# Patient Record
Sex: Male | Born: 1945
Health system: Southern US, Community
[De-identification: ages and names within clinical notes are randomized; demographics above are authoritative.]

## PROBLEM LIST (undated history)

## (undated) DIAGNOSIS — J309 Allergic rhinitis, unspecified: Secondary | ICD-10-CM

## (undated) DIAGNOSIS — E785 Hyperlipidemia, unspecified: Secondary | ICD-10-CM

## (undated) DIAGNOSIS — T7840XA Allergy, unspecified, initial encounter: Secondary | ICD-10-CM

## (undated) DIAGNOSIS — F4312 Post-traumatic stress disorder, chronic: Secondary | ICD-10-CM

## (undated) DIAGNOSIS — G4730 Sleep apnea, unspecified: Secondary | ICD-10-CM

## (undated) DIAGNOSIS — F32A Depression, unspecified: Secondary | ICD-10-CM

## (undated) DIAGNOSIS — H269 Unspecified cataract: Secondary | ICD-10-CM

## (undated) DIAGNOSIS — M1990 Unspecified osteoarthritis, unspecified site: Secondary | ICD-10-CM

## (undated) DIAGNOSIS — E119 Type 2 diabetes mellitus without complications: Secondary | ICD-10-CM

## (undated) DIAGNOSIS — G4733 Obstructive sleep apnea (adult) (pediatric): Principal | ICD-10-CM

## (undated) DIAGNOSIS — F419 Anxiety disorder, unspecified: Secondary | ICD-10-CM

## (undated) DIAGNOSIS — F329 Major depressive disorder, single episode, unspecified: Secondary | ICD-10-CM

## (undated) HISTORY — PX: WISDOM TOOTH EXTRACTION: SHX21

## (undated) HISTORY — DX: Unspecified cataract: H26.9

## (undated) HISTORY — DX: Allergy, unspecified, initial encounter: T78.40XA

## (undated) HISTORY — DX: Obstructive sleep apnea (adult) (pediatric): G47.33

## (undated) HISTORY — DX: Unspecified osteoarthritis, unspecified site: M19.90

## (undated) HISTORY — DX: Sleep apnea, unspecified: G47.30

## (undated) HISTORY — PX: EYE SURGERY: SHX253

## (undated) HISTORY — DX: Type 2 diabetes mellitus without complications: E11.9

## (undated) HISTORY — DX: Allergic rhinitis, unspecified: J30.9

## (undated) HISTORY — DX: Major depressive disorder, single episode, unspecified: F32.9

## (undated) HISTORY — PX: POLYPECTOMY: SHX149

## (undated) HISTORY — DX: Hyperlipidemia, unspecified: E78.5

## (undated) HISTORY — DX: Post-traumatic stress disorder, chronic: F43.12

## (undated) HISTORY — DX: Anxiety disorder, unspecified: F41.9

## (undated) HISTORY — DX: Depression, unspecified: F32.A

## (undated) HISTORY — PX: COLONOSCOPY: SHX174

## (undated) HISTORY — PX: NO PAST SURGERIES: SHX2092

---

## 2002-04-12 ENCOUNTER — Emergency Department (HOSPITAL_COMMUNITY): Admission: EM | Admit: 2002-04-12 | Discharge: 2002-04-12 | Payer: Self-pay | Admitting: Emergency Medicine

## 2002-04-12 ENCOUNTER — Encounter: Payer: Self-pay | Admitting: Emergency Medicine

## 2003-05-15 ENCOUNTER — Encounter: Admission: RE | Admit: 2003-05-15 | Discharge: 2003-05-15 | Payer: Self-pay | Admitting: Internal Medicine

## 2004-04-12 LAB — HM COLONOSCOPY

## 2004-07-21 ENCOUNTER — Encounter: Admission: RE | Admit: 2004-07-21 | Discharge: 2004-10-19 | Payer: Self-pay | Admitting: Internal Medicine

## 2004-07-29 ENCOUNTER — Ambulatory Visit: Payer: Self-pay | Admitting: Gastroenterology

## 2004-08-03 ENCOUNTER — Ambulatory Visit: Payer: Self-pay | Admitting: Gastroenterology

## 2006-06-14 ENCOUNTER — Ambulatory Visit: Payer: Self-pay | Admitting: Family Medicine

## 2006-06-21 ENCOUNTER — Ambulatory Visit: Payer: Self-pay | Admitting: Family Medicine

## 2006-06-21 LAB — CONVERTED CEMR LAB
ALT: 17 units/L (ref 0–40)
AST: 20 units/L (ref 0–37)
BUN: 13 mg/dL (ref 6–23)
Cholesterol: 202 mg/dL (ref 0–200)
Creatinine, Ser: 1.1 mg/dL (ref 0.4–1.5)
Creatinine,U: 131.7 mg/dL
Direct LDL: 127.1 mg/dL
GFR calc Af Amer: 88 mL/min
HDL: 58 mg/dL (ref >39.0)
Microalb, Ur: 0.3 mg/dL (ref 0.0–1.9)
Potassium: 4.3 meq/L (ref 3.5–5.1)
Sodium: 142 meq/L (ref 135–145)
Triglycerides: 74 mg/dL (ref 0–149)
VLDL: 15 mg/dL (ref 0–40)

## 2006-08-24 ENCOUNTER — Ambulatory Visit: Payer: Self-pay | Admitting: Family Medicine

## 2006-08-24 ENCOUNTER — Encounter (INDEPENDENT_AMBULATORY_CARE_PROVIDER_SITE_OTHER): Payer: Self-pay | Admitting: Family Medicine

## 2006-08-24 ENCOUNTER — Encounter: Payer: Self-pay | Admitting: Family Medicine

## 2006-08-24 DIAGNOSIS — E1151 Type 2 diabetes mellitus with diabetic peripheral angiopathy without gangrene: Secondary | ICD-10-CM

## 2006-08-24 DIAGNOSIS — E785 Hyperlipidemia, unspecified: Secondary | ICD-10-CM

## 2006-08-24 LAB — CONVERTED CEMR LAB
AST: 19 units/L (ref 0–37)
Cholesterol: 211 mg/dL (ref 0–200)
Glucose, Bld: 108 mg/dL — ABNORMAL HIGH (ref 70–99)
HDL: 58.2 mg/dL (ref >39.0)
Total CHOL/HDL Ratio: 3.6
VLDL: 27 mg/dL (ref 0–40)

## 2006-08-25 ENCOUNTER — Telehealth (INDEPENDENT_AMBULATORY_CARE_PROVIDER_SITE_OTHER): Payer: Self-pay | Admitting: *Deleted

## 2007-08-03 ENCOUNTER — Encounter (INDEPENDENT_AMBULATORY_CARE_PROVIDER_SITE_OTHER): Payer: Self-pay | Admitting: *Deleted

## 2007-08-03 ENCOUNTER — Telehealth (INDEPENDENT_AMBULATORY_CARE_PROVIDER_SITE_OTHER): Payer: Self-pay | Admitting: *Deleted

## 2007-08-03 ENCOUNTER — Ambulatory Visit: Payer: Self-pay | Admitting: Family Medicine

## 2007-08-03 DIAGNOSIS — J209 Acute bronchitis, unspecified: Secondary | ICD-10-CM

## 2007-08-03 HISTORY — DX: Acute bronchitis, unspecified: J20.9

## 2007-08-03 LAB — CONVERTED CEMR LAB
Inflenza A Ag: NEGATIVE
Rapid Strep: NEGATIVE

## 2007-08-08 ENCOUNTER — Telehealth (INDEPENDENT_AMBULATORY_CARE_PROVIDER_SITE_OTHER): Payer: Self-pay | Admitting: *Deleted

## 2007-08-15 ENCOUNTER — Telehealth (INDEPENDENT_AMBULATORY_CARE_PROVIDER_SITE_OTHER): Payer: Self-pay | Admitting: *Deleted

## 2007-08-16 ENCOUNTER — Encounter: Payer: Self-pay | Admitting: Family Medicine

## 2007-08-30 ENCOUNTER — Ambulatory Visit: Payer: Self-pay | Admitting: Internal Medicine

## 2007-10-10 ENCOUNTER — Telehealth (INDEPENDENT_AMBULATORY_CARE_PROVIDER_SITE_OTHER): Payer: Self-pay | Admitting: *Deleted

## 2007-10-16 ENCOUNTER — Ambulatory Visit: Payer: Self-pay | Admitting: Family Medicine

## 2007-10-16 DIAGNOSIS — F329 Major depressive disorder, single episode, unspecified: Secondary | ICD-10-CM

## 2007-10-16 DIAGNOSIS — F341 Dysthymic disorder: Secondary | ICD-10-CM

## 2007-10-16 DIAGNOSIS — F411 Generalized anxiety disorder: Secondary | ICD-10-CM

## 2007-10-16 HISTORY — DX: Dysthymic disorder: F34.1

## 2007-10-16 HISTORY — DX: Generalized anxiety disorder: F41.1

## 2007-10-19 ENCOUNTER — Telehealth (INDEPENDENT_AMBULATORY_CARE_PROVIDER_SITE_OTHER): Payer: Self-pay | Admitting: *Deleted

## 2007-10-20 ENCOUNTER — Encounter (INDEPENDENT_AMBULATORY_CARE_PROVIDER_SITE_OTHER): Payer: Self-pay | Admitting: *Deleted

## 2007-10-25 ENCOUNTER — Telehealth (INDEPENDENT_AMBULATORY_CARE_PROVIDER_SITE_OTHER): Payer: Self-pay | Admitting: *Deleted

## 2007-10-26 ENCOUNTER — Encounter (INDEPENDENT_AMBULATORY_CARE_PROVIDER_SITE_OTHER): Payer: Self-pay | Admitting: *Deleted

## 2007-10-26 LAB — CONVERTED CEMR LAB
AST: 22 units/L (ref 0–37)
Albumin: 3.5 g/dL (ref 3.5–5.2)
BUN: 12 mg/dL (ref 6–23)
Basophils Absolute: 0 10*3/uL (ref 0.0–0.1)
Basophils Relative: 0.2 % (ref 0.0–1.0)
Calcium: 8.9 mg/dL (ref 8.4–10.5)
Chloride: 105 meq/L (ref 96–112)
Cholesterol: 187 mg/dL (ref 0–200)
Creatinine, Ser: 1 mg/dL (ref 0.4–1.5)
Creatinine,U: 112 mg/dL
Eosinophils Absolute: 0.1 10*3/uL (ref 0.0–0.7)
Eosinophils Relative: 4.2 % (ref 0.0–5.0)
GFR calc Af Amer: 98 mL/min
GFR calc non Af Amer: 81 mL/min
HCT: 41.1 % (ref 39.0–52.0)
Hemoglobin: 14 g/dL (ref 13.0–17.0)
Hgb A1c MFr Bld: 6.5 % — ABNORMAL HIGH (ref 4.6–6.0)
MCHC: 34.1 g/dL (ref 30.0–36.0)
MCV: 84.6 fL (ref 78.0–100.0)
Microalb, Ur: 0.2 mg/dL (ref 0.0–1.9)
Monocytes Absolute: 0.3 10*3/uL (ref 0.1–1.0)
Neutro Abs: 1.5 10*3/uL (ref 1.4–7.7)
RBC: 4.86 M/uL (ref 4.22–5.81)
WBC: 2.8 10*3/uL — ABNORMAL LOW (ref 4.5–10.5)

## 2007-11-06 ENCOUNTER — Ambulatory Visit: Payer: Self-pay | Admitting: Family Medicine

## 2007-11-21 ENCOUNTER — Encounter: Payer: Self-pay | Admitting: Family Medicine

## 2007-11-24 ENCOUNTER — Encounter: Payer: Self-pay | Admitting: Family Medicine

## 2007-12-05 ENCOUNTER — Ambulatory Visit: Payer: Self-pay | Admitting: Family Medicine

## 2007-12-05 DIAGNOSIS — R609 Edema, unspecified: Secondary | ICD-10-CM

## 2007-12-05 HISTORY — DX: Edema, unspecified: R60.9

## 2007-12-15 ENCOUNTER — Encounter: Payer: Self-pay | Admitting: Family Medicine

## 2007-12-20 ENCOUNTER — Ambulatory Visit: Payer: Self-pay | Admitting: Family Medicine

## 2007-12-20 DIAGNOSIS — R413 Other amnesia: Secondary | ICD-10-CM

## 2007-12-26 ENCOUNTER — Encounter: Admission: RE | Admit: 2007-12-26 | Discharge: 2007-12-26 | Payer: Self-pay | Admitting: Family Medicine

## 2007-12-27 ENCOUNTER — Telehealth (INDEPENDENT_AMBULATORY_CARE_PROVIDER_SITE_OTHER): Payer: Self-pay | Admitting: *Deleted

## 2007-12-27 LAB — CONVERTED CEMR LAB
BUN: 14 mg/dL (ref 6–23)
Basophils Absolute: 0 10*3/uL (ref 0.0–0.1)
Creatinine, Ser: 1.3 mg/dL (ref 0.4–1.5)
GFR calc Af Amer: 72 mL/min
GFR calc non Af Amer: 60 mL/min
Hemoglobin: 14.4 g/dL (ref 13.0–17.0)
Lymphocytes Relative: 31 % (ref 12.0–46.0)
MCHC: 34 g/dL (ref 30.0–36.0)
Monocytes Absolute: 0.3 10*3/uL (ref 0.1–1.0)
Neutro Abs: 2.2 10*3/uL (ref 1.4–7.7)
Platelets: 225 10*3/uL (ref 150–400)
Potassium: 4.2 meq/L (ref 3.5–5.1)
RDW: 13 % (ref 11.5–14.6)
Vitamin B-12: 445 pg/mL (ref 211–911)

## 2007-12-28 ENCOUNTER — Encounter (INDEPENDENT_AMBULATORY_CARE_PROVIDER_SITE_OTHER): Payer: Self-pay | Admitting: *Deleted

## 2008-01-16 ENCOUNTER — Telehealth (INDEPENDENT_AMBULATORY_CARE_PROVIDER_SITE_OTHER): Payer: Self-pay | Admitting: *Deleted

## 2008-01-18 ENCOUNTER — Ambulatory Visit: Payer: Self-pay | Admitting: Family Medicine

## 2008-01-18 DIAGNOSIS — R42 Dizziness and giddiness: Secondary | ICD-10-CM | POA: Insufficient documentation

## 2008-01-18 HISTORY — DX: Dizziness and giddiness: R42

## 2008-01-23 ENCOUNTER — Ambulatory Visit: Payer: Self-pay | Admitting: Family Medicine

## 2008-01-23 DIAGNOSIS — R0789 Other chest pain: Secondary | ICD-10-CM

## 2008-01-23 HISTORY — DX: Other chest pain: R07.89

## 2008-01-23 LAB — CONVERTED CEMR LAB
Nitrite: NEGATIVE
Urobilinogen, UA: NEGATIVE

## 2008-01-29 ENCOUNTER — Encounter (INDEPENDENT_AMBULATORY_CARE_PROVIDER_SITE_OTHER): Payer: Self-pay | Admitting: *Deleted

## 2008-01-29 LAB — CONVERTED CEMR LAB
AST: 20 units/L (ref 0–37)
Albumin: 3.4 g/dL — ABNORMAL LOW (ref 3.5–5.2)
Alkaline Phosphatase: 59 units/L (ref 39–117)
BUN: 13 mg/dL (ref 6–23)
Basophils Relative: 0.7 % (ref 0.0–3.0)
Bilirubin, Direct: 0.1 mg/dL (ref 0.0–0.3)
Chloride: 108 meq/L (ref 96–112)
GFR calc non Af Amer: 81 mL/min
Hemoglobin: 13.4 g/dL (ref 13.0–17.0)
Hgb A1c MFr Bld: 6.7 % — ABNORMAL HIGH (ref 4.6–6.0)
LDL Cholesterol: 122 mg/dL — ABNORMAL HIGH (ref 0–99)
Lymphocytes Relative: 24.3 % (ref 12.0–46.0)
Monocytes Relative: 7.1 % (ref 3.0–12.0)
Neutro Abs: 2.3 10*3/uL (ref 1.4–7.7)
Potassium: 4.1 meq/L (ref 3.5–5.1)
RBC: 4.54 M/uL (ref 4.22–5.81)
RDW: 13.6 % (ref 11.5–14.6)
TSH: 1.2 microintl units/mL (ref 0.35–5.50)
Total CHOL/HDL Ratio: 4
Vitamin B-12: 396 pg/mL (ref 211–911)

## 2008-02-08 ENCOUNTER — Ambulatory Visit: Payer: Self-pay

## 2008-02-08 ENCOUNTER — Encounter: Payer: Self-pay | Admitting: Family Medicine

## 2008-02-13 ENCOUNTER — Encounter (INDEPENDENT_AMBULATORY_CARE_PROVIDER_SITE_OTHER): Payer: Self-pay | Admitting: *Deleted

## 2009-05-05 ENCOUNTER — Ambulatory Visit: Payer: Self-pay | Admitting: Family Medicine

## 2009-05-06 ENCOUNTER — Encounter: Payer: Self-pay | Admitting: Family Medicine

## 2009-05-13 LAB — CONVERTED CEMR LAB
Albumin: 3.9 g/dL (ref 3.5–5.2)
BUN: 14 mg/dL (ref 6–23)
Cholesterol: 194 mg/dL (ref 0–200)
Creatinine, Ser: 1.1 mg/dL (ref 0.4–1.5)
GFR calc non Af Amer: 87.34 mL/min (ref >60)
Glucose, Bld: 90 mg/dL (ref 70–99)
HDL: 59.3 mg/dL (ref >39.00)
Hemoglobin: 14.9 g/dL (ref 13.0–17.0)
LDL Cholesterol: 114 mg/dL — ABNORMAL HIGH (ref 0–99)
Lymphocytes Relative: 41 % (ref 12–46)
Lymphs Abs: 1 10*3/uL (ref 0.7–4.0)
MCHC: 33 g/dL (ref 30.0–36.0)
Monocytes Absolute: 0.3 10*3/uL (ref 0.1–1.0)
Monocytes Relative: 11 % (ref 3–12)
Neutro Abs: 1 10*3/uL — ABNORMAL LOW (ref 1.7–7.7)
PSA: 1.14 ng/mL (ref 0.10–4.00)
RBC: 5.21 M/uL (ref 4.22–5.81)
TSH: 0.78 microintl units/mL (ref 0.35–5.50)
Total Bilirubin: 1.3 mg/dL — ABNORMAL HIGH (ref 0.3–1.2)
Triglycerides: 102 mg/dL (ref 0.0–149.0)
VLDL: 20.4 mg/dL (ref 0.0–40.0)
WBC: 2.3 10*3/uL — ABNORMAL LOW (ref 4.0–10.5)

## 2010-05-12 NOTE — Assessment & Plan Note (Signed)
Summary: CPX//PH   Vital Signs:  Patient profile:   65 year old male Height:      68.25 inches Weight:      240 pounds BMI:     36.36 Temp:     98.2 degrees F oral Pulse rate:   82 / minute Pulse rhythm:   regular BP sitting:   122 / 80  (left arm) Cuff size:   large  Vitals Entered By: Army Fossa CMA (May 05, 2009 1:09 PM) CC: CPX.    History of Present Illness: Pt here for cpe and labs.  Pt is seeing a "spine specialist"--- Dr Noel Gerold for ruptured discs.     Type 1 diabetes mellitus follow-up      This is a 65 year old man who presents with Type 2 diabetes mellitus follow-up.  The patient denies polyuria, polydipsia, blurred vision, self managed hypoglycemia, hypoglycemia requiring help, weight loss, weight gain, and numbness of extremities.  The patient denies the following symptoms: neuropathic pain, chest pain, vomiting, orthostatic symptoms, poor wound healing, intermittent claudication, vision loss, and foot ulcer.  Since the last visit the patient reports poor dietary compliance, compliance with medications, not exercising regularly, and not monitoring blood glucose.  Since the last visit, the patient reports having had no eye care and no foot care.    Hyperlipidemia follow-up      The patient also presents for Hyperlipidemia follow-up.  The patient denies muscle aches, GI upset, abdominal pain, flushing, itching, constipation, diarrhea, and fatigue.  The patient denies the following symptoms: chest pain/pressure, exercise intolerance, dypsnea, palpitations, syncope, and pedal edema.  Compliance with medications (by patient report) has been near 100%.  Dietary compliance has been fair.  The patient reports no exercise.    Preventive Screening-Counseling & Management  Alcohol-Tobacco     Alcohol drinks/day: <1     Alcohol type: 4 oz anything     Smoking Status: quit > 6 months     Smoke Cessation Stage: quit     Packs/Day: 2.0     Year Started: 1969     Year Quit:  1982  Caffeine-Diet-Exercise     Caffeine use/day: 0     Does Patient Exercise: no  Hep-HIV-STD-Contraception     Dental Visit-last 6 months no     Dental Care Counseling: to seek dental care; no dental care within six months  Current Medications (verified): 1)  Lamotrigine 100 Mg Tabs (Lamotrigine) .... Take 1 and 1/2 Tablets By Mouth Daily. 2)  Zipsor 25 Mg Caps (Diclofenac Potassium) .... Qid As Needed.  Allergies (verified): No Known Drug Allergies  Past History:  Past Medical History: Last updated: 10/16/2007 Diabetes mellitus, type II Hyperlipidemia Depression Anxiety with panic attacks  Family History: Last updated: 05/05/2009 Family History of Prostate CA 1st degree relative <50 F--PE, prostate CA Family History Hypertension Family History of Stroke F 1st degree relative <60 Sister-- ? cancer S-PE Family History of CAD Male 1st degree relative <60 Family History of Thromboembolism clotting disorder  Social History: Last updated: 05/05/2009 occupation-- unemployed- working on disablity Married Alcohol use-yes Drug use-no Regular exercise-no  Risk Factors: Alcohol Use: <1 (05/05/2009) Caffeine Use: 0 (05/05/2009) Exercise: no (05/05/2009)  Risk Factors: Smoking Status: quit > 6 months (05/05/2009) Packs/Day: 2.0 (05/05/2009)  Family History: Reviewed history and no changes required. Family History of Prostate CA 1st degree relative <50 F--PE, prostate CA Family History Hypertension Family History of Stroke F 1st degree relative <60 Sister-- ? cancer S-PE Family History  of CAD Male 1st degree relative <60 Family History of Thromboembolism clotting disorder  Social History: Reviewed history from 10/16/2007 and no changes required. 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 Eyes:  Denies blurring, discharge, double vision, eye irritation, eye pain, halos, itching, light sensitivity, red eye, vision loss-1 eye, and vision loss-both eyes; + glasses. ENT:  Denies decreased hearing, difficulty swallowing, ear discharge, earache, hoarseness, nasal congestion, nosebleeds, postnasal drainage, ringing in ears, sinus pressure, and sore throat. CV:  Denies bluish discoloration of lips or nails, chest pain or discomfort, difficulty breathing at night, difficulty breathing while lying down, fainting, fatigue, leg cramps with exertion, lightheadness, near fainting, palpitations, shortness of breath with exertion, swelling of feet, swelling of hands, and weight gain. Resp:  Denies chest discomfort, chest pain with inspiration, cough, coughing up blood, excessive snoring, hypersomnolence, morning headaches, pleuritic, shortness of breath, sputum productive, and wheezing. GI:  Denies abdominal pain, bloody stools, change in bowel habits, constipation, dark tarry stools, diarrhea, excessive appetite, gas, hemorrhoids, indigestion, and loss of appetite. GU:  Denies decreased libido, discharge, dysuria, erectile dysfunction, genital sores, hematuria, incontinence, nocturia, urinary frequency, and urinary hesitancy. MS:  Complains of joint pain, loss of strength, and low back pain; denies joint redness, joint swelling, mid back pain, muscle aches, muscle , cramps, muscle weakness, stiffness, and thoracic pain. Derm:  Denies changes in color of skin, changes in nail beds, dryness, excessive perspiration, flushing, hair loss, insect bite(s), itching, lesion(s), poor wound healing, and rash. Neuro:  Denies brief paralysis, difficulty with concentration, disturbances in coordination, falling down, headaches, inability to speak, memory loss,  numbness, poor balance, seizures, sensation of room spinning, tingling, tremors, visual disturbances, and weakness. Psych:  Denies alternate hallucination ( auditory/visual), anxiety, depression, easily angered, easily tearful, irritability, mental problems, panic attacks, sense of great danger, suicidal thoughts/plans, thoughts of violence, unusual visions or sounds, and thoughts /plans of harming others. Endo:  Denies cold intolerance, excessive hunger, excessive thirst, excessive urination, heat intolerance, polyuria, and weight change. Heme:  Denies abnormal bruising, bleeding, enlarge lymph nodes, fevers, pallor, and skin discoloration.  Physical Exam  General:  Well-developed,well-nourished,in no acute distress; alert,appropriate and cooperative throughout examination Head:  Normocephalic and atraumatic without obvious abnormalities. No apparent alopecia or balding. Eyes:  pupils equal, pupils round, pupils reactive to light, and corneas and lenses clear.   Ears:  External ear exam shows no significant lesions or deformities.  Otoscopic examination reveals clear canals, tympanic membranes are intact bilaterally without bulging, retraction, inflammation or discharge. Hearing is grossly normal bilaterally. Nose:  External nasal examination shows no deformity or inflammation. Nasal mucosa are pink and moist without lesions or exudates. Mouth:  Oral mucosa and oropharynx without lesions or exudates.  Teeth in good repair. Neck:  No deformities, masses, or tenderness noted. Chest Wall:  No deformities, masses, tenderness or gynecomastia noted. Lungs:  Normal respiratory effort, chest expands symmetrically. Lungs are clear to auscultation, no crackles or wheezes. Heart:  normal rate and no murmur.   Abdomen:  Bowel sounds positive,abdomen soft and non-tender without masses, organomegaly or hernias noted. Rectal:  REFUSED---SECONDARY TO SEVERE BACK PAIN Genitalia:  Testes bilaterally descended  without nodularity, tenderness  or masses. No scrotal masses or lesions. No penis lesions or urethral discharge. Prostate:  REFUSED--SEE ABOVE Msk:  WEAKNESS IN BOTH LEGS no joint swelling, no joint warmth, and no joint deformities.   Extremities:  No clubbing, cyanosis, edema, or deformity noted with normal full range of motion of all joints.   Neurologic:  alert & oriented  SEE ABOVE Skin:  Intact without suspicious lesions or rashes Cervical Nodes:  No lymphadenopathy noted Psych:  Oriented X3 and normally interactive.     Impression & Recommendations:  Problem # 1:  PREVENTIVE HEALTH CARE (ICD-V70.0)  Orders: EKG w/ Interpretation (93000) Venipuncture (16109) TLB-Lipid Panel (80061-LIPID) TLB-BMP (Basic Metabolic Panel-BMET) (80048-METABOL) TLB-CBC Platelet - w/Differential (85025-CBCD) TLB-Hepatic/Liver Function Pnl (80076-HEPATIC) TLB-TSH (Thyroid Stimulating Hormone) (84443-TSH) TLB-PSA (Prostate Specific Antigen) (84153-PSA)  Problem # 2:  HYPERLIPIDEMIA (ICD-272.4)  The following medications were removed from the medication list:    Zocor 80 Mg Tabs (Simvastatin) .Marland Kitchen... Take one tablet every evening at bedtime.  Orders: EKG w/ Interpretation (93000) Venipuncture (60454) TLB-Lipid Panel (80061-LIPID) TLB-BMP (Basic Metabolic Panel-BMET) (80048-METABOL) TLB-CBC Platelet - w/Differential (85025-CBCD) TLB-Hepatic/Liver Function Pnl (80076-HEPATIC) TLB-TSH (Thyroid Stimulating Hormone) (84443-TSH) TLB-PSA (Prostate Specific Antigen) (84153-PSA)  Problem # 3:  DIABETES MELLITUS, TYPE II (ICD-250.00)  Orders: EKG w/ Interpretation (93000) Venipuncture (09811) TLB-Lipid Panel (80061-LIPID) TLB-BMP (Basic Metabolic Panel-BMET) (80048-METABOL) TLB-CBC Platelet - w/Differential (85025-CBCD) TLB-Hepatic/Liver Function Pnl (80076-HEPATIC) TLB-TSH (Thyroid Stimulating Hormone) (84443-TSH) TLB-PSA (Prostate Specific Antigen) (84153-PSA)  Problem # 4:  ANXIETY DEPRESSION  (ICD-300.4)  Complete Medication List: 1)  Lamotrigine 100 Mg Tabs (Lamotrigine) .... Take 1 and 1/2 tablets by mouth daily. 2)  Zipsor 25 Mg Caps (Diclofenac potassium) .... Qid as needed. 3)  One Touch Delica Lancets Misc (Lancets) .... Accu check two times a day 4)  One Touch Ultra 2  .... Accu check two times a day Prescriptions: ONE TOUCH ULTRA 2 ACCU CHECK two times a day  #60 x 11   Entered and Authorized by:   Loreen Freud DO   Signed by:   Loreen Freud DO on 05/05/2009   Method used:   Printed then faxed to ...         RxID:   [id number] ONE TOUCH DELICA LANCETS  MISC (LANCETS) ACCU CHECK two times a day  #60 x 11   Entered and Authorized by:   Loreen Freud DO   Signed by:   Loreen Freud DO on 05/05/2009   Method used:   Printed then faxed to ...         RxID:   [id number]    EKG  Procedure date:  05/05/2009  Findings:      Normal sinus rhythm with rate of:  96 bpm       Flu Vaccine Result Date:  05/05/2009 Flu Vaccine Result:  refused  Appended Document: CPX//PH    Clinical Lists Changes  Orders: Added new Service order of Tdap => 37yrs IM (40102) - Signed Added new Service order of Admin 1st Vaccine (72536) - Signed Added new Service order of Zoster (Shingles) Vaccine Live (64403) - Signed Added new Service order of Admin of Any Addtl Vaccine (47425) - Signed Observations: Added new observation of ZOSTAVAX LOT: 136oz (05/05/2009 14:26) Added new observation of ZOSTAVAX EXP: 04/26/2010 (05/05/2009 14:26) Added new observation of ZOSTAVAX BY: Army Fossa CMA (05/05/2009 14:26) Added new observation of ZOSTAVAX RTE: [place] (05/05/2009 14:26) Added new observation of ZOSTAVAXDOSE: 0.5 ml (05/05/2009 14:26) Added new observation of ZOSTAVAX MFR: Merck (05/05/2009 14:26) Added  new observation of ZOSTAVAXSITE: right deltoid (05/05/2009 14:26) Added new observation of ZOSTAVAX: Zostavax (05/05/2009 14:26) Added new observation of TD BOOSTERLO:  [id number] (05/05/2009 14:26) Added new observation of TD BOOST EXP: 06/07/2011 (05/05/2009 14:26) Added new observation of TD BOOSTERBY: Danielle Barmer CMA (05/05/2009 14:26) Added new observation of TD BOOSTERRT: IM (05/05/2009 14:26) Added new observation of TDBOOSTERDSE: 0.5 ml (05/05/2009 14:26) Added new observation of TD BOOSTERMF: GlaxoSmithKline (05/05/2009 14:26) Added new observation of TD BOOST SIT: left deltoid (05/05/2009 14:26) Added new observation of TD BOOSTER: Tdap (05/05/2009 14:26)       Immunizations Administered:  Tetanus Vaccine:    Vaccine Type: Tdap    Site: left deltoid    Mfr: GlaxoSmithKline    Dose: 0.5 ml    Route: IM    Given by: Army Fossa CMA    Exp. Date: 06/07/2011    Lot #: [id number]  Zostavax # 1:    Vaccine Type: Zostavax    Site: right deltoid    Mfr: Merck    Dose: 0.5 ml    Route:     Given by: Army Fossa CMA    Exp. Date: 04/26/2010    Lot #: [id number]  Appended Document: CPX//PH  Laboratory Results   Urine Tests   Date/Time Reported: May 05, 2009 2:45 PM   Routine Urinalysis   Color: yellow Appearance: Clear Glucose: negative   (Normal Range: Negative) Bilirubin: negative   (Normal Range: Negative) Ketone: negative   (Normal Range: Negative) Spec. Gravity: 1.015   (Normal Range: 1.003-1.035) Blood: negative   (Normal Range: Negative) pH: 5.0   (Normal Range: 5.0-8.0) Protein: negative   (Normal Range: Negative) Urobilinogen: negative   (Normal Range: 0-1) Nitrite: negative   (Normal Range: Negative) Leukocyte Esterace: negative   (Normal Range: Negative)    Comments: Floydene Flock  May 05, 2009 2:45 PM      Appended Document: Orders Update    Clinical Lists Changes  Orders: Added new Test order of T- * Misc. Laboratory test [phone]) - Signed

## 2010-09-07 ENCOUNTER — Emergency Department (HOSPITAL_COMMUNITY)
Admission: EM | Admit: 2010-09-07 | Discharge: 2010-09-07 | Disposition: A | Discharge status: Home / Self Care | Payer: 59 | Attending: Emergency Medicine | Admitting: Emergency Medicine

## 2010-09-07 DIAGNOSIS — W57XXXA Bitten or stung by nonvenomous insect and other nonvenomous arthropods, initial encounter: Secondary | ICD-10-CM | POA: Insufficient documentation

## 2010-09-07 DIAGNOSIS — L299 Pruritus, unspecified: Secondary | ICD-10-CM | POA: Insufficient documentation

## 2010-09-07 DIAGNOSIS — S30860A Insect bite (nonvenomous) of lower back and pelvis, initial encounter: Secondary | ICD-10-CM | POA: Insufficient documentation

## 2011-05-28 ENCOUNTER — Encounter: Payer: 59 | Admitting: Family Medicine

## 2012-08-31 ENCOUNTER — Encounter: Payer: 59 | Admitting: Family Medicine

## 2012-10-17 ENCOUNTER — Encounter: Payer: 59 | Admitting: Family Medicine

## 2012-10-17 DIAGNOSIS — Z0289 Encounter for other administrative examinations: Secondary | ICD-10-CM

## 2013-02-01 ENCOUNTER — Encounter: Payer: Self-pay | Admitting: Family Medicine

## 2013-02-01 ENCOUNTER — Ambulatory Visit (INDEPENDENT_AMBULATORY_CARE_PROVIDER_SITE_OTHER): Payer: BC Managed Care – PPO | Admitting: Family Medicine

## 2013-02-01 VITALS — BP 140/90 | HR 97 | Temp 98.5°F | Wt 260.2 lb

## 2013-02-01 DIAGNOSIS — M436 Torticollis: Secondary | ICD-10-CM

## 2013-02-01 DIAGNOSIS — E669 Obesity, unspecified: Secondary | ICD-10-CM | POA: Insufficient documentation

## 2013-02-01 MED ORDER — CYCLOBENZAPRINE HCL 10 MG PO TABS: 10.0000 mg | ORAL_TABLET | Freq: Three times a day (TID) | ORAL | Status: DC | PRN

## 2013-02-01 NOTE — Patient Instructions (Signed)
Torticollis, Acute °You have suddenly (acutely) developed a twisted neck (torticollis). This is usually a self-limited condition. °CAUSES  °Acute torticollis may be caused by malposition, trauma or infection. Most commonly, acute torticollis is caused by sleeping in an awkward position. Torticollis may also be caused by the flexion, extension or twisting of the neck muscles beyond their normal position. Sometimes, the exact cause may not be known. °SYMPTOMS  °Usually, there is pain and limited movement of the neck. Your neck may twist to one side. °DIAGNOSIS  °The diagnosis is often made by physical examination. X-rays, CT scans or MRIs may be done if there is a history of trauma or concern of infection. °TREATMENT  °For a common, stiff neck that develops during sleep, treatment is focused on relaxing the contracted neck muscle. Medications (including shots) may be used to treat the problem. Most cases resolve in several days. Torticollis usually responds to conservative physical therapy. If left untreated, the shortened and spastic neck muscle can cause deformities in the face and neck. Rarely, surgery is required. °HOME CARE INSTRUCTIONS  °· Use over-the-counter and prescription medications as directed by your caregiver. °· Do stretching exercises and massage the neck as directed by your caregiver. °· Follow up with physical therapy if needed and as directed by your caregiver. °SEEK IMMEDIATE MEDICAL CARE IF:  °· You develop difficulty breathing or noisy breathing (stridor). °· You drool, develop trouble swallowing or have pain with swallowing. °· You develop numbness or weakness in the hands or feet. °· You have changes in speech or vision. °· You have problems with urination or bowel movements. °· You have difficulty walking. °· You have a fever. °· You have increased pain. °MAKE SURE YOU:  °· Understand these instructions. °· Will watch your condition. °· Will get help right away if you are not doing well or  get worse. °Document Released: 03/26/2000 Document Revised: 06/21/2011 Document Reviewed: 05/07/2009 °[employer]® Patient Information ©2014 [employer]. ° °

## 2013-02-03 ENCOUNTER — Encounter: Payer: Self-pay | Admitting: Family Medicine

## 2013-02-03 NOTE — Progress Notes (Signed)
  Subjective:     Calvin Chandler is a 67 y.o. male who presents for evaluation of neck pain. Event that precipitated these symptoms: none known. Onset of symptoms was 3 weeks ago, and have been gradually worsening since that time. Current symptoms are stiffness in neck. Patient denies numbness in arm, paresthesias in arm and weakness in neck and arm. Patient has had no prior neck problems. Previous treatments: none.  The following portions of the patient's history were reviewed and updated as appropriate: allergies, current medications, past family history, past medical history, past social history, past surgical history and problem list.  Review of Systems Pertinent items are noted in HPI.    Objective:    BP 140/90  Pulse 97  Temp(Src) 98.5 F (36.9 C) (Oral)  Wt 260 lb 3.2 oz (118.026 kg)  BMI 39.25 kg/m2  SpO2 97% General:   alert, cooperative, appears stated age and no distress  External Deformity:  absent  ROM Cervical Spine:  normal range of motion and left rotation to 45 degrees  Midline Tenderness:  absent midline  Paraspinous tenderness:  moderate on the right  UE Neurologic Exam:  unremarkable   X-ray of the cervical spine: Not indicated    Assessment:    Torticollis    Plan:    Discussed the cervical pain, its course and treatment. Agricultural engineer distributed. Discussed appropriate use of ice and heat. NSAIDs per medication orders. Muscle relaxants started per medication orders. Suggested chiropractic. f/u prn

## 2013-04-23 ENCOUNTER — Telehealth: Payer: Self-pay

## 2013-04-23 NOTE — Telephone Encounter (Signed)
Unable to reach, invalid information.  Tdap-05/05/09 Z- 05/05/09 PSA- 05/05/09- 1.14

## 2013-04-24 ENCOUNTER — Encounter: Payer: Self-pay | Admitting: Family Medicine

## 2013-04-24 ENCOUNTER — Ambulatory Visit (INDEPENDENT_AMBULATORY_CARE_PROVIDER_SITE_OTHER): Payer: BC Managed Care – PPO | Admitting: Family Medicine

## 2013-04-24 VITALS — BP 124/86 | HR 81 | Temp 97.8°F | Ht 69.0 in | Wt 262.0 lb

## 2013-04-24 DIAGNOSIS — Z125 Encounter for screening for malignant neoplasm of prostate: Secondary | ICD-10-CM

## 2013-04-24 DIAGNOSIS — R109 Unspecified abdominal pain: Secondary | ICD-10-CM

## 2013-04-24 DIAGNOSIS — E785 Hyperlipidemia, unspecified: Secondary | ICD-10-CM

## 2013-04-24 DIAGNOSIS — E119 Type 2 diabetes mellitus without complications: Secondary | ICD-10-CM

## 2013-04-24 DIAGNOSIS — Z0000 Encounter for general adult medical examination without abnormal findings: Secondary | ICD-10-CM

## 2013-04-24 DIAGNOSIS — R10A2 Flank pain, left side: Secondary | ICD-10-CM

## 2013-04-24 DIAGNOSIS — Z23 Encounter for immunization: Secondary | ICD-10-CM

## 2013-04-24 HISTORY — DX: Unspecified abdominal pain: R10.9

## 2013-04-24 HISTORY — DX: Flank pain, left side: R10.A2

## 2013-04-24 LAB — MICROALBUMIN / CREATININE URINE RATIO
CREATININE, U: 212.2 mg/dL
MICROALB UR: 0.6 mg/dL (ref 0.0–1.9)
Microalb Creat Ratio: 0.3 mg/g (ref 0.0–30.0)

## 2013-04-24 NOTE — Patient Instructions (Signed)
Preventive Care for Adults, Male A healthy lifestyle and preventive care can promote health and wellness. Preventive health guidelines for men include the following key practices:  A routine yearly physical is a good way to check with your health care provider about your health and preventative screening. It is a chance to share any concerns and updates on your health and to receive a thorough exam.  Visit your dentist for a routine exam and preventative care every 6 months. Brush your teeth twice a day and floss once a day. Good oral hygiene prevents tooth decay and gum disease.  The frequency of eye exams is based on your age, health, family medical history, use of contact lenses, and other factors. Follow your health care provider's recommendations for frequency of eye exams.  Eat a healthy diet. Foods such as vegetables, fruits, whole grains, low-fat dairy products, and lean protein foods contain the nutrients you need without too many calories. Decrease your intake of foods high in solid fats, added sugars, and salt. Eat the right amount of calories for you.Get information about a proper diet from your health care provider, if necessary.  Regular physical exercise is one of the most important things you can do for your health. Most adults should get at least 150 minutes of moderate-intensity exercise (any activity that increases your heart rate and causes you to sweat) each week. In addition, most adults need muscle-strengthening exercises on 2 or more days a week.  Maintain a healthy weight. The body mass index (BMI) is a screening tool to identify possible weight problems. It provides an estimate of body fat based on height and weight. Your health care provider can find your BMI and can help you achieve or maintain a healthy weight.For adults 20 years and older:  A BMI below 18.5 is considered underweight.  A BMI of 18.5 to 24.9 is normal.  A BMI of 25 to 29.9 is considered  overweight.  A BMI of 30 and above is considered obese.  Maintain normal blood lipids and cholesterol levels by exercising and minimizing your intake of saturated fat. Eat a balanced diet with plenty of fruit and vegetables. Blood tests for lipids and cholesterol should begin at age 42 and be repeated every 5 years. If your lipid or cholesterol levels are high, you are over 50, or you are at high risk for heart disease, you may need your cholesterol levels checked more frequently.Ongoing high lipid and cholesterol levels should be treated with medicines if diet and exercise are not working.  If you smoke, find out from your health care provider how to quit. If you do not use tobacco, do not start.  Lung cancer screening is recommended for adults aged 24 80 years who are at high risk for developing lung cancer because of a history of smoking. A yearly low-dose CT scan of the lungs is recommended for people who have at least a 30-pack-year history of smoking and are a current smoker or have quit within the past 15 years. A pack year of smoking is smoking an average of 1 pack of cigarettes a day for 1 year (for example: 1 pack a day for 30 years or 2 packs a day for 15 years). Yearly screening should continue until the smoker has stopped smoking for at least 15 years. Yearly screening should be stopped for people who develop a health problem that would prevent them from having lung cancer treatment.  If you choose to drink alcohol, do not have  more than 2 drinks per day. One drink is considered to be 12 ounces (355 mL) of beer, 5 ounces (148 mL) of wine, or 1.5 ounces (44 mL) of liquor.  Avoid use of street drugs. Do not share needles with anyone. Ask for help if you need support or instructions about stopping the use of drugs.  High blood pressure causes heart disease and increases the risk of stroke. Your blood pressure should be checked at least every 1 2 years. Ongoing high blood pressure should be  treated with medicines, if weight loss and exercise are not effective.  If you are 75 66 years old, ask your health care provider if you should take aspirin to prevent heart disease.  Diabetes screening involves taking a blood sample to check your fasting blood sugar level. This should be done once every 3 years, after age 19, if you are within normal weight and without risk factors for diabetes. Testing should be considered at a younger age or be carried out more frequently if you are overweight and have at least 1 risk factor for diabetes.  Colorectal cancer can be detected and often prevented. Most routine colorectal cancer screening begins at the age of 47 and continues through age 80. However, your health care provider may recommend screening at an earlier age if you have risk factors for colon cancer. On a yearly basis, your health care provider may provide home test kits to check for hidden blood in the stool. Use of a small camera at the end of a tube to directly examine the colon (sigmoidoscopy or colonoscopy) can detect the earliest forms of colorectal cancer. Talk to your health care provider about this at age 66, when routine screening begins. Direct exam of the colon should be repeated every 5 10 years through age 19, unless early forms of precancerous polyps or small growths are found.  People who are at an increased risk for hepatitis B should be screened for this virus. You are considered at high risk for hepatitis B if:  You were born in a country where hepatitis B occurs often. Talk with your health care provider about which countries are considered high-risk.  Your parents were born in a high-risk country and you have not received a shot to protect against hepatitis B (hepatitis B vaccine).  You have HIV or AIDS.  You use needles to inject street drugs.  You live with, or have sex with, someone who has hepatitis B.  You are a man who has sex with other men (MSM).  You get  hemodialysis treatment.  You take certain medicines for conditions such as cancer, organ transplantation, and autoimmune conditions.  Hepatitis C blood testing is recommended for all people born from [birth year] through 1965 and any individual with known risks for hepatitis C.  Practice safe sex. Use condoms and avoid high-risk sexual practices to reduce the spread of sexually transmitted infections (STIs). STIs include gonorrhea, chlamydia, syphilis, trichomonas, herpes, HPV, and human immunodeficiency virus (HIV). Herpes, HIV, and HPV are viral illnesses that have no cure. They can result in disability, cancer, and death.  A one-time screening for abdominal aortic aneurysm (AAA) and surgical repair of large AAAs by ultrasound are recommended for men ages 94 to 74 years who are current or former smokers.  Healthy men should no longer receive prostate-specific antigen (PSA) blood tests as part of routine cancer screening. Talk with your health care provider about prostate cancer screening.  Testicular cancer screening is not recommended  for adult males who have no symptoms. Screening includes self-exam, a health care provider exam, and other screening tests. Consult with your health care provider about any symptoms you have or any concerns you have about testicular cancer.  Use sunscreen. Apply sunscreen liberally and repeatedly throughout the day. You should seek shade when your shadow is shorter than you. Protect yourself by wearing long sleeves, pants, a wide-brimmed hat, and sunglasses year round, whenever you are outdoors.  Once a month, do a whole-body skin exam, using a mirror to look at the skin on your back. Tell your health care provider about new moles, moles that have irregular borders, moles that are larger than a pencil eraser, or moles that have changed in shape or color.  Stay current with required vaccines (immunizations).  Influenza vaccine. All adults should be immunized every  year.  Tetanus, diphtheria, and acellular pertussis (Td, Tdap) vaccine. An adult who has not previously received Tdap or who does not know his vaccine status should receive 1 dose of Tdap. This initial dose should be followed by tetanus and diphtheria toxoids (Td) booster doses every 10 years. Adults with an unknown or incomplete history of completing a 3-dose immunization series with Td-containing vaccines should begin or complete a primary immunization series including a Tdap dose. Adults should receive a Td booster every 10 years.  Varicella vaccine. An adult without evidence of immunity to varicella should receive 2 doses or a second dose if he has previously received 1 dose.  Human papillomavirus (HPV) vaccine. Males aged 44 21 years who have not received the vaccine previously should receive the 3-dose series. Males aged 43 26 years may be immunized. Immunization is recommended through the age of 50 years for any male who has sex with males and did not get any or all doses earlier. Immunization is recommended for any person with an immunocompromised condition through the age of 23 years if he did not get any or all doses earlier. During the 3-dose series, the second dose should be obtained 4 8 weeks after the first dose. The third dose should be obtained 24 weeks after the first dose and 16 weeks after the second dose.  Zoster vaccine. One dose is recommended for adults aged 96 years or older unless certain conditions are present.  Measles, mumps, and rubella (MMR) vaccine. Adults born before 55 generally are considered immune to measles and mumps. Adults born in [birth year] or later should have 1 or more doses of MMR vaccine unless there is a contraindication to the vaccine or there is laboratory evidence of immunity to each of the three diseases. A routine second dose of MMR vaccine should be obtained at least 28 days after the first dose for students attending postsecondary schools, health care  workers, or international travelers. People who received inactivated measles vaccine or an unknown type of measles vaccine during 1963 1967 should receive 2 doses of MMR vaccine. People who received inactivated mumps vaccine or an unknown type of mumps vaccine before 1979 and are at high risk for mumps infection should consider immunization with 2 doses of MMR vaccine. Unvaccinated health care workers born before [birth year] who lack laboratory evidence of measles, mumps, or rubella immunity or laboratory confirmation of disease should consider measles and mumps immunization with 2 doses of MMR vaccine or rubella immunization with 1 dose of MMR vaccine.  Pneumococcal 13-valent conjugate (PCV13) vaccine. When indicated, a person who is uncertain of his immunization history and has no record of immunization  should receive the PCV13 vaccine. An adult aged 67 years or older who has certain medical conditions and has not been previously immunized should receive 1 dose of PCV13 vaccine. This PCV13 should be followed with a dose of pneumococcal polysaccharide (PPSV23) vaccine. The PPSV23 vaccine dose should be obtained at least 8 weeks after the dose of PCV13 vaccine. An adult aged 79 years or older who has certain medical conditions and previously received 1 or more doses of PPSV23 vaccine should receive 1 dose of PCV13. The PCV13 vaccine dose should be obtained 1 or more years after the last PPSV23 vaccine dose.  Pneumococcal polysaccharide (PPSV23) vaccine. When PCV13 is also indicated, PCV13 should be obtained first. All adults aged 74 years and older should be immunized. An adult younger than age 50 years who has certain medical conditions should be immunized. Any person who resides in a nursing home or long-term care facility should be immunized. An adult smoker should be immunized. People with an immunocompromised condition and certain other conditions should receive both PCV13 and PPSV23 vaccines. People with human  immunodeficiency virus (HIV) infection should be immunized as soon as possible after diagnosis. Immunization during chemotherapy or radiation therapy should be avoided. Routine use of PPSV23 vaccine is not recommended for American Indians, [place] Natives, or people younger than 65 years unless there are medical conditions that require PPSV23 vaccine. When indicated, people who have unknown immunization and have no record of immunization should receive PPSV23 vaccine. One-time revaccination 5 years after the first dose of PPSV23 is recommended for people aged 41 64 years who have chronic kidney failure, nephrotic syndrome, asplenia, or immunocompromised conditions. People who received 1 2 doses of PPSV23 before age 15 years should receive another dose of PPSV23 vaccine at age 48 years or later if at least 5 years have passed since the previous dose. Doses of PPSV23 are not needed for people immunized with PPSV23 at or after age 69 years.  Meningococcal vaccine. Adults with asplenia or persistent complement component deficiencies should receive 2 doses of quadrivalent meningococcal conjugate (MenACWY-D) vaccine. The doses should be obtained at least 2 months apart. Microbiologists working with certain meningococcal bacteria, [place] recruits, people at risk during an outbreak, and people who travel to or live in countries with a high rate of meningitis should be immunized. A first-year college student up through age 7 years who is living in a residence hall should receive a dose if he did not receive a dose on or after his 16th birthday. Adults who have certain high-risk conditions should receive one or more doses of vaccine.  Hepatitis A vaccine. Adults who wish to be protected from this disease, have certain high-risk conditions, work with hepatitis A-infected animals, work in hepatitis A research labs, or travel to or work in countries with a high rate of hepatitis A should be immunized. Adults who were  previously unvaccinated and who anticipate close contact with an international adoptee during the first 60 days after arrival in the [place] from a country with a high rate of hepatitis A should be immunized.  Hepatitis B vaccine. Adults who wish to be protected from this disease, have certain high-risk conditions, may be exposed to blood or other infectious body fluids, are household contacts or sex partners of hepatitis B positive people, are clients or workers in certain care facilities, or travel to or work in countries with a high rate of hepatitis B should be immunized.  Haemophilus influenzae type b (Hib) vaccine. A  previously unvaccinated person with asplenia or sickle cell disease or having a scheduled splenectomy should receive 1 dose of Hib vaccine. Regardless of previous immunization, a recipient of a hematopoietic stem cell transplant should receive a 3-dose series 6 12 months after his successful transplant. Hib vaccine is not recommended for adults with HIV infection. Preventive Service / Frequency Ages 62 to 3  Blood pressure check.** / Every 1 to 2 years.  Lipid and cholesterol check.** / Every 5 years beginning at age 43.  Hepatitis C blood test.** / For any individual with known risks for hepatitis C.  Skin self-exam. / Monthly.  Influenza vaccine. / Every year.  Tetanus, diphtheria, and acellular pertussis (Tdap, Td) vaccine.** / Consult your health care provider. 1 dose of Td every 10 years.  Varicella vaccine.** / Consult your health care provider.  HPV vaccine. / 3 doses over 6 months, if 48 or younger.  Measles, mumps, rubella (MMR) vaccine.** / You need at least 1 dose of MMR if you were born in 1957 or later. You may also need a second dose.  Pneumococcal 13-valent conjugate (PCV13) vaccine.** / Consult your health care provider.  Pneumococcal polysaccharide (PPSV23) vaccine.** / 1 to 2 doses if you smoke cigarettes or if you have certain  conditions.  Meningococcal vaccine.** / 1 dose if you are age 8 to 70 years and a [employer] researcher living in a residence hall, or have one of several medical conditions. You may also need additional booster doses.  Hepatitis A vaccine.** / Consult your health care provider.  Hepatitis B vaccine.** / Consult your health care provider.  Haemophilus influenzae type b (Hib) vaccine.** / Consult your health care provider. Ages 48 to 32  Blood pressure check.** / Every 1 to 2 years.  Lipid and cholesterol check.** / Every 5 years beginning at age 38.  Lung cancer screening. / Every year if you are aged 40 80 years and have a 30-pack-year history of smoking and currently smoke or have quit within the past 15 years. Yearly screening is stopped once you have quit smoking for at least 15 years or develop a health problem that would prevent you from having lung cancer treatment.  Fecal occult blood test (FOBT) of stool. / Every year beginning at age 4 and continuing until age 70. You may not have to do this test if you get a colonoscopy every 10 years.  Flexible sigmoidoscopy** or colonoscopy.** / Every 5 years for a flexible sigmoidoscopy or every 10 years for a colonoscopy beginning at age 76 and continuing until age 62.  Hepatitis C blood test.** / For all people born from [birth year] through 1965 and any individual with known risks for hepatitis C.  Skin self-exam. / Monthly.  Influenza vaccine. / Every year.  Tetanus, diphtheria, and acellular pertussis (Tdap/Td) vaccine.** / Consult your health care provider. 1 dose of Td every 10 years.  Varicella vaccine.** / Consult your health care provider.  Zoster vaccine.** / 1 dose for adults aged 60 years or older.  Measles, mumps, rubella (MMR) vaccine.** / You need at least 1 dose of MMR if you were born in 1957 or later. You may also need a second dose.  Pneumococcal 13-valent conjugate (PCV13) vaccine.** / Consult your health care  provider.  Pneumococcal polysaccharide (PPSV23) vaccine.** / 1 to 2 doses if you smoke cigarettes or if you have certain conditions.  Meningococcal vaccine.** / Consult your health care provider.  Hepatitis A vaccine.** / Consult your health care  provider.  Hepatitis B vaccine.** / Consult your health care provider.  Haemophilus influenzae type b (Hib) vaccine.** / Consult your health care provider. Ages 65 and over  Blood pressure check.** / Every 1 to 2 years.  Lipid and cholesterol check.**/ Every 5 years beginning at age 20.  Lung cancer screening. / Every year if you are aged 55 80 years and have a 30-pack-year history of smoking and currently smoke or have quit within the past 15 years. Yearly screening is stopped once you have quit smoking for at least 15 years or develop a health problem that would prevent you from having lung cancer treatment.  Fecal occult blood test (FOBT) of stool. / Every year beginning at age 50 and continuing until age 75. You may not have to do this test if you get a colonoscopy every 10 years.  Flexible sigmoidoscopy** or colonoscopy.** / Every 5 years for a flexible sigmoidoscopy or every 10 years for a colonoscopy beginning at age 50 and continuing until age 75.  Hepatitis C blood test.** / For all people born from 1945 through 1965 and any individual with known risks for hepatitis C.  Abdominal aortic aneurysm (AAA) screening.** / A one-time screening for ages 65 to 75 years who are current or former smokers.  Skin self-exam. / Monthly.  Influenza vaccine. / Every year.  Tetanus, diphtheria, and acellular pertussis (Tdap/Td) vaccine.** / 1 dose of Td every 10 years.  Varicella vaccine.** / Consult your health care provider.  Zoster vaccine.** / 1 dose for adults aged 60 years or older.  Pneumococcal 13-valent conjugate (PCV13) vaccine.** / Consult your health care provider.  Pneumococcal polysaccharide (PPSV23) vaccine.** / 1 dose for all  adults aged 65 years and older.  Meningococcal vaccine.** / Consult your health care provider.  Hepatitis A vaccine.** / Consult your health care provider.  Hepatitis B vaccine.** / Consult your health care provider.  Haemophilus influenzae type b (Hib) vaccine.** / Consult your health care provider. **Family history and personal history of risk and conditions may change your health care provider's recommendations. Document Released: 05/25/2001 Document Revised: 01/17/2013 Document Reviewed: 08/24/2010 [employer] Patient Information 2014 [employer].  

## 2013-04-24 NOTE — Progress Notes (Signed)
Pre visit review using our clinic review tool, if applicable. No additional management support is needed unless otherwise documented below in the visit note. 

## 2013-04-24 NOTE — Progress Notes (Signed)
Subjective:    Calvin Chandler is a 68 y.o. male who presents for a welcome to Medicare exam.   Cardiac risk factors: advanced age (older than 34 for men, 57 for women), diabetes mellitus, dyslipidemia, male gender, obesity (BMI >= 30 kg/m2) and sedentary lifestyle.  Depression Screen (Note: if answer to either of the following is "Yes", a more complete depression screening is indicated)  Q1: Over the past two weeks, have you felt down, depressed or hopeless? no Q2: Over the past two weeks, have you felt little interest or pleasure in doing things? no  Activities of Daily Living In your present state of health, do you have any difficulty performing the following activities?:  Preparing food and eating?: No Bathing yourself: No Getting dressed: No Using the toilet:No Moving around from place to place: No In the past year have you fallen or had a near fall?:No  Current exercise habits: The patient does not participate in regular exercise at present.  Dietary issues discussed: na  Hearing difficulties: No Safe in current home environment: yes  The following portions of the patient's history were reviewed and updated as appropriate:  He  has a past medical history of Anxiety; Depression; Diabetes mellitus without complication; and Hyperlipidemia. He  does not have any pertinent problems on file. He  has past surgical history that includes No past surgeries. His family history includes Clotting disorder in an other family member; Hypertension in an other family member; Prostate cancer in his father; Pulmonary embolism in his father and sister; Stroke in an other family member. He  reports that he has never smoked. He has never used smokeless tobacco. He reports that he does not drink alcohol or use illicit drugs. He has a current medication list which includes the following prescription(s): ibuprofen. No current outpatient prescriptions on file prior to visit.   No current  facility-administered medications on file prior to visit.   He has No Known Allergies.. Review of Systems  Review of Systems  Constitutional: Negative for activity change, appetite change and fatigue.  HENT: Negative for hearing loss, congestion, tinnitus and ear discharge.   Eyes: Negative for visual disturbance (see optho --last time over 3 years ago) Respiratory: Negative for cough, chest tightness and shortness of breath.   Cardiovascular: Negative for chest pain, palpitations and leg swelling.  Gastrointestinal: Negative for abdominal pain, diarrhea, constipation and abdominal distention.  Genitourinary: Negative for urgency, frequency, decreased urine volume and difficulty urinating.  Musculoskeletal: Negative for back pain, arthralgias and gait problem.  Skin: Negative for color change, pallor and rash.  Neurological: Negative for dizziness, light-headedness, numbness and headaches.  Hematological: Negative for adenopathy. Does not bruise/bleed easily.  Psychiatric/Behavioral: Negative for suicidal ideas, confusion, sleep disturbance, self-injury, dysphoric mood, decreased concentration and agitation.  Pt is able to read and write and can do all ADLs No risk for falling No abuse/ violence in home      Objective:     Vision by Snellen chart: right eye:20/25, left eye:20/25 Blood pressure 124/86, pulse 81, temperature 97.8 F (36.6 C), temperature source Oral, height 5\' 9"  (1.753 m), weight 262 lb (118.842 kg), SpO2 96.00%. Body mass index is 38.67 kg/(m^2). BP 124/86  Pulse 81  Temp(Src) 97.8 F (36.6 C) (Oral)  Ht 5\' 9"  (1.753 m)  Wt 262 lb (118.842 kg)  BMI 38.67 kg/m2  SpO2 96% General appearance: alert, cooperative, appears stated age and no distress Head: Normocephalic, without obvious abnormality, atraumatic Eyes: negative findings: lids and lashes  normal and pupils equal, round, reactive to light and accomodation Ears: normal TM's and external ear canals both  ears Nose: Nares normal. Septum midline. Mucosa normal. No drainage or sinus tenderness. Throat: lips, mucosa, and tongue normal; teeth and gums normal Neck: no adenopathy, no carotid bruit, no JVD, supple, symmetrical, trachea midline and thyroid not enlarged, symmetric, no tenderness/mass/nodules Back: symmetric, no curvature. ROM normal. No CVA tenderness. Lungs: clear to auscultation bilaterally Chest wall: no tenderness Heart: regular rate and rhythm, S1, S2 normal, no murmur, click, rub or gallop Abdomen: soft, non-tender; bowel sounds normal; no masses,  no organomegaly Male genitalia: normal, penis: no lesions or discharge. testes: no masses or tenderness. no hernias Rectal: soft brown guaiac negative stool noted and the prostate is on normal consistency without nodules and is slightly enlarged Extremities: extremities normal, atraumatic, no cyanosis or edema Pulses: 2+ and symmetric Skin: Skin color, texture, turgor normal. No rashes or lesions Lymph nodes: Cervical, supraclavicular, and axillary nodes normal. Neurologic: Alert and oriented X 3, normal strength and tone. Normal symmetric reflexes. Normal coordination and gait Psych-no depression, no anxiety      Assessment:     cpe      Plan:     During the course of the visit the patient was educated and counseled about appropriate screening and preventive services including:   Pneumococcal vaccine   Influenza vaccine  Td vaccine  Screening electrocardiogram  Prostate cancer screening  Colorectal cancer screening  Glaucoma screening  Advanced directives: has NO advanced directive  - add't info requested. Referral to SW: no  Patient Instructions (the written plan) was given to the patient.

## 2013-04-25 ENCOUNTER — Telehealth: Payer: Self-pay

## 2013-04-25 LAB — LIPID PANEL
CHOL/HDL RATIO: 3
Cholesterol: 191 mg/dL (ref 0–200)
HDL: 58.4 mg/dL (ref >39.00)
LDL CALC: 114 mg/dL — AB (ref 0–99)
Triglycerides: 91 mg/dL (ref 0.0–149.0)
VLDL: 18.2 mg/dL (ref 0.0–40.0)

## 2013-04-25 LAB — CBC WITH DIFFERENTIAL/PLATELET
BASOS ABS: 0 10*3/uL (ref 0.0–0.1)
Basophils Relative: 0.2 % (ref 0.0–3.0)
EOS ABS: 0.1 10*3/uL (ref 0.0–0.7)
Eosinophils Relative: 1.6 % (ref 0.0–5.0)
HEMATOCRIT: 41.6 % (ref 39.0–52.0)
Hemoglobin: 13.9 g/dL (ref 13.0–17.0)
Lymphocytes Relative: 29 % (ref 12.0–46.0)
Lymphs Abs: 1.2 10*3/uL (ref 0.7–4.0)
MCHC: 33.5 g/dL (ref 30.0–36.0)
MCV: 85.7 fl (ref 78.0–100.0)
MONO ABS: 0.3 10*3/uL (ref 0.1–1.0)
MONOS PCT: 7.3 % (ref 3.0–12.0)
NEUTROS ABS: 2.5 10*3/uL (ref 1.4–7.7)
Neutrophils Relative %: 61.9 % (ref 43.0–77.0)
PLATELETS: 232 10*3/uL (ref 150.0–400.0)
RBC: 4.86 Mil/uL (ref 4.22–5.81)
RDW: 14.1 % (ref 11.5–14.6)
WBC: 4 10*3/uL — ABNORMAL LOW (ref 4.5–10.5)

## 2013-04-25 LAB — PSA: PSA: 1.38 ng/mL (ref 0.10–4.00)

## 2013-04-25 LAB — HEPATIC FUNCTION PANEL
ALT: 19 U/L (ref 0–53)
AST: 20 U/L (ref 0–37)
Albumin: 3.7 g/dL (ref 3.5–5.2)
Alkaline Phosphatase: 75 U/L (ref 39–117)
BILIRUBIN DIRECT: 0.2 mg/dL (ref 0.0–0.3)
BILIRUBIN TOTAL: 1.2 mg/dL (ref 0.3–1.2)
Total Protein: 6.8 g/dL (ref 6.0–8.3)

## 2013-04-25 LAB — BASIC METABOLIC PANEL
BUN: 13 mg/dL (ref 6–23)
CO2: 26 meq/L (ref 19–32)
Calcium: 9 mg/dL (ref 8.4–10.5)
Chloride: 106 mEq/L (ref 96–112)
Creatinine, Ser: 1.1 mg/dL (ref 0.4–1.5)
GFR: 86.24 mL/min (ref >60.00)
Glucose, Bld: 111 mg/dL — ABNORMAL HIGH (ref 70–99)
Potassium: 3.9 mEq/L (ref 3.5–5.1)
Sodium: 138 mEq/L (ref 135–145)

## 2013-04-25 LAB — HEMOGLOBIN A1C: Hgb A1c MFr Bld: 6.7 % — ABNORMAL HIGH (ref 4.6–6.5)

## 2013-04-25 NOTE — Telephone Encounter (Signed)
Relevant patient education mailed to patient.  

## 2013-10-22 ENCOUNTER — Ambulatory Visit: Payer: Self-pay | Admitting: Family Medicine

## 2013-11-07 ENCOUNTER — Telehealth: Payer: Self-pay

## 2013-11-07 NOTE — Telephone Encounter (Signed)
LVM for pt to call back to schedule CPE with PCP

## 2013-12-10 ENCOUNTER — Telehealth: Payer: Self-pay

## 2013-12-10 NOTE — Telephone Encounter (Signed)
Pt is ready to schedule 6 month follow with Dr. Etter Sjogren.  Was not able to schedule due to an error in the scheduling matrix.   DM bundle pt.

## 2014-06-17 ENCOUNTER — Encounter: Payer: Self-pay | Admitting: Gastroenterology

## 2014-12-02 ENCOUNTER — Encounter: Payer: Self-pay | Admitting: Family Medicine

## 2014-12-02 ENCOUNTER — Ambulatory Visit (INDEPENDENT_AMBULATORY_CARE_PROVIDER_SITE_OTHER): Payer: BC Managed Care – PPO | Admitting: Family Medicine

## 2014-12-02 VITALS — BP 120/76 | HR 97 | Temp 99.5°F | Wt 263.2 lb

## 2014-12-02 DIAGNOSIS — J208 Acute bronchitis due to other specified organisms: Secondary | ICD-10-CM | POA: Diagnosis not present

## 2014-12-02 MED ORDER — GUAIFENESIN-CODEINE 100-10 MG/5ML PO SYRP: 5.0000 mL | ORAL_SOLUTION | Freq: Three times a day (TID) | ORAL | Status: DC | PRN

## 2014-12-02 MED ORDER — AZITHROMYCIN 250 MG PO TABS: ORAL_TABLET | ORAL | Status: DC

## 2014-12-02 NOTE — Patient Instructions (Signed)

## 2014-12-02 NOTE — Progress Notes (Deleted)
Patient ID: Calvin Chandler, male   DOB: [birth year], 69 y.o.   MRN: [id number]   Subjective:    Patient ID: Calvin Chandler, male    DOB: 1945/06/27, 69 y.o.   MRN: [id number]  Chief Complaint  Patient presents with  . Cough    with SOB and wheezing x's 2 weeks    HPI Patient is in today for ***  Past Medical History  Diagnosis Date  . Anxiety   . Depression   . Diabetes mellitus without complication   . Hyperlipidemia     Past Surgical History  Procedure Laterality Date  . No past surgeries      Family History  Problem Relation Age of Onset  . Prostate cancer Father   . Pulmonary embolism Father   . Hypertension    . Stroke    . Pulmonary embolism Sister   . Clotting disorder      Thromboembolism clotting disorder    Social History   Social History  . Marital Status: Married    Spouse Name: N/A  . Number of Children: N/A  . Years of Education: N/A   Occupational History  . Not on file.   Social History Main Topics  . Smoking status: Former Smoker -- 0.50 packs/day for 13 years    Quit date: 03/03/1981  . Smokeless tobacco: Never Used  . Alcohol Use: No  . Drug Use: No  . Sexual Activity: Not on file   Other Topics Concern  . Not on file   Social History Narrative    Outpatient Prescriptions Prior to Visit  Medication Sig Dispense Refill  . ibuprofen (ADVIL,MOTRIN) 600 MG tablet Take 600 mg by mouth every 6 (six) hours as needed.     No facility-administered medications prior to visit.    No Known Allergies  Review of Systems  Constitutional: Positive for chills and malaise/fatigue. Negative for fever.  HENT: Negative for congestion.   Eyes: Negative for discharge.  Respiratory: Positive for cough and wheezing. Negative for shortness of breath.   Cardiovascular: Negative for chest pain, palpitations and leg swelling.  Gastrointestinal: Negative for nausea and abdominal pain.  Genitourinary: Negative for dysuria.  Musculoskeletal: Negative  for falls.  Skin: Negative for rash.  Neurological: Negative for loss of consciousness and headaches.  Endo/Heme/Allergies: Negative for environmental allergies.  Psychiatric/Behavioral: Negative for depression. The patient is not nervous/anxious.        Objective:    Physical Exam  BP 120/76 mmHg  Pulse 97  Temp(Src) 99.5 F (37.5 C) (Oral)  Wt 263 lb 3.2 oz (119.387 kg)  SpO2 96% Wt Readings from Last 3 Encounters:  12/02/14 263 lb 3.2 oz (119.387 kg)  04/24/13 262 lb (118.842 kg)  02/01/13 260 lb 3.2 oz (118.026 kg)     Lab Results  Component Value Date   WBC 4.0* 04/24/2013   HGB 13.9 04/24/2013   HCT 41.6 04/24/2013   PLT 232.0 04/24/2013   GLUCOSE 111* 04/24/2013   CHOL 191 04/24/2013   TRIG 91.0 04/24/2013   HDL 58.40 04/24/2013   LDLDIRECT 114.4 08/24/2006   LDLCALC 114* 04/24/2013   ALT 19 04/24/2013   AST 20 04/24/2013   NA 138 04/24/2013   K 3.9 04/24/2013   CL 106 04/24/2013   CREATININE 1.1 04/24/2013   BUN 13 04/24/2013   CO2 26 04/24/2013   TSH 0.78 05/05/2009   PSA 1.38 04/24/2013   HGBA1C 6.7* 04/24/2013   MICROALBUR 0.6 04/24/2013    Lab  Results  Component Value Date   TSH 0.78 05/05/2009   Lab Results  Component Value Date   WBC 4.0* 04/24/2013   HGB 13.9 04/24/2013   HCT 41.6 04/24/2013   MCV 85.7 04/24/2013   PLT 232.0 04/24/2013   Lab Results  Component Value Date   NA 138 04/24/2013   K 3.9 04/24/2013   CO2 26 04/24/2013   GLUCOSE 111* 04/24/2013   BUN 13 04/24/2013   CREATININE 1.1 04/24/2013   BILITOT 1.2 04/24/2013   ALKPHOS 75 04/24/2013   AST 20 04/24/2013   ALT 19 04/24/2013   PROT 6.8 04/24/2013   ALBUMIN 3.7 04/24/2013   CALCIUM 9.0 04/24/2013   GFR 86.24 04/24/2013   Lab Results  Component Value Date   CHOL 191 04/24/2013   Lab Results  Component Value Date   HDL 58.40 04/24/2013   Lab Results  Component Value Date   LDLCALC 114* 04/24/2013   Lab Results  Component Value Date   TRIG 91.0  04/24/2013   Lab Results  Component Value Date   CHOLHDL 3 04/24/2013   Lab Results  Component Value Date   HGBA1C 6.7* 04/24/2013       Assessment & Plan:   Problem List Items Addressed This Visit    None      I am having [name] maintain his ibuprofen.  No orders of the defined types were placed in this encounter.     Elizabeth Sauer, LPN

## 2014-12-02 NOTE — Progress Notes (Signed)
Calvin Chandler  male [id number] [birth year] 69 y.o. 12/03/2014      Progress Note-Follow Up  Subjective   HPI  Patient is in today for cough, sore throat,  No fever, + chills.  Cough is productive-- green mucus , body aches.   Using chloroseptic spray and halls with no relief.  Symptoms x 2 weeks Chief Complaint  Patient presents with  . Cough    with SOB and wheezing x's 2 weeks    Past Medical History  Diagnosis Date  . Anxiety   . Depression   . Diabetes mellitus without complication   . Hyperlipidemia     Past Surgical History  Procedure Laterality Date  . No past surgeries      Family History  Problem Relation Age of Onset  . Prostate cancer Father   . Pulmonary embolism Father   . Hypertension    . Stroke    . Pulmonary embolism Sister   . Clotting disorder      Thromboembolism clotting disorder    Social History   Social History  . Marital Status: Married    Spouse Name: N/A  . Number of Children: N/A  . Years of Education: N/A   Occupational History  . Not on file.   Social History Main Topics  . Smoking status: Former Smoker -- 0.50 packs/day for 13 years    Quit date: 03/03/1981  . Smokeless tobacco: Never Used  . Alcohol Use: No  . Drug Use: No  . Sexual Activity: Not on file   Other Topics Concern  . Not on file   Social History Narrative    Current Outpatient Prescriptions on File Prior to Visit  Medication Sig Dispense Refill  . ibuprofen (ADVIL,MOTRIN) 600 MG tablet Take 600 mg by mouth every 6 (six) hours as needed.     No current facility-administered medications on file prior to visit.    No Known Allergies  Review of Systems  Review of Systems  Constitutional: Negative for fever and malaise/fatigue.  HENT: Negative for congestion.   Eyes: Negative for discharge.  Respiratory: Positive for cough. Negative for sputum production and shortness of breath.   Cardiovascular: Negative for chest pain, palpitations and leg  swelling.  Gastrointestinal: Negative for nausea and abdominal pain.  Genitourinary: Negative for dysuria.  Musculoskeletal: Negative for falls.  Skin: Negative for rash.  Neurological: Negative for loss of consciousness and headaches.  Endo/Heme/Allergies: Negative for environmental allergies.  Psychiatric/Behavioral: Negative for depression. The patient is not nervous/anxious.     Objective  Filed Vitals:   12/02/14 1315  BP: 120/76  Pulse: 97  Temp: 99.5 F (37.5 C)  TempSrc: Oral  Weight: 263 lb 3.2 oz (119.387 kg)  SpO2: 96%   Body mass index is 38.85 kg/(m^2).  Physical Exam  Physical Exam  Lab Results  Component Value Date   TSH 0.78 05/05/2009   Lab Results  Component Value Date   WBC 4.0* 04/24/2013   HGB 13.9 04/24/2013   HCT 41.6 04/24/2013   MCV 85.7 04/24/2013   PLT 232.0 04/24/2013   Lab Results  Component Value Date   GFR 86.24 04/24/2013   Lab Results  Component Value Date   CHOL 191 04/24/2013   Lab Results  Component Value Date   HDL 58.40 04/24/2013   Lab Results  Component Value Date   LDLCALC 114* 04/24/2013   Lab Results  Component Value Date   TRIG 91.0 04/24/2013   Lab Results  Component  Value Date   CHOLHDL 3 04/24/2013   Lab Results  Component Value Date   HGBA1C 6.7* 04/24/2013      Assessment & Plan

## 2014-12-02 NOTE — Progress Notes (Signed)
Pre visit review using our clinic review tool, if applicable. No additional management support is needed unless otherwise documented below in the visit note. 

## 2014-12-03 NOTE — Assessment & Plan Note (Signed)
z pack Cough meds per orders Antihistamine

## 2015-01-02 ENCOUNTER — Encounter: Payer: Self-pay | Admitting: Gastroenterology

## 2015-01-27 ENCOUNTER — Encounter: Payer: Self-pay | Admitting: Family Medicine

## 2015-01-27 ENCOUNTER — Ambulatory Visit (INDEPENDENT_AMBULATORY_CARE_PROVIDER_SITE_OTHER): Payer: BC Managed Care – PPO | Admitting: Family Medicine

## 2015-01-27 VITALS — BP 136/86 | HR 82 | Temp 98.2°F | Ht 69.0 in | Wt 267.0 lb

## 2015-01-27 DIAGNOSIS — Z1159 Encounter for screening for other viral diseases: Secondary | ICD-10-CM | POA: Diagnosis not present

## 2015-01-27 DIAGNOSIS — E119 Type 2 diabetes mellitus without complications: Secondary | ICD-10-CM

## 2015-01-27 DIAGNOSIS — Z0000 Encounter for general adult medical examination without abnormal findings: Secondary | ICD-10-CM

## 2015-01-27 DIAGNOSIS — Z23 Encounter for immunization: Secondary | ICD-10-CM | POA: Diagnosis not present

## 2015-01-27 LAB — COMPREHENSIVE METABOLIC PANEL
ALT: 16 U/L (ref 0–53)
AST: 19 U/L (ref 0–37)
Albumin: 3.9 g/dL (ref 3.5–5.2)
Alkaline Phosphatase: 72 U/L (ref 39–117)
BILIRUBIN TOTAL: 0.9 mg/dL (ref 0.2–1.2)
BUN: 14 mg/dL (ref 6–23)
CALCIUM: 9.3 mg/dL (ref 8.4–10.5)
CO2: 31 mEq/L (ref 19–32)
Chloride: 104 mEq/L (ref 96–112)
Creatinine, Ser: 1.06 mg/dL (ref 0.40–1.50)
GFR: 89.53 mL/min (ref >60.00)
GLUCOSE: 120 mg/dL — AB (ref 70–99)
POTASSIUM: 4.3 meq/L (ref 3.5–5.1)
Sodium: 140 mEq/L (ref 135–145)
TOTAL PROTEIN: 6.9 g/dL (ref 6.0–8.3)

## 2015-01-27 LAB — HEMOGLOBIN A1C: HEMOGLOBIN A1C: 7 % — AB (ref 4.6–6.5)

## 2015-01-27 LAB — LIPID PANEL
Cholesterol: 203 mg/dL — ABNORMAL HIGH (ref 0–200)
HDL: 57.4 mg/dL (ref >39.00)
LDL Cholesterol: 118 mg/dL — ABNORMAL HIGH (ref 0–99)
NONHDL: 145.67
Total CHOL/HDL Ratio: 4
Triglycerides: 136 mg/dL (ref 0.0–149.0)
VLDL: 27.2 mg/dL (ref 0.0–40.0)

## 2015-01-27 LAB — CBC WITH DIFFERENTIAL/PLATELET
Basophils Absolute: 0 10*3/uL (ref 0.0–0.1)
Basophils Relative: 0.7 % (ref 0.0–3.0)
EOS PCT: 2.7 % (ref 0.0–5.0)
Eosinophils Absolute: 0.1 10*3/uL (ref 0.0–0.7)
HEMATOCRIT: 43.7 % (ref 39.0–52.0)
Hemoglobin: 14.4 g/dL (ref 13.0–17.0)
LYMPHS ABS: 1.3 10*3/uL (ref 0.7–4.0)
Lymphocytes Relative: 36.3 % (ref 12.0–46.0)
MCHC: 33 g/dL (ref 30.0–36.0)
MCV: 85.9 fl (ref 78.0–100.0)
Monocytes Absolute: 0.3 10*3/uL (ref 0.1–1.0)
Monocytes Relative: 8.6 % (ref 3.0–12.0)
NEUTROS PCT: 51.7 % (ref 43.0–77.0)
Neutro Abs: 1.8 10*3/uL (ref 1.4–7.7)
Platelets: 234 10*3/uL (ref 150.0–400.0)
RBC: 5.09 Mil/uL (ref 4.22–5.81)
RDW: 14.5 % (ref 11.5–15.5)
WBC: 3.5 10*3/uL — ABNORMAL LOW (ref 4.0–10.5)

## 2015-01-27 LAB — POCT URINALYSIS DIPSTICK
BILIRUBIN UA: NEGATIVE
Blood, UA: NEGATIVE
Glucose, UA: NEGATIVE
KETONES UA: NEGATIVE
Leukocytes, UA: NEGATIVE
Nitrite, UA: NEGATIVE
PH UA: 6
PROTEIN UA: NEGATIVE
SPEC GRAV UA: 1.025
Urobilinogen, UA: 0.2

## 2015-01-27 LAB — PSA: PSA: 1.72 ng/mL (ref 0.10–4.00)

## 2015-01-27 NOTE — Patient Instructions (Signed)

## 2015-01-27 NOTE — Progress Notes (Signed)
Subjective:   Calvin Chandler is a 69 y.o. male who presents for an Initial Medicare Annual Wellness Visit.  And f/u dm, htn and cholesterol No complaints.  Review of Systems  .  Review of Systems  Constitutional: Negative for activity change, appetite change and fatigue.  HENT: Negative for hearing loss, congestion, tinnitus and ear discharge.   Eyes: Negative for visual disturbance (see optho q1y -- vision corrected to 20/20 with glasses).  Respiratory: Negative for cough, chest tightness and shortness of breath.   Cardiovascular: Negative for chest pain, palpitations and leg swelling.  Gastrointestinal: Negative for abdominal pain, diarrhea, constipation and abdominal distention.  Genitourinary: Negative for urgency, frequency, decreased urine volume and difficulty urinating.  Musculoskeletal: Negative for back pain, arthralgias and gait problem.  Skin: Negative for color change, pallor and rash.  Neurological: Negative for dizziness, light-headedness, numbness and headaches.  Hematological: Negative for adenopathy. Does not bruise/bleed easily.  Psychiatric/Behavioral: Negative for suicidal ideas, confusion, sleep disturbance, self-injury, dysphoric mood, decreased concentration and agitation.  Pt is able to read and write and can do all ADLs No risk for falling No abuse/ violence in home         Objective:    Today's Vitals   01/27/15 1052 01/27/15 1214  BP: 144/87 136/86  Pulse: 82   Temp: 98.2 F (36.8 C)   TempSrc: Oral   Height: 5' 9"  (1.753 m)   Weight: 267 lb (121.11 kg)   SpO2: 97%   BP 136/86 mmHg  Pulse 82  Temp(Src) 98.2 F (36.8 C) (Oral)  Ht 5' 9"  (1.753 m)  Wt 267 lb (121.11 kg)  BMI 39.41 kg/m2  SpO2 97%  General Appearance:    Alert, cooperative, no distress, appears stated age  Head:    Normocephalic, without obvious abnormality, atraumatic  Eyes:    PERRL, conjunctiva/corneas clear, EOM's intact, fundi    benign, both eyes       Ears:     Normal TM's and external ear canals, both ears  Nose:   Nares normal, septum midline, mucosa normal, no drainage   or sinus tenderness  Throat:   Lips, mucosa, and tongue normal; teeth and gums normal  Neck:   Supple, symmetrical, trachea midline, no adenopathy;       thyroid:  No enlargement/tenderness/nodules; no carotid   bruit or JVD  Back:     Symmetric, no curvature, ROM normal, no CVA tenderness  Lungs:     Clear to auscultation bilaterally, respirations unlabored  Chest wall:    No tenderness or deformity  Heart:    Regular rate and rhythm, S1 and S2 normal, no murmur, rub   or gallop  Abdomen:     Soft, non-tender, bowel sounds active all four quadrants,    no masses, no organomegaly  Genitalia:    Normal male without lesion, discharge or tenderness  Rectal:    Normal tone, normal prostate, no masses or tenderness;   guaiac negative stool  Extremities:   Extremities normal, atraumatic, no cyanosis or edema  Pulses:   2+ and symmetric all extremities  Skin:   Skin color, texture, turgor normal, no rashes or lesions  Lymph nodes:   Cervical, supraclavicular, and axillary nodes normal  Neurologic:   CNII-XII intact. Normal strength, sensation and reflexes      throughout    Current Medications (verified) Outpatient Encounter Prescriptions as of 01/27/2015  Medication Sig  . [DISCONTINUED] azithromycin (ZITHROMAX Z-PAK) 250 MG tablet As directed  . [  DISCONTINUED] guaiFENesin-codeine (CHERATUSSIN AC) 100-10 MG/5ML syrup Take 5 mLs by mouth 3 (three) times daily as needed for cough.  . [DISCONTINUED] ibuprofen (ADVIL,MOTRIN) 600 MG tablet Take 600 mg by mouth every 6 (six) hours as needed.   No facility-administered encounter medications on file as of 01/27/2015.    Allergies (verified) Review of patient's allergies indicates no known allergies.   History: Past Medical History  Diagnosis Date  . Anxiety   . Depression   . Diabetes mellitus without complication ([place])   .  Hyperlipidemia    Past Surgical History  Procedure Laterality Date  . No past surgeries     Family History  Problem Relation Age of Onset  . Prostate cancer Father   . Pulmonary embolism Father   . Hypertension    . Stroke    . Transient ischemic attack Sister   . Clotting disorder      Thromboembolism clotting disorder  . Heart disease Mother    Social History   Occupational History  . Not on file.   Social History Main Topics  . Smoking status: Former Smoker -- 0.50 packs/day for 13 years    Quit date: 03/03/1981  . Smokeless tobacco: Never Used  . Alcohol Use: No  . Drug Use: No  . Sexual Activity: Not on file   Tobacco Counseling Counseling given: Not Answered   Activities of Daily Living In your present state of health, do you have any difficulty performing the following activities: 01/27/2015 12/02/2014  Hearing? N N  Vision? N N  Difficulty concentrating or making decisions? N Y  Walking or climbing stairs? N N  Dressing or bathing? N N  Doing errands, shopping? N N    Immunizations and Health Maintenance Immunization History  Administered Date(s) Administered  . Pneumococcal Conjugate-13 04/24/2013  . Pneumococcal Polysaccharide-23 01/27/2015  . Td 05/05/2009  . Tdap 06/14/2006, 05/05/2009  . Zoster 05/05/2009   Health Maintenance Due  Topic Date Due  . Hepatitis C Screening  November 10, 1945  . OPHTHALMOLOGY EXAM  09/22/1957  . HEMOGLOBIN A1C  10/22/2013  . COLONOSCOPY  04/12/2014  . FOOT EXAM  04/24/2014  . URINE MICROALBUMIN  04/24/2014    Patient Care Team: Rosalita Chessman, DO as PCP - General  Indicate any recent Medical Services you may have received from other than [facility] providers in the past year (date may be approximate).    Assessment:   This is a routine wellness examination for Calvin Chandler.   Hearing/Vision screen Hearing Screening Comments: Whisper test-- normal Vision Screening Comments: See opth annually  Dietary issues and exercise  activities discussed:  Encouraged healthy eating and exercise    Goals    None     Depression Screen PHQ 2/9 Scores 01/27/2015 12/02/2014 04/24/2013 04/24/2013  PHQ - 2 Score 0 1 0 0    Fall Risk Fall Risk  01/27/2015 12/02/2014 04/24/2013  Falls in the past year? No No No    Cognitive Function: mmsee 30/30  Screening Tests Health Maintenance  Topic Date Due  . Hepatitis C Screening  1945/09/10  . OPHTHALMOLOGY EXAM  09/22/1957  . HEMOGLOBIN A1C  10/22/2013  . COLONOSCOPY  04/12/2014  . FOOT EXAM  04/24/2014  . URINE MICROALBUMIN  04/24/2014  . INFLUENZA VACCINE  01/27/2016 (Originally 11/11/2014)  . TETANUS/TDAP  05/06/2019  . ZOSTAVAX  Completed  . PNA vac Low Risk Adult  Completed        Plan:   see AVS ghm utd Check labs  During the course of the visit Muad was educated and counseled about the following appropriate screening and preventive services:   Vaccines to include Pneumoccal, Influenza, Hepatitis B, Td, Zostavax, HCV  Electrocardiogram  Colorectal cancer screening  Cardiovascular disease screening  Diabetes screening  Glaucoma screening  Nutrition counseling  Prostate cancer screening  Smoking cessation counseling  Patient Instructions (the written plan) were given to the patient.  1. Preventative health care See above - Comp Met (CMET) - CBC with Differential/Platelet - Lipid panel - POCT urinalysis dipstick - PSA - Ambulatory referral to Gastroenterology  2. Need for hepatitis C screening test   - Hepatitis C antibody  3. Controlled type 2 diabetes mellitus without complication, without long-term current use of insulin (HCC) Check labs - Hemoglobin A1c  4. Routine history and physical examination of adult    5. Need for 23-polyvalent pneumococcal polysaccharide vaccine   - Pneumococcal polysaccharide vaccine 23-valent greater than or equal to 67yo subcutaneous/IM  Garnet Koyanagi, DO   01/27/2015

## 2015-01-27 NOTE — Progress Notes (Signed)
Pre visit review using our clinic review tool, if applicable. No additional management support is needed unless otherwise documented below in the visit note. 

## 2015-01-28 LAB — HEPATITIS C ANTIBODY: HCV AB: NEGATIVE

## 2015-07-28 ENCOUNTER — Ambulatory Visit: Payer: BC Managed Care – PPO | Admitting: Family Medicine

## 2015-08-25 ENCOUNTER — Encounter: Payer: Self-pay | Admitting: Family Medicine

## 2015-08-25 ENCOUNTER — Ambulatory Visit (INDEPENDENT_AMBULATORY_CARE_PROVIDER_SITE_OTHER): Payer: BC Managed Care – PPO | Admitting: Family Medicine

## 2015-08-25 VITALS — BP 124/72 | HR 95 | Temp 98.2°F | Ht 69.0 in | Wt 265.8 lb

## 2015-08-25 DIAGNOSIS — E785 Hyperlipidemia, unspecified: Secondary | ICD-10-CM | POA: Diagnosis not present

## 2015-08-25 DIAGNOSIS — E1151 Type 2 diabetes mellitus with diabetic peripheral angiopathy without gangrene: Secondary | ICD-10-CM

## 2015-08-25 DIAGNOSIS — IMO0002 Reserved for concepts with insufficient information to code with codable children: Secondary | ICD-10-CM

## 2015-08-25 DIAGNOSIS — E1165 Type 2 diabetes mellitus with hyperglycemia: Secondary | ICD-10-CM | POA: Diagnosis not present

## 2015-08-25 LAB — COMPREHENSIVE METABOLIC PANEL
ALT: 17 U/L (ref 0–53)
AST: 19 U/L (ref 0–37)
Albumin: 3.9 g/dL (ref 3.5–5.2)
Alkaline Phosphatase: 65 U/L (ref 39–117)
BILIRUBIN TOTAL: 1.1 mg/dL (ref 0.2–1.2)
BUN: 14 mg/dL (ref 6–23)
CO2: 28 meq/L (ref 19–32)
Calcium: 9.1 mg/dL (ref 8.4–10.5)
Chloride: 106 mEq/L (ref 96–112)
Creatinine, Ser: 1.05 mg/dL (ref 0.40–1.50)
GFR: 90.35 mL/min (ref >60.00)
GLUCOSE: 137 mg/dL — AB (ref 70–99)
POTASSIUM: 3.5 meq/L (ref 3.5–5.1)
SODIUM: 139 meq/L (ref 135–145)
TOTAL PROTEIN: 7 g/dL (ref 6.0–8.3)

## 2015-08-25 LAB — POCT URINALYSIS DIPSTICK
Bilirubin, UA: NEGATIVE
Blood, UA: NEGATIVE
Glucose, UA: NEGATIVE
Ketones, UA: NEGATIVE
LEUKOCYTES UA: NEGATIVE
NITRITE UA: NEGATIVE
PH UA: 6
PROTEIN UA: NEGATIVE
Spec Grav, UA: 1.025
Urobilinogen, UA: NEGATIVE

## 2015-08-25 LAB — MICROALBUMIN / CREATININE URINE RATIO
Creatinine,U: 255.3 mg/dL
MICROALB/CREAT RATIO: 0.4 mg/g (ref 0.0–30.0)
Microalb, Ur: 0.9 mg/dL (ref 0.0–1.9)

## 2015-08-25 LAB — LIPID PANEL
CHOLESTEROL: 177 mg/dL (ref 0–200)
HDL: 46.9 mg/dL (ref >39.00)
LDL Cholesterol: 113 mg/dL — ABNORMAL HIGH (ref 0–99)
NONHDL: 129.89
Total CHOL/HDL Ratio: 4
Triglycerides: 83 mg/dL (ref 0.0–149.0)
VLDL: 16.6 mg/dL (ref 0.0–40.0)

## 2015-08-25 LAB — HEMOGLOBIN A1C: HEMOGLOBIN A1C: 7.4 % — AB (ref 4.6–6.5)

## 2015-08-25 NOTE — Progress Notes (Signed)
Pre visit review using our clinic review tool, if applicable. No additional management support is needed unless otherwise documented below in the visit note. 

## 2015-08-25 NOTE — Patient Instructions (Signed)

## 2015-08-25 NOTE — Progress Notes (Signed)
Patient ID: Calvin Chandler, male    DOB: Jun 25, 1945  Age: 70 y.o. MRN: [id number]    Subjective:  Subjective HPI Calvin Chandler presents for f/u cholesterol and dm.  No complaints.    Review of Systems  Constitutional: Negative for diaphoresis, appetite change, fatigue and unexpected weight change.  Eyes: Negative for pain, redness and visual disturbance.  Respiratory: Negative for cough, chest tightness, shortness of breath and wheezing.   Cardiovascular: Negative for chest pain, palpitations and leg swelling.  Endocrine: Negative for cold intolerance, heat intolerance, polydipsia, polyphagia and polyuria.  Genitourinary: Negative for dysuria, frequency and difficulty urinating.  Neurological: Negative for dizziness, light-headedness, numbness and headaches.    History Past Medical History  Diagnosis Date  . Anxiety   . Depression   . Diabetes mellitus without complication ([place])   . Hyperlipidemia     He has past surgical history that includes No past surgeries.   His family history includes Heart disease in his mother; Prostate cancer in his father; Pulmonary embolism in his father; Transient ischemic attack in his sister.He reports that he quit smoking about 34 years ago. He has never used smokeless tobacco. He reports that he does not drink alcohol or use illicit drugs.  No current outpatient prescriptions on file prior to visit.   No current facility-administered medications on file prior to visit.     Objective:  Objective Physical Exam  Constitutional: He is oriented to person, place, and time. Vital signs are normal. He appears well-developed and well-nourished. He is sleeping.  HENT:  Head: Normocephalic and atraumatic.  Mouth/Throat: Oropharynx is clear and moist.  Eyes: EOM are normal. Pupils are equal, round, and reactive to light.  Neck: Normal range of motion. Neck supple. No thyromegaly present.  Cardiovascular: Normal rate and regular rhythm.   No murmur  heard. Pulmonary/Chest: Effort normal and breath sounds normal. No respiratory distress. He has no wheezes. He has no rales. He exhibits no tenderness.  Musculoskeletal: He exhibits no edema or tenderness.  Neurological: He is alert and oriented to person, place, and time.  Skin: Skin is warm and dry.  Psychiatric: He has a normal mood and affect. His behavior is normal. Judgment and thought content normal.  Nursing note and vitals reviewed. Sensory exam of the foot is normal, tested with the monofilament. Good pulses, no lesions or ulcers, good peripheral pulses.   BP 124/72 mmHg  Pulse 95  Temp(Src) 98.2 F (36.8 C) (Oral)  Ht 5\' 9"  (1.753 m)  Wt 265 lb 12.8 oz (120.566 kg)  BMI 39.23 kg/m2  SpO2 98% Wt Readings from Last 3 Encounters:  08/25/15 265 lb 12.8 oz (120.566 kg)  01/27/15 267 lb (121.11 kg)  12/02/14 263 lb 3.2 oz (119.387 kg)     Lab Results  Component Value Date   WBC 3.5* 01/27/2015   HGB 14.4 01/27/2015   HCT 43.7 01/27/2015   PLT 234.0 01/27/2015   GLUCOSE 137* 08/25/2015   CHOL 177 08/25/2015   TRIG 83.0 08/25/2015   HDL 46.90 08/25/2015   LDLDIRECT 114.4 08/24/2006   LDLCALC 113* 08/25/2015   ALT 17 08/25/2015   AST 19 08/25/2015   NA 139 08/25/2015   K 3.5 08/25/2015   CL 106 08/25/2015   CREATININE 1.05 08/25/2015   BUN 14 08/25/2015   CO2 28 08/25/2015   TSH 0.78 05/05/2009   PSA 1.72 01/27/2015   HGBA1C 7.4* 08/25/2015   MICROALBUR 0.9 08/25/2015    No results found.  Assessment & Plan:  Plan Mr. Nordine does not currently have medications on file.  No orders of the defined types were placed in this encounter.    Problem List Items Addressed This Visit    None    Visit Diagnoses    Hyperlipidemia LDL goal <100    -  Primary    Relevant Orders    Lipid panel (Completed)    Hemoglobin A1c (Completed)    Comprehensive metabolic panel (Completed)    DM (diabetes mellitus) type II uncontrolled, periph vascular disorder ([facility])          Relevant Orders    Lipid panel (Completed)    Hemoglobin A1c (Completed)    Comprehensive metabolic panel (Completed)    Microalbumin / creatinine urine ratio (Completed)    POCT urinalysis dipstick (Completed)       Follow-up: Return in about 3 months (around 11/25/2015), or if symptoms worsen or fail to improve, for hyperlipidemia, diabetes II.  Ann Held, DO

## 2015-08-25 NOTE — Assessment & Plan Note (Signed)
Check labs 

## 2015-08-25 NOTE — Assessment & Plan Note (Signed)
Pt not checking BS because he lost his glucometer in the move Pt given new glucometer Check labs

## 2015-09-03 ENCOUNTER — Other Ambulatory Visit: Payer: Self-pay | Admitting: *Deleted

## 2015-09-03 MED ORDER — METFORMIN HCL ER 500 MG PO TB24: 500.0000 mg | ORAL_TABLET | Freq: Every evening | ORAL | Status: DC

## 2015-09-03 MED ORDER — GLUCOSE BLOOD VI STRP: ORAL_STRIP | Status: DC

## 2015-09-03 NOTE — Telephone Encounter (Signed)
Rx sent to the pharmacy by e-script.//AB/CMA 

## 2015-09-03 NOTE — Addendum Note (Signed)
Addended by: Dorrene German on: 09/03/2015 04:49 PM   Modules accepted: Orders

## 2015-11-25 ENCOUNTER — Ambulatory Visit: Payer: BC Managed Care – PPO | Admitting: Family Medicine

## 2016-01-12 ENCOUNTER — Ambulatory Visit: Payer: BC Managed Care – PPO | Admitting: Family Medicine

## 2016-04-26 LAB — HM DIABETES EYE EXAM

## 2016-05-26 ENCOUNTER — Encounter: Payer: Self-pay | Admitting: *Deleted

## 2017-03-07 ENCOUNTER — Encounter: Payer: Self-pay | Admitting: Family Medicine

## 2017-03-07 ENCOUNTER — Ambulatory Visit (HOSPITAL_BASED_OUTPATIENT_CLINIC_OR_DEPARTMENT_OTHER): Payer: BC Managed Care – PPO

## 2017-03-07 ENCOUNTER — Ambulatory Visit (INDEPENDENT_AMBULATORY_CARE_PROVIDER_SITE_OTHER): Payer: BC Managed Care – PPO | Admitting: Family Medicine

## 2017-03-07 ENCOUNTER — Ambulatory Visit: Payer: BC Managed Care – PPO | Admitting: Family Medicine

## 2017-03-07 VITALS — BP 138/82 | HR 86 | Temp 97.8°F | Resp 16 | Ht 69.0 in | Wt 271.6 lb

## 2017-03-07 DIAGNOSIS — E1151 Type 2 diabetes mellitus with diabetic peripheral angiopathy without gangrene: Secondary | ICD-10-CM

## 2017-03-07 DIAGNOSIS — M25561 Pain in right knee: Secondary | ICD-10-CM | POA: Diagnosis not present

## 2017-03-07 DIAGNOSIS — M79661 Pain in right lower leg: Secondary | ICD-10-CM | POA: Diagnosis not present

## 2017-03-07 DIAGNOSIS — IMO0002 Reserved for concepts with insufficient information to code with codable children: Secondary | ICD-10-CM

## 2017-03-07 DIAGNOSIS — E1165 Type 2 diabetes mellitus with hyperglycemia: Secondary | ICD-10-CM

## 2017-03-07 HISTORY — DX: Pain in right lower leg: M79.661

## 2017-03-07 HISTORY — DX: Pain in right knee: M25.561

## 2017-03-07 LAB — POC URINALSYSI DIPSTICK (AUTOMATED)
BILIRUBIN UA: NEGATIVE
GLUCOSE UA: NEGATIVE
Ketones, UA: NEGATIVE
Leukocytes, UA: NEGATIVE
NITRITE UA: NEGATIVE
Protein, UA: NEGATIVE
RBC UA: NEGATIVE
SPEC GRAV UA: 1.025 (ref 1.010–1.025)
UROBILINOGEN UA: 0.2 U/dL
pH, UA: 6 (ref 5.0–8.0)

## 2017-03-07 LAB — COMPREHENSIVE METABOLIC PANEL
ALK PHOS: 65 U/L (ref 39–117)
ALT: 14 U/L (ref 0–53)
AST: 14 U/L (ref 0–37)
Albumin: 3.6 g/dL (ref 3.5–5.2)
BILIRUBIN TOTAL: 0.7 mg/dL (ref 0.2–1.2)
BUN: 13 mg/dL (ref 6–23)
CALCIUM: 9.1 mg/dL (ref 8.4–10.5)
CO2: 26 meq/L (ref 19–32)
CREATININE: 0.93 mg/dL (ref 0.40–1.50)
Chloride: 104 mEq/L (ref 96–112)
GFR: 103.47 mL/min (ref >60.00)
GLUCOSE: 125 mg/dL — AB (ref 70–99)
Potassium: 4.1 mEq/L (ref 3.5–5.1)
Sodium: 137 mEq/L (ref 135–145)
TOTAL PROTEIN: 6.9 g/dL (ref 6.0–8.3)

## 2017-03-07 LAB — LIPID PANEL
Cholesterol: 172 mg/dL (ref 0–200)
HDL: 47.2 mg/dL (ref >39.00)
LDL Cholesterol: 101 mg/dL — ABNORMAL HIGH (ref 0–99)
NonHDL: 125.08
TRIGLYCERIDES: 122 mg/dL (ref 0.0–149.0)
Total CHOL/HDL Ratio: 4
VLDL: 24.4 mg/dL (ref 0.0–40.0)

## 2017-03-07 LAB — HEMOGLOBIN A1C: HEMOGLOBIN A1C: 7.8 % — AB (ref 4.6–6.5)

## 2017-03-07 MED ORDER — METHYLPREDNISOLONE ACETATE 40 MG/ML IJ SUSP
40.0000 mg | Freq: Once | INTRAMUSCULAR | Status: AC
  Administered 2017-03-07: 40 mg via INTRA_ARTICULAR

## 2017-03-07 NOTE — Assessment & Plan Note (Signed)
Pt to see sport med today Pt felt pop behind knee---? Bakers cyst Will hold off on doppler until sees sport med

## 2017-03-07 NOTE — Patient Instructions (Signed)

## 2017-03-07 NOTE — Patient Instructions (Signed)
Your pain is due to arthritis vs a degenerative meniscus tear. These are the different medications you can take for this: Tylenol 500mg  1-2 tabs three times a day for pain. Capsaicin, aspercreme, or biofreeze topically up to four times a day may also help with pain. Some supplements that may help for arthritis: Boswellia extract, curcumin, pycnogenol Aleve 1-2 tabs twice a day with food if you can take anti-inflammatories Cortisone injections are an option - you were given this today. If cortisone injections do not help, there are different types of shots that may help but they take longer to take effect. It's important that you continue to stay active. Straight leg raises, knee extensions 3 sets of 10 once a day (add ankle weight if these become too easy). Consider physical therapy to strengthen muscles around the joint that hurts to take pressure off of the joint itself. Shoe inserts with good arch support may be helpful. Heat or ice 15 minutes at a time 3-4 times a day as needed to help with pain. Water aerobics and cycling with low resistance are the best two types of exercise for arthritis though any exercise is ok as long as it doesn't worsen the pain. Follow up with me in 1 month or as needed if you're doing well.

## 2017-03-07 NOTE — Progress Notes (Signed)
Patient ID: [name], male   DOB: 1945-09-29, 71 y.o.   MRN: [id number]     Subjective:  I acted as a Education administrator for Dr. Carollee Herter.  [name], [place]   Patient ID: Calvin Chandler, male    DOB: 05-01-1945, 71 y.o.   MRN: [id number]  Chief Complaint  Patient presents with  . Knee Pain    right    Knee Pain   Incident onset: over a week ago. There was no injury mechanism. The pain is present in the right knee. The quality of the pain is described as aching. Associated symptoms include an inability to bear weight. Pertinent negatives include no loss of motion, loss of sensation, muscle weakness or tingling. He has tried NSAIDs (blue emu) for the symptoms.   pt states R knee popped while getting out of bed last week and has progressively gotten worse , stairs are very difficult (going up and down)  Pt has pain with walking , standing  Etc.   Patient is in today for right knee pain. Patient Care Team: Carollee Herter, Alferd Apa, DO as PCP - General   Past Medical History:  Diagnosis Date  . Anxiety   . Depression   . Diabetes mellitus without complication ([place])   . Hyperlipidemia     Past Surgical History:  Procedure Laterality Date  . NO PAST SURGERIES      Family History  Problem Relation Age of Onset  . Prostate cancer Father   . Pulmonary embolism Father   . Hypertension Unknown   . Stroke Unknown   . Transient ischemic attack Sister   . Clotting disorder Unknown        Thromboembolism clotting disorder  . Heart disease Mother     Social History   Socioeconomic History  . Marital status: Married    Spouse name: Not on file  . Number of children: Not on file  . Years of education: Not on file  . Highest education level: Not on file  Social Needs  . Financial resource strain: Not on file  . Food insecurity - worry: Not on file  . Food insecurity - inability: Not on file  . Transportation needs - medical: Not on file  . Transportation needs - non-medical: Not on file   Occupational History  . Not on file  Tobacco Use  . Smoking status: Former Smoker    Packs/day: 0.50    Years: 13.00    Pack years: 6.50    Last attempt to quit: 03/03/1981    Years since quitting: 36.0  . Smokeless tobacco: Never Used  Substance and Sexual Activity  . Alcohol use: No  . Drug use: No  . Sexual activity: Not on file  Other Topics Concern  . Not on file  Social History Narrative  . Not on file    Outpatient Medications Prior to Visit  Medication Sig Dispense Refill  . glucose blood (ONETOUCH VERIO) test strip Check blood sugars daily or as needed.  DX:E11.51 (Patient not taking: Reported on 03/07/2017) 100 each 12  . metFORMIN (GLUCOPHAGE XR) 500 MG 24 hr tablet Take 1 tablet (500 mg total) by mouth every evening. (Patient not taking: Reported on 03/07/2017) 30 tablet 2   No facility-administered medications prior to visit.     No Known Allergies  Review of Systems  Constitutional: Negative for fever and malaise/fatigue.  HENT: Negative for congestion.   Eyes: Negative for blurred vision.  Respiratory: Negative for cough and shortness  of breath.   Cardiovascular: Negative for chest pain, palpitations and leg swelling.  Gastrointestinal: Negative for vomiting.  Musculoskeletal: Negative for back pain.  Skin: Negative for rash.  Neurological: Negative for tingling, loss of consciousness and headaches.       Objective:    Physical Exam  Constitutional: He is oriented to person, place, and time. Vital signs are normal. He appears well-developed and well-nourished. He is sleeping.  HENT:  Head: Normocephalic and atraumatic.  Mouth/Throat: Oropharynx is clear and moist.  Eyes: EOM are normal. Pupils are equal, round, and reactive to light.  Neck: Normal range of motion. Neck supple. No thyromegaly present.  Cardiovascular: Normal rate and regular rhythm.  No murmur heard. Pulmonary/Chest: Effort normal and breath sounds normal. No respiratory distress.  He has no wheezes. He has no rales. He exhibits no tenderness.  Musculoskeletal: He exhibits tenderness. He exhibits no edema.       Right knee: Tenderness found. Medial joint line and patellar tendon tenderness noted.       Right lower leg: He exhibits tenderness. He exhibits no swelling and no edema.  Neurological: He is alert and oriented to person, place, and time.  Skin: Skin is warm and dry.  Psychiatric: He has a normal mood and affect. His behavior is normal. Judgment and thought content normal.  Nursing note and vitals reviewed.   BP 138/82 (BP Location: Right Arm, Cuff Size: Large)   Pulse 86   Temp 97.8 F (36.6 C) (Oral)   Resp 16   Ht 5\' 9"  (1.753 m)   Wt 271 lb 9.6 oz (123.2 kg)   SpO2 97%   BMI 40.11 kg/m  Wt Readings from Last 3 Encounters:  03/07/17 271 lb 9.6 oz (123.2 kg)  08/25/15 265 lb 12.8 oz (120.6 kg)  01/27/15 267 lb (121.1 kg)   BP Readings from Last 3 Encounters:  03/07/17 138/82  08/25/15 124/72  01/27/15 136/86     Immunization History  Administered Date(s) Administered  . Pneumococcal Conjugate-13 04/24/2013  . Pneumococcal Polysaccharide-23 01/27/2015  . Td 05/05/2009  . Tdap 06/14/2006, 05/05/2009  . Zoster 05/05/2009    Health Maintenance  Topic Date Due  . COLONOSCOPY  04/12/2014  . HEMOGLOBIN A1C  02/25/2016  . FOOT EXAM  08/24/2016  . URINE MICROALBUMIN  08/24/2016  . INFLUENZA VACCINE  08/01/2027 (Originally 11/10/2016)  . OPHTHALMOLOGY EXAM  04/26/2017  . TETANUS/TDAP  05/06/2019  . Hepatitis C Screening  Completed  . PNA vac Low Risk Adult  Completed    Lab Results  Component Value Date   WBC 3.5 (L) 01/27/2015   HGB 14.4 01/27/2015   HCT 43.7 01/27/2015   PLT 234.0 01/27/2015   GLUCOSE 137 (H) 08/25/2015   CHOL 177 08/25/2015   TRIG 83.0 08/25/2015   HDL 46.90 08/25/2015   LDLDIRECT 114.4 08/24/2006   LDLCALC 113 (H) 08/25/2015   ALT 17 08/25/2015   AST 19 08/25/2015   NA 139 08/25/2015   K 3.5 08/25/2015   CL  106 08/25/2015   CREATININE 1.05 08/25/2015   BUN 14 08/25/2015   CO2 28 08/25/2015   TSH 0.78 05/05/2009   PSA 1.72 01/27/2015   HGBA1C 7.4 (H) 08/25/2015   MICROALBUR 0.9 08/25/2015    Lab Results  Component Value Date   TSH 0.78 05/05/2009   Lab Results  Component Value Date   WBC 3.5 (L) 01/27/2015   HGB 14.4 01/27/2015   HCT 43.7 01/27/2015   MCV 85.9 01/27/2015  PLT 234.0 01/27/2015   Lab Results  Component Value Date   NA 139 08/25/2015   K 3.5 08/25/2015   CO2 28 08/25/2015   GLUCOSE 137 (H) 08/25/2015   BUN 14 08/25/2015   CREATININE 1.05 08/25/2015   BILITOT 1.1 08/25/2015   ALKPHOS 65 08/25/2015   AST 19 08/25/2015   ALT 17 08/25/2015   PROT 7.0 08/25/2015   ALBUMIN 3.9 08/25/2015   CALCIUM 9.1 08/25/2015   GFR 90.35 08/25/2015   Lab Results  Component Value Date   CHOL 177 08/25/2015   Lab Results  Component Value Date   HDL 46.90 08/25/2015   Lab Results  Component Value Date   LDLCALC 113 (H) 08/25/2015   Lab Results  Component Value Date   TRIG 83.0 08/25/2015   Lab Results  Component Value Date   CHOLHDL 4 08/25/2015   Lab Results  Component Value Date   HGBA1C 7.4 (H) 08/25/2015         Assessment & Plan:   Problem List Items Addressed This Visit      Unprioritized   Acute pain of right knee    Pt to see sport med today Pt felt pop behind knee---? Bakers cyst Will hold off on doppler until sees sport med      Right calf pain - Primary   Relevant Orders   VAS [place] LOWER EXTREMITY VENOUS (DVT)   US Venous Img Lower Unilateral Right   Ambulatory referral to Sports Medicine    Other Visit Diagnoses    DM (diabetes mellitus) type II uncontrolled, periph vascular disorder ([place])       Relevant Orders   Hemoglobin A1c   Lipid panel   Comprehensive metabolic panel   Microalbumin / creatinine urine ratio   POCT Urinalysis Dipstick (Automated) (Completed)      I am having Vinnie Level maintain his metFORMIN and  glucose blood.  No orders of the defined types were placed in this encounter.   CMA served as Education administrator during this visit. History, Physical and Plan performed by medical provider. Documentation and orders reviewed and attested to.  Ann Held, DO

## 2017-03-08 ENCOUNTER — Encounter: Payer: Self-pay | Admitting: Family Medicine

## 2017-03-08 LAB — MICROALBUMIN / CREATININE URINE RATIO
CREATININE, U: 135.5 mg/dL
MICROALB/CREAT RATIO: 0.5 mg/g (ref 0.0–30.0)

## 2017-03-08 NOTE — Assessment & Plan Note (Signed)
consistent with arthritis vs degenerative meniscus tear.  Discussed tylenol, topical medications, supplements that may help, aleve.  Injection given today.  Shown home exercises to do daily.  Consider physical therapy, viscosupplementation.  Heat/ice.  F/u in 1 month or prn if doing well.  After informed written consent timeout was performed, patient was seated on exam table. Right knee was prepped with alcohol swab and utilizing anteromedial approach, patient's right knee was injected intraarticularly with 3:1 bupivicaine: depomedrol. Patient tolerated the procedure well without immediate complications.

## 2017-03-08 NOTE — Progress Notes (Signed)
PCP: Ann Held, DO  Subjective:   HPI: Patient is a 71 y.o. male here for right knee/calf pain.  Patient reports about 1 week ago he developed pain in right knee mainly posteriorly. Pain comes and goes, also with some medial pain. Has tried blue emu, motrin, and voltaren gel. Calf feels like a cramp at times. Pain level 7/10. No skin changes, numbness. Drives a school bus.  Past Medical History:  Diagnosis Date  . Anxiety   . Depression   . Diabetes mellitus without complication ([place])   . Hyperlipidemia     Current Outpatient Medications on File Prior to Visit  Medication Sig Dispense Refill  . glucose blood (ONETOUCH VERIO) test strip Check blood sugars daily or as needed.  DX:E11.51 (Patient not taking: Reported on 03/07/2017) 100 each 12  . metFORMIN (GLUCOPHAGE XR) 500 MG 24 hr tablet Take 1 tablet (500 mg total) by mouth every evening. (Patient not taking: Reported on 03/07/2017) 30 tablet 2   No current facility-administered medications on file prior to visit.     Past Surgical History:  Procedure Laterality Date  . NO PAST SURGERIES      No Known Allergies  Social History   Socioeconomic History  . Marital status: Married    Spouse name: Not on file  . Number of children: Not on file  . Years of education: Not on file  . Highest education level: Not on file  Social Needs  . Financial resource strain: Not on file  . Food insecurity - worry: Not on file  . Food insecurity - inability: Not on file  . Transportation needs - medical: Not on file  . Transportation needs - non-medical: Not on file  Occupational History  . Not on file  Tobacco Use  . Smoking status: Former Smoker    Packs/day: 0.50    Years: 13.00    Pack years: 6.50    Last attempt to quit: 03/03/1981    Years since quitting: 36.0  . Smokeless tobacco: Never Used  Substance and Sexual Activity  . Alcohol use: No  . Drug use: No  . Sexual activity: Not on file  Other Topics  Concern  . Not on file  Social History Narrative  . Not on file    Family History  Problem Relation Age of Onset  . Prostate cancer Father   . Pulmonary embolism Father   . Hypertension Unknown   . Stroke Unknown   . Transient ischemic attack Sister   . Clotting disorder Unknown        Thromboembolism clotting disorder  . Heart disease Mother     BP 136/86   Pulse 73   Ht 5\' 9"  (1.753 m)   Wt 265 lb (120.2 kg)   BMI 39.13 kg/m   Review of Systems: See HPI above.     Objective:  Physical Exam:  Gen: NAD, comfortable in exam room  Right knee: No gross deformity, ecchymoses, swelling. TTP medial joint line.  No other tenderness. FROM with 5/5 strength. Negative ant/post drawers. Negative valgus/varus testing. Negative lachmanns. Negative mcmurrays, apleys, patellar apprehension. NV intact distally.  Left knee: No gross deformity, ecchymoses, swelling. No TTP. FROM with full strength. Negative ant/post drawers. Negative valgus/varus testing. Negative mcmurrays, apleys. NV intact distally.   Assessment & Plan:  1. Right knee pain - consistent with arthritis vs degenerative meniscus tear.  Discussed tylenol, topical medications, supplements that may help, aleve.  Injection given today.  Shown home  exercises to do daily.  Consider physical therapy, viscosupplementation.  Heat/ice.  F/u in 1 month or prn if doing well.  After informed written consent timeout was performed, patient was seated on exam table. Right knee was prepped with alcohol swab and utilizing anteromedial approach, patient's right knee was injected intraarticularly with 3:1 bupivicaine: depomedrol. Patient tolerated the procedure well without immediate complications.

## 2017-03-09 ENCOUNTER — Other Ambulatory Visit: Payer: Self-pay | Admitting: *Deleted

## 2017-03-09 MED ORDER — METFORMIN HCL ER 500 MG PO TB24: 500.0000 mg | ORAL_TABLET | Freq: Every evening | ORAL | 2 refills | Status: DC

## 2017-04-29 LAB — HM DIABETES EYE EXAM

## 2017-05-03 ENCOUNTER — Telehealth: Payer: Self-pay | Admitting: Family Medicine

## 2017-05-03 NOTE — Telephone Encounter (Signed)
Called pt left message appt time needs to be changed or rescheduled per pcp.

## 2017-06-02 ENCOUNTER — Ambulatory Visit (INDEPENDENT_AMBULATORY_CARE_PROVIDER_SITE_OTHER): Payer: BC Managed Care – PPO | Admitting: Family Medicine

## 2017-06-02 ENCOUNTER — Encounter: Payer: Self-pay | Admitting: Family Medicine

## 2017-06-02 VITALS — BP 130/78 | HR 108 | Temp 98.0°F | Resp 16 | Ht 69.0 in | Wt 269.0 lb

## 2017-06-02 DIAGNOSIS — M25561 Pain in right knee: Secondary | ICD-10-CM | POA: Diagnosis not present

## 2017-06-02 DIAGNOSIS — J40 Bronchitis, not specified as acute or chronic: Secondary | ICD-10-CM

## 2017-06-02 MED ORDER — TRAMADOL HCL 50 MG PO TABS
50.0000 mg | ORAL_TABLET | Freq: Three times a day (TID) | ORAL | 0 refills | Status: DC | PRN
Start: 2017-06-02 — End: 2017-10-04

## 2017-06-02 MED ORDER — AZITHROMYCIN 250 MG PO TABS: ORAL_TABLET | ORAL | 0 refills | Status: DC

## 2017-06-02 MED ORDER — PROMETHAZINE-DM 6.25-15 MG/5ML PO SYRP: 5.0000 mL | ORAL_SOLUTION | Freq: Four times a day (QID) | ORAL | 0 refills | Status: DC | PRN

## 2017-06-02 MED ORDER — LORATADINE 10 MG PO TABS: 10.0000 mg | ORAL_TABLET | Freq: Every day | ORAL | 11 refills | Status: DC

## 2017-06-02 NOTE — Progress Notes (Signed)
Patient ID: Calvin Chandler, male   DOB: May 24, 1945, 72 y.o.   MRN: [id number]    Subjective:  I acted as a Education administrator for Dr. Carollee Herter.  Calvin Chandler, Calvin Chandler   Patient ID: Calvin Chandler, male    DOB: 1945/06/15, 72 y.o.   MRN: [id number]  Chief Complaint  Patient presents with  . Cough  . Knee Pain    RIGHT    Cough  This is a new problem. Episode onset: 3 weeks. The cough is productive of sputum and productive of brown sputum. Associated symptoms include nasal congestion and postnasal drip. Pertinent negatives include no chest pain, chills, fever, headaches, rash or shortness of breath. Treatments tried: thera-flu, corciden, cough drops. There is no history of environmental allergies.    Patient is in today for cough and right knee pain.   Pt has seen Dr Barbaraann Barthel for knee pain and was given an injection but it did not help.  He is requesting an ortho referral Patient Care Team: Carollee Herter, Alferd Apa, DO as PCP - General   Past Medical History:  Diagnosis Date  . Anxiety   . Depression   . Diabetes mellitus without complication ([place])   . Hyperlipidemia     Past Surgical History:  Procedure Laterality Date  . NO PAST SURGERIES      Family History  Problem Relation Age of Onset  . Prostate cancer Father   . Pulmonary embolism Father   . Hypertension Unknown   . Stroke Unknown   . Transient ischemic attack Sister   . Clotting disorder Unknown        Thromboembolism clotting disorder  . Heart disease Mother     Social History   Socioeconomic History  . Marital status: Married    Spouse name: Not on file  . Number of children: Not on file  . Years of education: Not on file  . Highest education level: Not on file  Social Needs  . Financial resource strain: Not on file  . Food insecurity - worry: Not on file  . Food insecurity - inability: Not on file  . Transportation needs - medical: Not on file  . Transportation needs - non-medical: Not on file  Occupational History    . Not on file  Tobacco Use  . Smoking status: Former Smoker    Packs/day: 0.50    Years: 13.00    Pack years: 6.50    Last attempt to quit: 03/03/1981    Years since quitting: 36.2  . Smokeless tobacco: Never Used  Substance and Sexual Activity  . Alcohol use: No  . Drug use: No  . Sexual activity: Not on file  Other Topics Concern  . Not on file  Social History Narrative  . Not on file    Outpatient Medications Prior to Visit  Medication Sig Dispense Refill  . glucose blood (ONETOUCH VERIO) test strip Check blood sugars daily or as needed.  DX:E11.51 100 each 12  . metFORMIN (GLUCOPHAGE XR) 500 MG 24 hr tablet Take 1 tablet (500 mg total) by mouth every evening. 30 tablet 2   No facility-administered medications prior to visit.     No Known Allergies  Review of Systems  Constitutional: Negative for chills, fever and malaise/fatigue.  HENT: Positive for postnasal drip. Negative for congestion.   Eyes: Negative for blurred vision.  Respiratory: Positive for cough. Negative for shortness of breath.   Cardiovascular: Negative for chest pain, palpitations and leg swelling.  Gastrointestinal: Negative for  abdominal pain, blood in stool and nausea.  Genitourinary: Negative for dysuria and frequency.  Musculoskeletal: Positive for joint pain. Negative for falls.  Skin: Negative for rash.  Neurological: Negative for dizziness, loss of consciousness and headaches.  Endo/Heme/Allergies: Negative for environmental allergies.  Psychiatric/Behavioral: Negative for depression. The patient is not nervous/anxious.        Objective:    Physical Exam  Constitutional: He is oriented to person, place, and time. He appears well-developed and well-nourished.  HENT:  Right Ear: External ear normal.  Left Ear: External ear normal.  Nose: Right sinus exhibits frontal sinus tenderness. Left sinus exhibits frontal sinus tenderness.  Mouth/Throat: Posterior oropharyngeal erythema present.   + PND + errythema  Eyes: Conjunctivae are normal. Right eye exhibits no discharge. Left eye exhibits no discharge.  Cardiovascular: Normal rate, regular rhythm and normal heart sounds.  No murmur heard. Pulmonary/Chest: Effort normal and breath sounds normal. No respiratory distress. He has no wheezes. He has no rales. He exhibits no tenderness.  Musculoskeletal: He exhibits edema and tenderness.       Right knee: He exhibits swelling. Tenderness found.  Lymphadenopathy:    He has cervical adenopathy.  Neurological: He is alert and oriented to person, place, and time.  Nursing note and vitals reviewed.   BP 130/78 (BP Location: Left Arm, Cuff Size: Large)   Pulse (!) 108   Temp 98 F (36.7 C) (Oral)   Resp 16   Ht 5\' 9"  (1.753 m)   Wt 269 lb (122 kg)   SpO2 95%   BMI 39.72 kg/m  Wt Readings from Last 3 Encounters:  06/02/17 269 lb (122 kg)  03/07/17 265 lb (120.2 kg)  03/07/17 271 lb 9.6 oz (123.2 kg)   BP Readings from Last 3 Encounters:  06/02/17 130/78  03/07/17 136/86  03/07/17 138/82     Immunization History  Administered Date(s) Administered  . Pneumococcal Conjugate-13 04/24/2013  . Pneumococcal Polysaccharide-23 01/27/2015  . Td 05/05/2009  . Tdap 06/14/2006, 05/05/2009  . Zoster 05/05/2009    Health Maintenance  Topic Date Due  . COLONOSCOPY  04/12/2014  . FOOT EXAM  08/24/2016  . OPHTHALMOLOGY EXAM  04/26/2017  . INFLUENZA VACCINE  08/01/2027 (Originally 11/10/2016)  . HEMOGLOBIN A1C  09/04/2017  . URINE MICROALBUMIN  03/07/2018  . TETANUS/TDAP  05/06/2019  . Hepatitis C Screening  Completed  . PNA vac Low Risk Adult  Completed    Lab Results  Component Value Date   WBC 3.5 (L) 01/27/2015   HGB 14.4 01/27/2015   HCT 43.7 01/27/2015   PLT 234.0 01/27/2015   GLUCOSE 125 (H) 03/07/2017   CHOL 172 03/07/2017   TRIG 122.0 03/07/2017   HDL 47.20 03/07/2017   LDLDIRECT 114.4 08/24/2006   LDLCALC 101 (H) 03/07/2017   ALT 14 03/07/2017   AST 14  03/07/2017   NA 137 03/07/2017   K 4.1 03/07/2017   CL 104 03/07/2017   CREATININE 0.93 03/07/2017   BUN 13 03/07/2017   CO2 26 03/07/2017   TSH 0.78 05/05/2009   PSA 1.72 01/27/2015   HGBA1C 7.8 (H) 03/07/2017   MICROALBUR <0.7 03/07/2017    Lab Results  Component Value Date   TSH 0.78 05/05/2009   Lab Results  Component Value Date   WBC 3.5 (L) 01/27/2015   HGB 14.4 01/27/2015   HCT 43.7 01/27/2015   MCV 85.9 01/27/2015   PLT 234.0 01/27/2015   Lab Results  Component Value Date   NA 137 03/07/2017  K 4.1 03/07/2017   CO2 26 03/07/2017   GLUCOSE 125 (H) 03/07/2017   BUN 13 03/07/2017   CREATININE 0.93 03/07/2017   BILITOT 0.7 03/07/2017   ALKPHOS 65 03/07/2017   AST 14 03/07/2017   ALT 14 03/07/2017   PROT 6.9 03/07/2017   ALBUMIN 3.6 03/07/2017   CALCIUM 9.1 03/07/2017   GFR 103.47 03/07/2017   Lab Results  Component Value Date   CHOL 172 03/07/2017   Lab Results  Component Value Date   HDL 47.20 03/07/2017   Lab Results  Component Value Date   LDLCALC 101 (H) 03/07/2017   Lab Results  Component Value Date   TRIG 122.0 03/07/2017   Lab Results  Component Value Date   CHOLHDL 4 03/07/2017   Lab Results  Component Value Date   HGBA1C 7.8 (H) 03/07/2017         Assessment & Plan:   Problem List Items Addressed This Visit      Unprioritized   Acute pain of right knee   Relevant Medications   traMADol (ULTRAM) 50 MG tablet   Other Relevant Orders   Ambulatory referral to Orthopedic Surgery    Other Visit Diagnoses    Bronchitis    -  Primary   Relevant Medications   promethazine-dextromethorphan (PROMETHAZINE-DM) 6.25-15 MG/5ML syrup   azithromycin (ZITHROMAX Z-PAK) 250 MG tablet   loratadine (CLARITIN) 10 MG tablet      I am having Vinnie Level start on promethazine-dextromethorphan, azithromycin, loratadine, and traMADol. I am also having him maintain his glucose blood and metFORMIN.  Meds ordered this encounter    Medications  . promethazine-dextromethorphan (PROMETHAZINE-DM) 6.25-15 MG/5ML syrup    Sig: Take 5 mLs by mouth 4 (four) times daily as needed.    Dispense:  118 mL    Refill:  0  . azithromycin (ZITHROMAX Z-PAK) 250 MG tablet    Sig: As directed    Dispense:  6 each    Refill:  0  . loratadine (CLARITIN) 10 MG tablet    Sig: Take 1 tablet (10 mg total) by mouth daily.    Dispense:  30 tablet    Refill:  11  . traMADol (ULTRAM) 50 MG tablet    Sig: Take 1 tablet (50 mg total) by mouth every 8 (eight) hours as needed.    Dispense:  30 tablet    Refill:  0    CMA served as scribe during this visit. History, Physical and Plan performed by medical provider. Documentation and orders reviewed and attested to.  Ann Held, DO

## 2017-06-02 NOTE — Progress Notes (Deleted)
Patient ID: Calvin Chandler, male    DOB: 1945/07/21  Age: 72 y.o. MRN: [id number]    Subjective:  Subjective  HPI CHADEN DOOM presents for ***  Review of Systems  History Past Medical History:  Diagnosis Date  . Anxiety   . Depression   . Diabetes mellitus without complication ([place])   . Hyperlipidemia     He has a past surgical history that includes No past surgeries.   His family history includes Clotting disorder in his unknown relative; Heart disease in his mother; Hypertension in his unknown relative; Prostate cancer in his father; Pulmonary embolism in his father; Stroke in his unknown relative; Transient ischemic attack in his sister.He reports that he quit smoking about 36 years ago. He has a 6.50 pack-year smoking history. he has never used smokeless tobacco. He reports that he does not drink alcohol or use drugs.  Current Outpatient Medications on File Prior to Visit  Medication Sig Dispense Refill  . glucose blood (ONETOUCH VERIO) test strip Check blood sugars daily or as needed.  DX:E11.51 100 each 12  . metFORMIN (GLUCOPHAGE XR) 500 MG 24 hr tablet Take 1 tablet (500 mg total) by mouth every evening. 30 tablet 2   No current facility-administered medications on file prior to visit.      Objective:  Objective  Physical Exam BP 130/78 (BP Location: Left Arm, Cuff Size: Large)   Pulse (!) 108   Temp 98 F (36.7 C) (Oral)   Resp 16   Ht 5\' 9"  (1.753 m)   Wt 269 lb (122 kg)   SpO2 95%   BMI 39.72 kg/m  Wt Readings from Last 3 Encounters:  06/02/17 269 lb (122 kg)  03/07/17 265 lb (120.2 kg)  03/07/17 271 lb 9.6 oz (123.2 kg)     Lab Results  Component Value Date   WBC 3.5 (L) 01/27/2015   HGB 14.4 01/27/2015   HCT 43.7 01/27/2015   PLT 234.0 01/27/2015   GLUCOSE 125 (H) 03/07/2017   CHOL 172 03/07/2017   TRIG 122.0 03/07/2017   HDL 47.20 03/07/2017   LDLDIRECT 114.4 08/24/2006   LDLCALC 101 (H) 03/07/2017   ALT 14 03/07/2017   AST 14  03/07/2017   NA 137 03/07/2017   K 4.1 03/07/2017   CL 104 03/07/2017   CREATININE 0.93 03/07/2017   BUN 13 03/07/2017   CO2 26 03/07/2017   TSH 0.78 05/05/2009   PSA 1.72 01/27/2015   HGBA1C 7.8 (H) 03/07/2017   MICROALBUR <0.7 03/07/2017    No results found.   Assessment & Plan:  Plan  I am having Vinnie Level maintain his glucose blood and metFORMIN.  No orders of the defined types were placed in this encounter.   Problem List Items Addressed This Visit    None      Follow-up: No Follow-up on file.  Ann Held, DO

## 2017-06-02 NOTE — Patient Instructions (Signed)

## 2017-06-06 ENCOUNTER — Ambulatory Visit: Payer: Self-pay | Admitting: Hematology

## 2017-06-06 NOTE — Telephone Encounter (Signed)
Quarter sized, raised bumps.  Itchy.  Not open, or oozing anything.  Home care instructions given.  Patient advised of when to call back or seek emergency care.  Patient verbalizes understanding.  Reason for Disposition . Mild localized rash  Answer Assessment - Initial Assessment Questions 2.) LOCATION:  "Is it widespread or localized? Localized to two spots on the left arm (two finger widths apart) 3.) NEW MEDICATIONS: "Are you taking any new medicine?" Started z-pak and tramadol on Friday  Protocols used: RASH OR REDNESS - [place], [place]

## 2017-06-07 ENCOUNTER — Ambulatory Visit: Payer: BC Managed Care – PPO | Admitting: Family Medicine

## 2017-06-21 DIAGNOSIS — M25561 Pain in right knee: Secondary | ICD-10-CM | POA: Diagnosis not present

## 2017-06-30 ENCOUNTER — Encounter: Payer: Self-pay | Admitting: Family Medicine

## 2017-06-30 ENCOUNTER — Ambulatory Visit (INDEPENDENT_AMBULATORY_CARE_PROVIDER_SITE_OTHER): Payer: BC Managed Care – PPO | Admitting: Family Medicine

## 2017-06-30 VITALS — BP 128/74 | HR 93 | Temp 97.6°F | Resp 16 | Ht 68.9 in | Wt 267.2 lb

## 2017-06-30 DIAGNOSIS — E1151 Type 2 diabetes mellitus with diabetic peripheral angiopathy without gangrene: Secondary | ICD-10-CM | POA: Diagnosis not present

## 2017-06-30 DIAGNOSIS — E785 Hyperlipidemia, unspecified: Secondary | ICD-10-CM

## 2017-06-30 DIAGNOSIS — IMO0002 Reserved for concepts with insufficient information to code with codable children: Secondary | ICD-10-CM

## 2017-06-30 DIAGNOSIS — Z0000 Encounter for general adult medical examination without abnormal findings: Secondary | ICD-10-CM | POA: Insufficient documentation

## 2017-06-30 DIAGNOSIS — E1165 Type 2 diabetes mellitus with hyperglycemia: Secondary | ICD-10-CM

## 2017-06-30 NOTE — Progress Notes (Signed)
Subjective:  I acted as a Education administrator for [employer]. Calvin Chandler, [place]   Patient ID: Calvin Chandler, male    DOB: August 05, 1945, 72 y.o.   MRN: [id number]  Chief Complaint  Patient presents with  . Diabetes    HPI  Patient is in today for follow up on diabetes mellitus.  Patient Care Team: Carollee Herter, Alferd Apa, DO as PCP - General   Past Medical History:  Diagnosis Date  . Anxiety   . Depression   . Diabetes mellitus without complication ([place])   . Hyperlipidemia     Past Surgical History:  Procedure Laterality Date  . NO PAST SURGERIES      Family History  Problem Relation Age of Onset  . Prostate cancer Father   . Pulmonary embolism Father   . Hypertension Unknown   . Stroke Unknown   . Transient ischemic attack Sister   . Clotting disorder Unknown        Thromboembolism clotting disorder  . Heart disease Mother     Social History   Socioeconomic History  . Marital status: Married    Spouse name: Not on file  . Number of children: Not on file  . Years of education: Not on file  . Highest education level: Not on file  Occupational History  . Not on file  Social Needs  . Financial resource strain: Not on file  . Food insecurity:    Worry: Not on file    Inability: Not on file  . Transportation needs:    Medical: Not on file    Non-medical: Not on file  Tobacco Use  . Smoking status: Former Smoker    Packs/day: 0.50    Years: 13.00    Pack years: 6.50    Last attempt to quit: 03/03/1981    Years since quitting: 36.3  . Smokeless tobacco: Never Used  Substance and Sexual Activity  . Alcohol use: No  . Drug use: No  . Sexual activity: Not on file  Lifestyle  . Physical activity:    Days per week: Not on file    Minutes per session: Not on file  . Stress: Not on file  Relationships  . Social connections:    Talks on phone: Not on file    Gets together: Not on file    Attends religious service: Not on file    Active member of club or  organization: Not on file    Attends meetings of clubs or organizations: Not on file    Relationship status: Not on file  . Intimate partner violence:    Fear of current or ex partner: Not on file    Emotionally abused: Not on file    Physically abused: Not on file    Forced sexual activity: Not on file  Other Topics Concern  . Not on file  Social History Narrative  . Not on file    Outpatient Medications Prior to Visit  Medication Sig Dispense Refill  . azithromycin (ZITHROMAX Z-PAK) 250 MG tablet As directed 6 each 0  . glucose blood (ONETOUCH VERIO) test strip Check blood sugars daily or as needed.  DX:E11.51 100 each 38  . loratadine (CLARITIN) 10 MG tablet Take 1 tablet (10 mg total) by mouth daily. 30 tablet 11  . metFORMIN (GLUCOPHAGE XR) 500 MG 24 hr tablet Take 1 tablet (500 mg total) by mouth every evening. 30 tablet 2  . promethazine-dextromethorphan (PROMETHAZINE-DM) 6.25-15 MG/5ML syrup Take 5 mLs by mouth 4 (  four) times daily as needed. 118 mL 0  . traMADol (ULTRAM) 50 MG tablet Take 1 tablet (50 mg total) by mouth every 8 (eight) hours as needed. 30 tablet 0   No facility-administered medications prior to visit.     No Known Allergies  Review of Systems  Constitutional: Negative for chills, fever and malaise/fatigue.  HENT: Negative for congestion and hearing loss.   Eyes: Negative for blurred vision and discharge.  Respiratory: Negative for cough, sputum production and shortness of breath.   Cardiovascular: Negative for chest pain, palpitations and leg swelling.  Gastrointestinal: Negative for abdominal pain, blood in stool, constipation, diarrhea, heartburn, nausea and vomiting.  Genitourinary: Negative for dysuria, frequency, hematuria and urgency.  Musculoskeletal: Negative for back pain, falls and myalgias.  Skin: Negative for rash.  Neurological: Negative for dizziness, sensory change, loss of consciousness, weakness and headaches.  Endo/Heme/Allergies:  Negative for environmental allergies. Does not bruise/bleed easily.  Psychiatric/Behavioral: Negative for depression and suicidal ideas. The patient is not nervous/anxious and does not have insomnia.        Objective:    Physical Exam  Constitutional: He is oriented to person, place, and time. Vital signs are normal. He appears well-developed and well-nourished. He is sleeping.  HENT:  Head: Normocephalic and atraumatic.  Mouth/Throat: Oropharynx is clear and moist.  Eyes: Pupils are equal, round, and reactive to light. EOM are normal.  Neck: Normal range of motion. Neck supple. No thyromegaly present.  Cardiovascular: Normal rate and regular rhythm.  No murmur heard. Pulmonary/Chest: Effort normal and breath sounds normal. No respiratory distress. He has no wheezes. He has no rales. He exhibits no tenderness.  Musculoskeletal: He exhibits no edema or tenderness.  Neurological: He is alert and oriented to person, place, and time.  Skin: Skin is warm and dry.  Psychiatric: He has a normal mood and affect. His behavior is normal. Judgment and thought content normal.  Nursing note and vitals reviewed.   BP 128/74 (BP Location: Left Arm, Patient Position: Sitting, Cuff Size: Large)   Pulse 93   Temp 97.6 F (36.4 C) (Oral)   Resp 16   Ht 5' 8.9" (1.75 m)   Wt 267 lb 3.2 oz (121.2 kg)   SpO2 97%   BMI 39.58 kg/m  Wt Readings from Last 3 Encounters:  06/30/17 267 lb 3.2 oz (121.2 kg)  06/02/17 269 lb (122 kg)  03/07/17 265 lb (120.2 kg)   BP Readings from Last 3 Encounters:  06/30/17 128/74  06/02/17 130/78  03/07/17 136/86     Immunization History  Administered Date(s) Administered  . Pneumococcal Conjugate-13 04/24/2013  . Pneumococcal Polysaccharide-23 01/27/2015  . Td 05/05/2009  . Tdap 06/14/2006, 05/05/2009  . Zoster 05/05/2009    Health Maintenance  Topic Date Due  . COLONOSCOPY  04/12/2014  . OPHTHALMOLOGY EXAM  04/26/2017  . FOOT EXAM  06/30/2017  (Originally 08/24/2016)  . INFLUENZA VACCINE  08/01/2027 (Originally 11/10/2016)  . HEMOGLOBIN A1C  09/04/2017  . URINE MICROALBUMIN  03/07/2018  . TETANUS/TDAP  05/06/2019  . Hepatitis C Screening  Completed  . PNA vac Low Risk Adult  Completed    Lab Results  Component Value Date   WBC 3.5 (L) 01/27/2015   HGB 14.4 01/27/2015   HCT 43.7 01/27/2015   PLT 234.0 01/27/2015   GLUCOSE 125 (H) 03/07/2017   CHOL 172 03/07/2017   TRIG 122.0 03/07/2017   HDL 47.20 03/07/2017   LDLDIRECT 114.4 08/24/2006   LDLCALC 101 (H) 03/07/2017  ALT 14 03/07/2017   AST 14 03/07/2017   NA 137 03/07/2017   K 4.1 03/07/2017   CL 104 03/07/2017   CREATININE 0.93 03/07/2017   BUN 13 03/07/2017   CO2 26 03/07/2017   TSH 0.78 05/05/2009   PSA 1.72 01/27/2015   HGBA1C 7.8 (H) 03/07/2017   MICROALBUR <0.7 03/07/2017    Lab Results  Component Value Date   TSH 0.78 05/05/2009   Lab Results  Component Value Date   WBC 3.5 (L) 01/27/2015   HGB 14.4 01/27/2015   HCT 43.7 01/27/2015   MCV 85.9 01/27/2015   PLT 234.0 01/27/2015   Lab Results  Component Value Date   NA 137 03/07/2017   K 4.1 03/07/2017   CO2 26 03/07/2017   GLUCOSE 125 (H) 03/07/2017   BUN 13 03/07/2017   CREATININE 0.93 03/07/2017   BILITOT 0.7 03/07/2017   ALKPHOS 65 03/07/2017   AST 14 03/07/2017   ALT 14 03/07/2017   PROT 6.9 03/07/2017   ALBUMIN 3.6 03/07/2017   CALCIUM 9.1 03/07/2017   GFR 103.47 03/07/2017   Lab Results  Component Value Date   CHOL 172 03/07/2017   Lab Results  Component Value Date   HDL 47.20 03/07/2017   Lab Results  Component Value Date   LDLCALC 101 (H) 03/07/2017   Lab Results  Component Value Date   TRIG 122.0 03/07/2017   Lab Results  Component Value Date   CHOLHDL 4 03/07/2017   Lab Results  Component Value Date   HGBA1C 7.8 (H) 03/07/2017         Assessment & Plan:   Problem List Items Addressed This Visit      Unprioritized   DM (diabetes mellitus) type II,  controlled, with peripheral vascular disorder ([place])    [facility]      Hyperlipidemia LDL goal <70    Encouraged heart healthy diet, increase exercise, avoid trans fats, consider a krill oil cap daily  Per [facility]      Preventative health care - Primary    ghm utd Check labs  See AVS      Relevant Orders   Ambulatory referral to Gastroenterology    Other Visit Diagnoses    DM (diabetes mellitus) type II uncontrolled, periph vascular disorder ([place])   (Chronic)        I am having Vinnie Level maintain his glucose blood, metFORMIN, promethazine-dextromethorphan, azithromycin, loratadine, and traMADol.  No orders of the defined types were placed in this encounter.   CMA served as Education administrator during this visit. History, Physical and Plan performed by medical provider. Documentation and orders reviewed and attested to.  Ann Held, DO

## 2017-06-30 NOTE — Assessment & Plan Note (Signed)
Labs done by [facility]

## 2017-06-30 NOTE — Assessment & Plan Note (Signed)
ghm utd Check labs See AVS 

## 2017-06-30 NOTE — Patient Instructions (Signed)

## 2017-06-30 NOTE — Assessment & Plan Note (Signed)
Encouraged heart healthy diet, increase exercise, avoid trans fats, consider a krill oil cap daily  Per [place]

## 2017-07-01 DIAGNOSIS — M8438XD Stress fracture, other site, subsequent encounter for fracture with routine healing: Secondary | ICD-10-CM | POA: Diagnosis not present

## 2017-07-01 DIAGNOSIS — M1711 Unilateral primary osteoarthritis, right knee: Secondary | ICD-10-CM | POA: Diagnosis not present

## 2017-07-27 DIAGNOSIS — Z0289 Encounter for other administrative examinations: Secondary | ICD-10-CM | POA: Diagnosis not present

## 2017-08-15 ENCOUNTER — Encounter: Payer: Self-pay | Admitting: Gastroenterology

## 2017-08-15 ENCOUNTER — Telehealth: Payer: Self-pay | Admitting: Gastroenterology

## 2017-08-15 NOTE — Telephone Encounter (Signed)
OK 

## 2017-08-17 ENCOUNTER — Encounter: Payer: Self-pay | Admitting: Gastroenterology

## 2017-10-04 ENCOUNTER — Ambulatory Visit (INDEPENDENT_AMBULATORY_CARE_PROVIDER_SITE_OTHER): Payer: BC Managed Care – PPO | Admitting: Family Medicine

## 2017-10-04 ENCOUNTER — Encounter: Payer: Self-pay | Admitting: Family Medicine

## 2017-10-04 VITALS — BP 132/86 | HR 80 | Temp 98.0°F | Resp 16 | Wt 268.0 lb

## 2017-10-04 DIAGNOSIS — R0683 Snoring: Secondary | ICD-10-CM | POA: Diagnosis not present

## 2017-10-04 DIAGNOSIS — Z23 Encounter for immunization: Secondary | ICD-10-CM | POA: Diagnosis not present

## 2017-10-04 DIAGNOSIS — T148XXA Other injury of unspecified body region, initial encounter: Secondary | ICD-10-CM | POA: Diagnosis not present

## 2017-10-04 DIAGNOSIS — M1711 Unilateral primary osteoarthritis, right knee: Secondary | ICD-10-CM | POA: Diagnosis not present

## 2017-10-04 DIAGNOSIS — M25561 Pain in right knee: Secondary | ICD-10-CM | POA: Diagnosis not present

## 2017-10-04 NOTE — Patient Instructions (Signed)

## 2017-10-04 NOTE — Progress Notes (Signed)
Subjective:  I acted as a Education administrator for [employer]. [name], [place]   Patient ID: Calvin Chandler, male    DOB: [birth year], 72 y.o.   MRN: [id number]  Chief Complaint  Patient presents with  . Sleep Apnea    on going for 1 year    HPI  Patient is in today for sleep apnea.  His wife states he is snoring a lot and he stops breathing.   He also fell this week---just a misstep-- he has a stress fracture in his r leg--- he scratched up his R forearm.    Patient Care Team: Carollee Herter, Alferd Apa, DO as PCP - General   Past Medical History:  Diagnosis Date  . Anxiety   . Depression   . Diabetes mellitus without complication ([place])   . Hyperlipidemia     Past Surgical History:  Procedure Laterality Date  . NO PAST SURGERIES      Family History  Problem Relation Age of Onset  . Prostate cancer Father   . Pulmonary embolism Father   . Hypertension Unknown   . Stroke Unknown   . Transient ischemic attack Sister   . Clotting disorder Unknown        Thromboembolism clotting disorder  . Heart disease Mother     Social History   Socioeconomic History  . Marital status: Married    Spouse name: Not on file  . Number of children: Not on file  . Years of education: Not on file  . Highest education level: Not on file  Occupational History  . Not on file  Social Needs  . Financial resource strain: Not on file  . Food insecurity:    Worry: Not on file    Inability: Not on file  . Transportation needs:    Medical: Not on file    Non-medical: Not on file  Tobacco Use  . Smoking status: Former Smoker    Packs/day: 0.50    Years: 13.00    Pack years: 6.50    Last attempt to quit: 03/03/1981    Years since quitting: 36.6  . Smokeless tobacco: Never Used  Substance and Sexual Activity  . Alcohol use: No  . Drug use: No  . Sexual activity: Not on file  Lifestyle  . Physical activity:    Days per week: Not on file    Minutes per session: Not on file  . Stress: Not on  file  Relationships  . Social connections:    Talks on phone: Not on file    Gets together: Not on file    Attends religious service: Not on file    Active member of club or organization: Not on file    Attends meetings of clubs or organizations: Not on file    Relationship status: Not on file  . Intimate partner violence:    Fear of current or ex partner: Not on file    Emotionally abused: Not on file    Physically abused: Not on file    Forced sexual activity: Not on file  Other Topics Concern  . Not on file  Social History Narrative  . Not on file    Outpatient Medications Prior to Visit  Medication Sig Dispense Refill  . glucose blood (ONETOUCH VERIO) test strip Check blood sugars daily or as needed.  DX:E11.51 100 each 38  . loratadine (CLARITIN) 10 MG tablet Take 1 tablet (10 mg total) by mouth daily. 30 tablet 11  . metFORMIN (GLUCOPHAGE  XR) 500 MG 24 hr tablet Take 1 tablet (500 mg total) by mouth every evening. 30 tablet 2  . azithromycin (ZITHROMAX Z-PAK) 250 MG tablet As directed 6 each 0  . promethazine-dextromethorphan (PROMETHAZINE-DM) 6.25-15 MG/5ML syrup Take 5 mLs by mouth 4 (four) times daily as needed. 118 mL 0  . traMADol (ULTRAM) 50 MG tablet Take 1 tablet (50 mg total) by mouth every 8 (eight) hours as needed. 30 tablet 0   No facility-administered medications prior to visit.     No Known Allergies  Review of Systems  Constitutional: Negative for chills, fever and malaise/fatigue.  HENT: Negative for congestion and hearing loss.   Eyes: Negative for discharge.  Respiratory: Negative for cough, sputum production and shortness of breath.   Cardiovascular: Negative for chest pain, palpitations and leg swelling.  Gastrointestinal: Negative for abdominal pain, blood in stool, constipation, diarrhea, heartburn, nausea and vomiting.  Genitourinary: Negative for dysuria, frequency, hematuria and urgency.  Musculoskeletal: Negative for back pain, falls and  myalgias.  Skin: Negative for rash.  Neurological: Negative for dizziness, sensory change, loss of consciousness, weakness and headaches.  Endo/Heme/Allergies: Negative for environmental allergies. Does not bruise/bleed easily.  Psychiatric/Behavioral: Negative for depression and suicidal ideas. The patient is not nervous/anxious and does not have insomnia.        Objective:    Physical Exam  Constitutional: He is oriented to person, place, and time. Vital signs are normal. He appears well-developed and well-nourished. He is sleeping.  HENT:  Head: Normocephalic and atraumatic.  Mouth/Throat: Oropharynx is clear and moist.  Eyes: Pupils are equal, round, and reactive to light. EOM are normal.  Neck: Normal range of motion. Neck supple. No thyromegaly present.  Cardiovascular: Normal rate and regular rhythm.  No murmur heard. Pulmonary/Chest: Effort normal and breath sounds normal. No respiratory distress. He has no wheezes. He has no rales. He exhibits no tenderness.  Musculoskeletal: He exhibits no edema or tenderness.  Neurological: He is alert and oriented to person, place, and time.  Skin: Skin is warm and dry.     Psychiatric: He has a normal mood and affect. His behavior is normal. Judgment and thought content normal.  Nursing note and vitals reviewed.   BP 132/86 (BP Location: Left Arm, Patient Position: Sitting, Cuff Size: Large)   Pulse 80   Temp 98 F (36.7 C) (Oral)   Resp 16   Wt 268 lb (121.6 kg)   SpO2 98%   BMI 39.69 kg/m  Wt Readings from Last 3 Encounters:  10/04/17 268 lb (121.6 kg)  06/30/17 267 lb 3.2 oz (121.2 kg)  06/02/17 269 lb (122 kg)   BP Readings from Last 3 Encounters:  10/04/17 132/86  06/30/17 128/74  06/02/17 130/78     Immunization History  Administered Date(s) Administered  . Pneumococcal Conjugate-13 04/24/2013  . Pneumococcal Polysaccharide-23 01/27/2015  . Td 05/05/2009  . Tdap 06/14/2006, 05/05/2009, 10/04/2017  . Zoster  05/05/2009    Health Maintenance  Topic Date Due  . COLONOSCOPY  04/12/2014  . FOOT EXAM  08/24/2016  . OPHTHALMOLOGY EXAM  04/26/2017  . HEMOGLOBIN A1C  09/04/2017  . INFLUENZA VACCINE  08/01/2027 (Originally 11/10/2017)  . URINE MICROALBUMIN  03/07/2018  . TETANUS/TDAP  10/05/2027  . Hepatitis C Screening  Completed  . PNA vac Low Risk Adult  Completed    Lab Results  Component Value Date   WBC 3.5 (L) 01/27/2015   HGB 14.4 01/27/2015   HCT 43.7 01/27/2015   PLT  234.0 01/27/2015   GLUCOSE 125 (H) 03/07/2017   CHOL 172 03/07/2017   TRIG 122.0 03/07/2017   HDL 47.20 03/07/2017   LDLDIRECT 114.4 08/24/2006   LDLCALC 101 (H) 03/07/2017   ALT 14 03/07/2017   AST 14 03/07/2017   NA 137 03/07/2017   K 4.1 03/07/2017   CL 104 03/07/2017   CREATININE 0.93 03/07/2017   BUN 13 03/07/2017   CO2 26 03/07/2017   TSH 0.78 05/05/2009   PSA 1.72 01/27/2015   HGBA1C 7.8 (H) 03/07/2017   MICROALBUR <0.7 03/07/2017    Lab Results  Component Value Date   TSH 0.78 05/05/2009   Lab Results  Component Value Date   WBC 3.5 (L) 01/27/2015   HGB 14.4 01/27/2015   HCT 43.7 01/27/2015   MCV 85.9 01/27/2015   PLT 234.0 01/27/2015   Lab Results  Component Value Date   NA 137 03/07/2017   K 4.1 03/07/2017   CO2 26 03/07/2017   GLUCOSE 125 (H) 03/07/2017   BUN 13 03/07/2017   CREATININE 0.93 03/07/2017   BILITOT 0.7 03/07/2017   ALKPHOS 65 03/07/2017   AST 14 03/07/2017   ALT 14 03/07/2017   PROT 6.9 03/07/2017   ALBUMIN 3.6 03/07/2017   CALCIUM 9.1 03/07/2017   GFR 103.47 03/07/2017   Lab Results  Component Value Date   CHOL 172 03/07/2017   Lab Results  Component Value Date   HDL 47.20 03/07/2017   Lab Results  Component Value Date   LDLCALC 101 (H) 03/07/2017   Lab Results  Component Value Date   TRIG 122.0 03/07/2017   Lab Results  Component Value Date   CHOLHDL 4 03/07/2017   Lab Results  Component Value Date   HGBA1C 7.8 (H) 03/07/2017          Assessment & Plan:   Problem List Items Addressed This Visit    None    Visit Diagnoses    Snoring    -  Primary   Relevant Orders   Ambulatory referral to Pulmonology   Skin abrasion       Relevant Orders   Tdap vaccine greater than or equal to 7yo IM (Completed)      I have discontinued Reedy M. Cisar's promethazine-dextromethorphan, azithromycin, and traMADol. I am also having him maintain his glucose blood, metFORMIN, and loratadine.  No orders of the defined types were placed in this encounter.   CMA served as Education administrator during this visit. History, Physical and Plan performed by medical provider. Documentation and orders reviewed and attested to.  Ann Held, DO

## 2017-10-12 ENCOUNTER — Ambulatory Visit (AMBULATORY_SURGERY_CENTER): Payer: Self-pay | Admitting: *Deleted

## 2017-10-12 ENCOUNTER — Other Ambulatory Visit: Payer: Self-pay

## 2017-10-12 VITALS — Ht 69.0 in | Wt 265.0 lb

## 2017-10-12 DIAGNOSIS — Z1211 Encounter for screening for malignant neoplasm of colon: Secondary | ICD-10-CM

## 2017-10-12 MED ORDER — SUPREP BOWEL PREP KIT 17.5-3.13-1.6 GM/177ML PO SOLN: 1.0000 | Freq: Once | ORAL | 0 refills | Status: AC

## 2017-10-12 NOTE — Progress Notes (Signed)
No egg or soy allergy known to patient  Patient has never been intubated . Patient moves neck without difficulty No diet pills per patient No home 02 use per patient  No blood thinners per patient  Pt denies issues with constipation  No A fib or A flutter  EMMI video sent to pt's e mail  Patient will not take any blood products

## 2017-10-18 ENCOUNTER — Encounter: Payer: Self-pay | Admitting: *Deleted

## 2017-10-26 ENCOUNTER — Ambulatory Visit (AMBULATORY_SURGERY_CENTER): Payer: BC Managed Care – PPO | Admitting: Gastroenterology

## 2017-10-26 ENCOUNTER — Other Ambulatory Visit: Payer: Self-pay

## 2017-10-26 ENCOUNTER — Encounter: Payer: Self-pay | Admitting: Gastroenterology

## 2017-10-26 VITALS — BP 126/76 | HR 76 | Temp 98.4°F | Resp 17 | Ht 69.0 in | Wt 265.0 lb

## 2017-10-26 DIAGNOSIS — D124 Benign neoplasm of descending colon: Secondary | ICD-10-CM

## 2017-10-26 DIAGNOSIS — Z1211 Encounter for screening for malignant neoplasm of colon: Secondary | ICD-10-CM | POA: Diagnosis not present

## 2017-10-26 DIAGNOSIS — D122 Benign neoplasm of ascending colon: Secondary | ICD-10-CM

## 2017-10-26 HISTORY — PX: COLONOSCOPY: SHX174

## 2017-10-26 MED ORDER — SODIUM CHLORIDE 0.9 % IV SOLN: 500.0000 mL | Freq: Once | INTRAVENOUS | Status: DC

## 2017-10-26 NOTE — Progress Notes (Signed)
Called to room to assist during endoscopic procedure.  Patient ID and intended procedure confirmed with present staff. Received instructions for my participation in the procedure from the performing physician.  

## 2017-10-26 NOTE — Patient Instructions (Signed)
*  Handouts given on polyps diverticulosis hemorrhoids and high fiber diet  YOU HAD AN ENDOSCOPIC PROCEDURE TODAY AT [facility]:   Refer to the procedure report that was given to you for any specific questions about what was found during the examination.  If the procedure report does not answer your questions, please call your gastroenterologist to clarify.  If you requested that your care partner not be given the details of your procedure findings, then the procedure report has been included in a sealed envelope for you to review at your convenience later.  YOU SHOULD EXPECT: Some feelings of bloating in the abdomen. Passage of more gas than usual.  Walking can help get rid of the air that was put into your GI tract during the procedure and reduce the bloating. If you had a lower endoscopy (such as a colonoscopy or flexible sigmoidoscopy) you may notice spotting of blood in your stool or on the toilet paper. If you underwent a bowel prep for your procedure, you may not have a normal bowel movement for a few days.  Please Note:  You might notice some irritation and congestion in your nose or some drainage.  This is from the oxygen used during your procedure.  There is no need for concern and it should clear up in a day or so.  SYMPTOMS TO REPORT IMMEDIATELY:   Following lower endoscopy (colonoscopy or flexible sigmoidoscopy):  Excessive amounts of blood in the stool  Significant tenderness or worsening of abdominal pains  Swelling of the abdomen that is new, acute  Fever of 100F or higher   For urgent or emergent issues, a gastroenterologist can be reached at any hour by calling [phone].   DIET:  We do recommend a small meal at first, but then you may proceed to your regular diet.  Drink plenty of fluids but you should avoid alcoholic beverages for 24 hours.  ACTIVITY:  You should plan to take it easy for the rest of today and you should NOT DRIVE or use heavy machinery  until tomorrow (because of the sedation medicines used during the test).    FOLLOW UP: Our staff will call the number listed on your records the next business day following your procedure to check on you and address any questions or concerns that you may have regarding the information given to you following your procedure. If we do not reach you, we will leave a message.  However, if you are feeling well and you are not experiencing any problems, there is no need to return our call.  We will assume that you have returned to your regular daily activities without incident.  If any biopsies were taken you will be contacted by phone or by letter within the next 1-3 weeks.  Please call us at [phone] if you have not heard about the biopsies in 3 weeks.    SIGNATURES/CONFIDENTIALITY: You and/or your care partner have signed paperwork which will be entered into your electronic medical record.  These signatures attest to the fact that that the information above on your After Visit Summary has been reviewed and is understood.  Full responsibility of the confidentiality of this discharge information lies with you and/or your care-partner.

## 2017-10-26 NOTE — Op Note (Signed)
[facility] Patient Name: Calvin Chandler Procedure Date: 10/26/2017 8:07 AM MRN: [id number] Endoscopist: Ladene Artist , MD Age: 72 Referring MD:  Date of Birth: 1945/11/05 Gender: Male Account #: [id number] Procedure:                Colonoscopy Indications:              Screening for colorectal malignant neoplasm Medicines:                Monitored Anesthesia Care Procedure:                Pre-Anesthesia Assessment:                           - Prior to the procedure, a History and Physical                            was performed, and patient medications and                            allergies were reviewed. The patient's tolerance of                            previous anesthesia was also reviewed. The risks                            and benefits of the procedure and the sedation                            options and risks were discussed with the patient.                            All questions were answered, and informed consent                            was obtained. Prior Anticoagulants: The patient has                            taken no previous anticoagulant or antiplatelet                            agents. ASA Grade Assessment: II - A patient with                            mild systemic disease. After reviewing the risks                            and benefits, the patient was deemed in                            satisfactory condition to undergo the procedure.                           After obtaining informed consent, the colonoscope  was passed under direct vision. Throughout the                            procedure, the patient's blood pressure, pulse, and                            oxygen saturations were monitored continuously. The                            Model PCF-[id number]) scope was introduced                            through the anus and advanced to the the cecum,                            identified by  appendiceal orifice and ileocecal                            valve. The ileocecal valve, appendiceal orifice,                            and rectum were photographed. The quality of the                            bowel preparation was excellent. The colonoscopy                            was performed without difficulty. The patient                            tolerated the procedure well. Scope In: 8:14:35 AM Scope Out: 8:29:34 AM Scope Withdrawal Time: 0 hours 11 minutes 50 seconds  Total Procedure Duration: 0 hours 14 minutes 59 seconds  Findings:                 The perianal and digital rectal examinations were                            normal.                           Four sessile polyps were found in the descending                            colon (2) and ascending colon (2). The polyps were                            7 to 8 mm in size. These polyps were removed with a                            cold snare. Resection and retrieval were complete.                           A few small-mouthed diverticula were found in the  left colon. There was no evidence of diverticular                            bleeding.                           Internal hemorrhoids were found during                            retroflexion. The hemorrhoids were small and Grade                            I (internal hemorrhoids that do not prolapse).                           The exam was otherwise without abnormality on                            direct and retroflexion views. Complications:            No immediate complications. Estimated blood loss:                            None. Estimated Blood Loss:     Estimated blood loss: none. Impression:               - Four 7 to 8 mm polyps in the descending colon and                            in the ascending colon, removed with a cold snare.                            Resected and retrieved.                           - Mild  diverticulosis in the left colon. There was                            no evidence of diverticular bleeding.                           - Internal hemorrhoids. Recommendation:           - Repeat colonoscopy in 3 - 5 years for                            surveillance pending pathology review.                           - Patient has a contact number available for                            emergencies. The signs and symptoms of potential                            delayed complications were discussed with the  patient. Return to normal activities tomorrow.                            Written discharge instructions were provided to the                            patient.                           - High fiber diet.                           - Continue present medications.                           - Await pathology results. Ladene Artist, MD 10/26/2017 8:34:01 AM This report has been signed electronically.

## 2017-10-26 NOTE — Progress Notes (Signed)
A and O x3. Report to RN. Tolerated MAC anesthesia well.

## 2017-10-26 NOTE — Progress Notes (Signed)
Pt's states no medical or surgical changes since previsit or office visit.  Pt reported he took his metformin within the past week, but not sure of the day.  He said last time his blood sugar was taken was when he last saw his primary care md.  He reports he is not taking any other meds.  maw

## 2017-10-27 ENCOUNTER — Telehealth: Payer: Self-pay

## 2017-10-27 NOTE — Telephone Encounter (Signed)
Post procedure follow up call, left voicemail.

## 2017-10-27 NOTE — Telephone Encounter (Signed)
  Follow up Call-  Call [facility] number 10/26/2017  Post procedure Call [facility] [place] phone  # [phone] cell  Permission to leave phone message Yes  Some recent data might be hidden     Patient questions:  Do you have a fever, pain , or abdominal swelling? No. Pain Score  0 *  Have you tolerated food without any problems? Yes.    Have you been able to return to your normal activities? Yes.    Do you have any questions about your discharge instructions: Diet   No. Medications  No. Follow up visit  No.  Do you have questions or concerns about your Care? No.  Actions: * If pain score is 4 or above: No action needed, pain <4.

## 2017-11-01 ENCOUNTER — Encounter: Payer: BC Managed Care – PPO | Admitting: Gastroenterology

## 2017-11-03 ENCOUNTER — Encounter: Payer: Self-pay | Admitting: Gastroenterology

## 2017-11-15 DIAGNOSIS — M1711 Unilateral primary osteoarthritis, right knee: Secondary | ICD-10-CM | POA: Diagnosis not present

## 2017-12-07 ENCOUNTER — Encounter: Payer: Self-pay | Admitting: Pulmonary Disease

## 2017-12-07 ENCOUNTER — Ambulatory Visit (INDEPENDENT_AMBULATORY_CARE_PROVIDER_SITE_OTHER): Payer: BC Managed Care – PPO | Admitting: Pulmonary Disease

## 2017-12-07 VITALS — BP 130/74 | HR 93 | Ht 68.0 in | Wt 268.8 lb

## 2017-12-07 DIAGNOSIS — R0683 Snoring: Secondary | ICD-10-CM | POA: Diagnosis not present

## 2017-12-07 DIAGNOSIS — Z6841 Body Mass Index (BMI) 40.0 and over, adult: Secondary | ICD-10-CM | POA: Diagnosis not present

## 2017-12-07 NOTE — Patient Instructions (Signed)
Will arrange for in lab sleep study Will call to arrange for follow up after sleep study reviewed  

## 2017-12-07 NOTE — Progress Notes (Signed)
[facility]  Chief Complaint  Patient presents with  . sleep consult    referred for Snoring by Dr. Carollee Herter. Epworth score: 17    Constitutional: BP 130/74 (BP Location: Left Arm, Cuff Size: Normal)   Pulse 93   Ht 5\' 8"  (1.727 m)   Wt 268 lb 12.8 oz (121.9 kg)   SpO2 97%   BMI 40.87 kg/m   History of Present Illness: Calvin Chandler is a 72 y.o. male with snoring.  He was seen by PCP in June 2019.  His wife has been concerned about his snoring and says he stops breathing while asleep.  He has history of anxiety, depression, and diabetes.  He was advised to have further sleep evaluation.  This has been going on for a least a couple of years.  He has trouble sleeping on his back, and feels his throat closes when laying flat.  He wakes up feeling choked at times.  He is a restless sleeper.  His mouth gets dry at night.  He doesn't dream much.  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   He goes to sleep at 11 pm.  He falls asleep in minutes.  He puts lavender oil on his pillow.  He wakes up 2 times to use the bathroom.  He gets out of bed at 5 am.  He feels tired in the morning.  He does get morning headaches.  He does not use anything to help him fall sleep or stay awake.  He denies sleep walking, sleep talking, bruxism, or nightmares.  There is no history of restless legs.  He denies sleep hallucinations, sleep paralysis, or cataplexy.  The Epworth score is 17 out of 24.   Comprehensive Respiratory Exam:  Appearance - well kempt  ENMT - nasal mucosa moist, turbinates clear, midline nasal septum, poor dentition, no gingival bleeding, no oral exudates, no tonsillar hypertrophy Neck - no masses, trachea midline, no thyromegaly, no elevation in JVP Respiratory - normal appearance of chest wall, normal respiratory effort w/o accessory muscle use, no dullness on percussion, no wheezing or rales CV - s1s2 regular rate and rhythm, no  murmurs, no peripheral edema, radial pulses symmetric GI - soft, non tender, no masses, no hepatosplenomegaly Lymph - no adenopathy noted in neck and axillary areas MSK - normal muscle strength and tone, normal gait Ext - no cyanosis, clubbing, or joint inflammation noted Skin - no rashes, lesions, or ulcers Neuro - oriented to person, place, and time Psych - normal mood and affect  Discussion: He has snoring, sleep disruption, witnessed apnea, and daytime sleepiness.  He has history of mood disorder and diabetes.  His BMI is > 35.  He is a Proofreader.  I am concerned he could have sleep apnea.  Assessment/Plan:  Snoring with excessive daytime sleepiness. - will need to arrange for an in lab sleep study as he has state employee health plan  Obesity. - discussed how weight can impact sleep and risk for sleep disordered breathing - discussed options to assist with weight loss: combination of diet modification, cardiovascular and strength training exercises  Cardiovascular risk. - had an extensive discussion regarding the adverse health consequences related to untreated sleep disordered breathing - specifically discussed the risks for hypertension, coronary artery disease, cardiac dysrhythmias, cerebrovascular disease, and diabetes - lifestyle modification discussed  Safe driving practices. - discussed how sleep disruption can increase risk of accidents, particularly when driving -  safe driving practices were discussed  Therapies for obstructive sleep apnea. - if the sleep study shows significant sleep apnea, then various therapies for treatment were reviewed: CPAP, oral appliance, and surgical interventions    Patient Instructions  Will arrange for in lab sleep study Will call to arrange for follow up after sleep study reviewed     Chesley Mires, MD [place] 12/07/2017, 10:16 AM  Flow Sheet  Sleep tests:  Review of Systems: Reviewed and  negative except in HPI  Past Medical History: He  has a past medical history of Anxiety, Arthritis, Depression, Diabetes mellitus without complication ([place]), and Hyperlipidemia.  Past Surgical History: He  has a past surgical history that includes No past surgeries; Wisdom tooth extraction; and Colonoscopy.  Family History: His family history includes Cancer in his sister; Clotting disorder in his unknown relative; Heart disease in his mother; Hypertension in his unknown relative; Prostate cancer in his father; Pulmonary embolism in his father; Stroke in his unknown relative; Transient ischemic attack in his sister.  Social History: He  reports that he quit smoking about 36 years ago. His smoking use included cigarettes. He has a 39.00 pack-year smoking history. He has never used smokeless tobacco. He reports that he drinks alcohol. He reports that he does not use drugs.  Medications: Allergies as of 12/07/2017   No Known Allergies     Medication List        Accurate as of 12/07/17 10:16 AM. Always use your most recent med list.          glucose blood test strip Check blood sugars daily or as needed.  DX:E11.51   ibuprofen 400 MG tablet Commonly known as:  ADVIL,MOTRIN Take 400 mg by mouth every 6 (six) hours as needed.   metFORMIN 1000 MG tablet Commonly known as:  GLUCOPHAGE Take 1,000 mg by mouth daily with breakfast.

## 2017-12-27 DIAGNOSIS — M1711 Unilateral primary osteoarthritis, right knee: Secondary | ICD-10-CM | POA: Diagnosis not present

## 2018-01-02 ENCOUNTER — Ambulatory Visit (HOSPITAL_BASED_OUTPATIENT_CLINIC_OR_DEPARTMENT_OTHER): Payer: BC Managed Care – PPO | Attending: Pulmonary Disease | Admitting: Pulmonary Disease

## 2018-01-02 DIAGNOSIS — G4733 Obstructive sleep apnea (adult) (pediatric): Secondary | ICD-10-CM | POA: Insufficient documentation

## 2018-01-02 DIAGNOSIS — R0683 Snoring: Secondary | ICD-10-CM

## 2018-01-03 DIAGNOSIS — M1711 Unilateral primary osteoarthritis, right knee: Secondary | ICD-10-CM | POA: Diagnosis not present

## 2018-01-09 DIAGNOSIS — M1711 Unilateral primary osteoarthritis, right knee: Secondary | ICD-10-CM | POA: Diagnosis not present

## 2018-01-11 ENCOUNTER — Telehealth: Payer: Self-pay | Admitting: Pulmonary Disease

## 2018-01-11 ENCOUNTER — Encounter: Payer: Self-pay | Admitting: Pulmonary Disease

## 2018-01-11 DIAGNOSIS — G4733 Obstructive sleep apnea (adult) (pediatric): Secondary | ICD-10-CM

## 2018-01-11 DIAGNOSIS — R0683 Snoring: Secondary | ICD-10-CM

## 2018-01-11 HISTORY — DX: Obstructive sleep apnea (adult) (pediatric): G47.33

## 2018-01-11 NOTE — Telephone Encounter (Signed)
PSG 01/02/18 >> AHI 24.6, SpO2 low 85%.   Please let him know his sleep study showed moderate sleep apnea.  Please schedule ROV with me or NP to discuss treatment options.

## 2018-01-11 NOTE — Procedures (Signed)
    Patient Name: Calvin Chandler, Calvin Chandler Date: 01/02/2018 Gender: Male D.O.B: Apr 18, 1945 Age (years): 13 Referring Provider: Chesley Mires MD, ABSM Height (inches): 70 Interpreting Physician: Chesley Mires MD, ABSM Weight (lbs): 258 RPSGT: Earney [place] BMI: 38 MRN: [id number] Neck Size: 17.50 <br> <br>  CLINICAL INFORMATION  Sleep Study Type: NPSG  Indication for sleep study: Snoring  Epworth Sleepiness Score: 13  SLEEP STUDY TECHNIQUE  As per the AASM Manual for the Scoring of Sleep and Associated Events v2.3 (April 2016) with a hypopnea requiring 4% desaturations. The channels recorded and monitored were frontal, central and occipital EEG, electrooculogram (EOG), submentalis EMG (chin), nasal and oral airflow, thoracic and abdominal wall motion, anterior tibialis EMG, snore microphone, electrocardiogram, and pulse oximetry. MEDICATIONS  Medications self-administered by patient taken the night of the study : N/A SLEEP ARCHITECTURE  The study was initiated at 10:05:51 PM and ended at 4:31:49 AM. Sleep onset time was 83.4 minutes and the sleep efficiency was 54.3%%. The total sleep time was 209.6 minutes. Stage REM latency was 187.0 minutes. The patient spent 6.7%% of the night in stage N1 sleep, 86.2%% in stage N2 sleep, 0.0%% in stage N3 and 7.2% in REM. Alpha intrusion was absent. Supine sleep was 1.43%. RESPIRATORY PARAMETERS  The overall apnea/hypopnea index (AHI) was 24.6 per hour. There were 77 total apneas, including 77 obstructive, 0 central and 0 mixed apneas. There were 9 hypopneas and 105 RERAs. The AHI during Stage REM sleep was 84.0 per hour. AHI while supine was 60.0 per hour. The mean oxygen saturation was 96.0%. The minimum SpO2 during sleep was 85.0%. loud snoring was noted during this study. CARDIAC DATA  The 2 lead EKG demonstrated sinus rhythm. The mean heart rate was 63.6 beats per minute. Other EKG findings include: None. LEG MOVEMENT DATA  The  total PLMS were 0 with a resulting PLMS index of 0.0. Associated arousal with leg movement index was 0.0 . IMPRESSIONS  - Moderate obstructive sleep apnea with an AHI of 24.6 and SpO2 low of 85%. DIAGNOSIS  - Obstructive Sleep Apnea (327.23 [G47.33 ICD-10]) RECOMMENDATIONS  - Additional therapies include weight loss, CPAP, oral appliance, or surgical assessment.  [Electronically signed] 01/11/2018 05:43 AM  Chesley Mires MD, ABSM Diplomate, American Board of Sleep Medicine  NPI: [id number]

## 2018-01-11 NOTE — Telephone Encounter (Signed)
Attempted to call patient today regarding results. I did not receive an answer at time of call. I have left a voicemail message for pt to return call. X1  

## 2018-01-12 NOTE — Telephone Encounter (Signed)
Patient returning call - he can be reached at [phone] -pr

## 2018-01-12 NOTE — Telephone Encounter (Signed)
Advised pt of results. Pt understood and nothing further is needed. Made an appt with VS 01/18/2018 at 10:45am,

## 2018-01-18 ENCOUNTER — Ambulatory Visit (INDEPENDENT_AMBULATORY_CARE_PROVIDER_SITE_OTHER): Payer: BC Managed Care – PPO | Admitting: Pulmonary Disease

## 2018-01-18 ENCOUNTER — Encounter: Payer: Self-pay | Admitting: Pulmonary Disease

## 2018-01-18 VITALS — BP 118/86 | HR 74 | Ht 69.0 in | Wt 264.0 lb

## 2018-01-18 DIAGNOSIS — E669 Obesity, unspecified: Secondary | ICD-10-CM

## 2018-01-18 DIAGNOSIS — Z7189 Other specified counseling: Secondary | ICD-10-CM

## 2018-01-18 DIAGNOSIS — G4733 Obstructive sleep apnea (adult) (pediatric): Secondary | ICD-10-CM | POA: Diagnosis not present

## 2018-01-18 DIAGNOSIS — G4730 Sleep apnea, unspecified: Secondary | ICD-10-CM | POA: Diagnosis not present

## 2018-01-18 NOTE — Progress Notes (Signed)
Pulmonary, Critical Care, and Sleep Medicine  Chief Complaint  Patient presents with  . Follow-up    Pt is here to go over HST results.    Constitutional:  BP 118/86 (BP Location: Left Arm, Cuff Size: Normal)   Pulse 74   Ht 5\' 9"  (1.753 m)   Wt 264 lb (119.7 kg)   SpO2 100%   BMI 38.99 kg/m   Past Medical History:  Anxiety, Depression, DM, HLD  Brief Summary:  Calvin Chandler is a 72 y.o. male with obstructive sleep apnea.  He had in lab sleep study.  Showed moderate sleep apnea.  He was given mask from sleep lab.  This was a little tight.  He hasn't really tried anything to get his weight down.  Discussed option of flu shot - he does not get flu shots  Physical Exam:   Appearance - well kempt  ENMT - clear nasal mucosa, midline nasal septum, no oral exudates, no LAN, trachea midline Respiratory - normal chest wall, normal respiratory effort, no accessory muscle use, no wheeze/rales CV - s1s2 regular rate and rhythm, no murmurs, no peripheral edema, radial pulses symmetric GI - soft, non tender, no masses Lymph - no adenopathy noted in neck and axillary areas MSK - normal muscle strength and tone, normal gait Ext - no cyanosis, clubbing, or joint inflammation noted Skin - no rashes, lesions, or ulcers Neuro - oriented to person, place, and time Psych - normal mood and affect  Assessment/Plan:   Obstructive sleep apnea. - reviewed his sleep study with him - discussed options to treat sleep apnea: CPAP, surgery, oral appliance, and weight loss - he would like to start with CPAP - will arrange for auto CPAP range 5 to 15 cm H2O - discussed options to assist with mask fit and acclimatize to using CPAP  Obesity. - reviewed options to assist with weight loss, including diet modification and exercise regimen   Patient Instructions  Will arrange for new auto CPAP set up  Follow up in 2 months after getting CPAP    Chesley Mires, MD [place] Pager: [phone] 01/18/2018, 11:16 AM  Flow Sheet    Sleep tests:  PSG 01/02/18 >> AHI 24.6, SpO2 low 85%  Medications:   Allergies as of 01/18/2018   No Known Allergies     Medication List        Accurate as of 01/18/18 11:16 AM. Always use your most recent med list.          glucose blood test strip Check blood sugars daily or as needed.  DX:E11.51   ibuprofen 400 MG tablet Commonly known as:  ADVIL,MOTRIN Take 400 mg by mouth every 6 (six) hours as needed.   metFORMIN 1000 MG tablet Commonly known as:  GLUCOPHAGE Take 1,000 mg by mouth daily with breakfast.       Past Surgical History:  He  has a past surgical history that includes No past surgeries; Wisdom tooth extraction; and Colonoscopy.  Family History:  His family history includes Cancer in his sister; Clotting disorder in his unknown relative; Heart disease in his mother; Hypertension in his unknown relative; Prostate cancer in his father; Pulmonary embolism in his father; Stroke in his unknown relative; Transient ischemic attack in his sister.  Social History:  He  reports that he quit smoking about 36 years ago. His smoking use included cigarettes. He has a 39.00 pack-year smoking history. He has never used smokeless tobacco. He reports that  he drinks alcohol. He reports that he does not use drugs.

## 2018-01-18 NOTE — Patient Instructions (Signed)
Will arrange for new auto CPAP set up  Follow up in 2 months after getting CPAP

## 2018-07-04 ENCOUNTER — Telehealth: Payer: Self-pay

## 2018-07-04 NOTE — Telephone Encounter (Signed)
Copied from [place] [id number]. Topic: General - Other >> Jul 04, 2018  1:18 PM Rayann Heman wrote: Reason for CRM:pt called and stated that he needs to take a leave of absence due to the covid-19. Pt states that he needs a note faxed over to his work stating that he is high risk to get covid-19 because he is diabetic. Please advise   Fax# [phone]

## 2018-07-05 NOTE — Telephone Encounter (Signed)
Are you ok with this? 

## 2018-07-05 NOTE — Telephone Encounter (Signed)
Ok to write note for 2 weeks

## 2018-07-06 NOTE — Telephone Encounter (Signed)
Pt will be by the office to pick this up. Something is going on with the fax machine at his work

## 2018-07-06 NOTE — Telephone Encounter (Signed)
Letter written and patient picked up.

## 2019-05-06 ENCOUNTER — Encounter: Payer: Self-pay | Admitting: Family Medicine

## 2019-09-26 ENCOUNTER — Ambulatory Visit (HOSPITAL_COMMUNITY)
Admission: EM | Admit: 2019-09-26 | Discharge: 2019-09-26 | Disposition: A | Discharge status: Home / Self Care | Payer: No Typology Code available for payment source | Attending: Urgent Care | Admitting: Urgent Care

## 2019-09-26 ENCOUNTER — Encounter (HOSPITAL_COMMUNITY): Payer: Self-pay

## 2019-09-26 ENCOUNTER — Other Ambulatory Visit: Payer: Self-pay

## 2019-09-26 ENCOUNTER — Ambulatory Visit (INDEPENDENT_AMBULATORY_CARE_PROVIDER_SITE_OTHER): Payer: No Typology Code available for payment source

## 2019-09-26 DIAGNOSIS — W19XXXA Unspecified fall, initial encounter: Secondary | ICD-10-CM

## 2019-09-26 DIAGNOSIS — M25521 Pain in right elbow: Secondary | ICD-10-CM

## 2019-09-26 DIAGNOSIS — M25571 Pain in right ankle and joints of right foot: Secondary | ICD-10-CM

## 2019-09-26 DIAGNOSIS — M25511 Pain in right shoulder: Secondary | ICD-10-CM

## 2019-09-26 DIAGNOSIS — S5001XA Contusion of right elbow, initial encounter: Secondary | ICD-10-CM

## 2019-09-26 DIAGNOSIS — S40011A Contusion of right shoulder, initial encounter: Secondary | ICD-10-CM

## 2019-09-26 MED ORDER — TRAMADOL HCL 50 MG PO TABS: 50.0000 mg | ORAL_TABLET | Freq: Four times a day (QID) | ORAL | 0 refills | Status: DC | PRN

## 2019-09-26 MED ORDER — ACETAMINOPHEN 325 MG PO TABS
650.0000 mg | ORAL_TABLET | Freq: Once | ORAL | Status: AC
  Administered 2019-09-26: 650 mg via ORAL

## 2019-09-26 MED ORDER — ACETAMINOPHEN 325 MG PO TABS
ORAL_TABLET | ORAL | Status: AC
  Filled 2019-09-26: qty 2

## 2019-09-26 NOTE — Discharge Instructions (Addendum)
Please schedule Tylenol at 500 mg - 650 mg once every 6 hours as needed for aches and pains.  If you still have pain despite taking Tylenol regularly, this is breakthrough pain.  You can use tramadol once every 6 hours for this.  Once your pain is better controlled, switch back to just Tylenol.  

## 2019-09-26 NOTE — ED Triage Notes (Signed)
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 Pt states he fell onto his right side hitting his elbow, and shoulder. Pt is also c/o right ankle pain. Pt has limited range of motion in shoulder and elbow. Pt right radial pulse +2, and brisk cap refill in RUE. Pt denies any meds prior to arrival. Pt currently icing right shoulder.

## 2019-09-26 NOTE — ED Provider Notes (Signed)
[facility]   MRN: [id number] DOB: 02-13-1946  Subjective:   Calvin Chandler is a 74 y.o. male presenting for suffering an accidental fall today while at work.  Patient states that he was trying to help coworker get onto a bus.  He slipped on wet grass and fell backward making impact with his right elbow radiating into his right shoulder.  He has since developed some right ankle pain.  He has severe pain, states that he does not want to use his shoulder or elbow, arm at all.  He is able to bear weight but does not want to, prefers to be in a wheelchair.  Has not taken anything for pain.  Denies loss of consciousness, confusion, headache, dizziness.   Current Facility-Administered Medications:    0.9 %  sodium chloride infusion, 500 mL, Intravenous, Once, Ladene Artist, MD  Current Outpatient Medications:    glucose blood (ONETOUCH VERIO) test strip, Check blood sugars daily or as needed.  DX:E11.51, Disp: 100 each, Rfl: 12   ibuprofen (ADVIL,MOTRIN) 400 MG tablet, Take 400 mg by mouth every 6 (six) hours as needed., Disp: , Rfl:    metFORMIN (GLUCOPHAGE) 1000 MG tablet, Take 1,000 mg by mouth daily with breakfast. , Disp: , Rfl:    No Known Allergies  Past Medical History:  Diagnosis Date   Anxiety    Arthritis    Depression    Diabetes mellitus without complication ([place])    Hyperlipidemia    OSA (obstructive sleep apnea) 01/11/2018     Past Surgical History:  Procedure Laterality Date   COLONOSCOPY     NO PAST SURGERIES     WISDOM TOOTH EXTRACTION      Family History  Problem Relation Age of Onset   Prostate cancer Father    Pulmonary embolism Father    Transient ischemic attack Sister    Cancer Sister        lymph node cancer   Heart disease Mother    Hypertension Other    Stroke Other    Clotting disorder Other        Thromboembolism clotting disorder   Colon cancer Neg Hx    Stomach cancer Neg Hx    Rectal cancer Neg Hx     Pancreatic cancer Neg Hx    Esophageal cancer Neg Hx    Liver cancer Neg Hx     Social History   Tobacco Use   Smoking status: Former Smoker    Packs/day: 3.00    Years: 13.00    Pack years: 39.00    Types: Cigarettes    Quit date: 03/03/1981    Years since quitting: 38.5   Smokeless tobacco: Never Used  Vaping Use   Vaping Use: Never used  Substance Use Topics   Alcohol use: Yes    Comment: 6 oz   Drug use: No    ROS   Objective:   Vitals: BP 129/75 (BP Location: Right Arm)    Pulse 83    Temp 97.7 F (36.5 C) (Oral)    Resp 18    SpO2 99%   Physical Exam Constitutional:      General: He is not in acute distress.    Appearance: Normal appearance. He is well-developed. He is obese. He is not ill-appearing, toxic-appearing or diaphoretic.  HENT:     Head: Normocephalic and atraumatic.     Right Ear: External ear normal.     Left Ear: External ear normal.  Nose: Nose normal.     Mouth/Throat:     Pharynx: Oropharynx is clear.  Eyes:     General: No scleral icterus.       Right eye: No discharge.        Left eye: No discharge.     Extraocular Movements: Extraocular movements intact.     Pupils: Pupils are equal, round, and reactive to light.  Cardiovascular:     Rate and Rhythm: Normal rate.  Pulmonary:     Effort: Pulmonary effort is normal.  Musculoskeletal:     Right shoulder: Tenderness and bony tenderness present. No swelling, deformity, effusion, laceration or crepitus. Decreased range of motion (Full passive range of motion, can hold at 90 degrees abduction but is unwilling to actively move his shoulder into that position, internal and external rotation intact). Normal strength.     Right upper arm: No swelling, edema, deformity, lacerations, tenderness or bony tenderness.     Right elbow: Swelling (Trace without ecchymosis) present. No deformity or lacerations. Decreased range of motion. Tenderness present in radial head, medial epicondyle,  lateral epicondyle and olecranon process.     Right forearm: No swelling, edema, deformity, lacerations, tenderness or bony tenderness.     Right wrist: No swelling, deformity, effusion, lacerations, tenderness, bony tenderness, snuff box tenderness or crepitus. Normal range of motion.     Right hand: No swelling, deformity, lacerations, tenderness or bony tenderness. Normal range of motion. Normal strength. Normal sensation. Normal capillary refill.     Cervical back: Normal range of motion.  Neurological:     Mental Status: He is alert and oriented to person, place, and time.  Psychiatric:        Mood and Affect: Mood normal.        Behavior: Behavior normal.        Thought Content: Thought content normal.        Judgment: Judgment normal.     DG Shoulder Right  Result Date: 09/26/2019 CLINICAL DATA:  Recent fall with right shoulder pain, initial encounter EXAM: RIGHT SHOULDER - 2+ VIEW COMPARISON:  None. FINDINGS: Degenerative changes of the acromioclavicular joint noted. No acute fracture or dislocation is seen. Inferior spurring at the acromion is noted which may cause some impingement symptoms. No acute soft tissue abnormality is seen. IMPRESSION: Degenerative change without acute abnormality. Electronically Signed   By: Inez Catalina M.D.   On: 09/26/2019 13:34   DG Elbow Complete Right  Result Date: 09/26/2019 CLINICAL DATA:  Recent fall with elbow pain, initial encounter EXAM: RIGHT ELBOW - COMPLETE 3+ VIEW COMPARISON:  None. FINDINGS: There is no evidence of fracture, dislocation, or joint effusion. There is no evidence of arthropathy or other focal bone abnormality. Soft tissues are unremarkable. IMPRESSION: No acute abnormality noted. Electronically Signed   By: Inez Catalina M.D.   On: 09/26/2019 13:33    Assessment and Plan :   PDMP not reviewed this encounter.  1. Acute pain of right shoulder   2. Right elbow pain   3. Fall, initial encounter   4. Acute right ankle pain     5. Contusion of right elbow, initial encounter   6. Contusion of right shoulder, initial encounter     Encourage patient to use Tylenol, tramadol for breakthrough pain.  Anticipatory guidance provided. Counseled patient on potential for adverse effects with medications prescribed/recommended today, ER and return-to-clinic precautions discussed, patient verbalized understanding.    Jaynee Eagles, PA-C 09/26/19 1437

## 2019-09-26 NOTE — ED Notes (Signed)
Spoke to patient in lobby.  Patient has pain in right shoulder and upper back.  Patient had a fall working on school bus.  Reports landing directly on right shoulder.  Patient has a regular and strong radial pulse on right.  Able to move fingers of this arm, brisk cap refill.  Patient arrived with ice pack to shoulder and back.

## 2019-11-26 ENCOUNTER — Telehealth: Payer: Self-pay | Admitting: Family Medicine

## 2019-11-26 NOTE — Telephone Encounter (Signed)
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 Patient is wondering if a lab order could be placed.

## 2019-11-26 NOTE — Telephone Encounter (Signed)
Pt has not been seen since 2019 and has not had labs since 2018. Pt needs an OV.

## 2019-11-29 ENCOUNTER — Ambulatory Visit (INDEPENDENT_AMBULATORY_CARE_PROVIDER_SITE_OTHER): Payer: BC Managed Care – PPO | Admitting: Family Medicine

## 2019-11-29 ENCOUNTER — Other Ambulatory Visit: Payer: Self-pay

## 2019-11-29 ENCOUNTER — Encounter: Payer: Self-pay | Admitting: Family Medicine

## 2019-11-29 VITALS — BP 124/80 | HR 96 | Temp 98.1°F | Resp 18 | Ht 69.0 in | Wt 246.6 lb

## 2019-11-29 DIAGNOSIS — E785 Hyperlipidemia, unspecified: Secondary | ICD-10-CM | POA: Diagnosis not present

## 2019-11-29 DIAGNOSIS — E1165 Type 2 diabetes mellitus with hyperglycemia: Secondary | ICD-10-CM | POA: Diagnosis not present

## 2019-11-29 DIAGNOSIS — M25511 Pain in right shoulder: Secondary | ICD-10-CM

## 2019-11-29 DIAGNOSIS — E1151 Type 2 diabetes mellitus with diabetic peripheral angiopathy without gangrene: Secondary | ICD-10-CM

## 2019-11-29 DIAGNOSIS — G8929 Other chronic pain: Secondary | ICD-10-CM

## 2019-11-29 DIAGNOSIS — E1169 Type 2 diabetes mellitus with other specified complication: Secondary | ICD-10-CM | POA: Diagnosis not present

## 2019-11-29 HISTORY — DX: Other chronic pain: G89.29

## 2019-11-29 LAB — MICROALBUMIN / CREATININE URINE RATIO
Creatinine,U: 139.4 mg/dL
Microalb Creat Ratio: 0.6 mg/g (ref 0.0–30.0)
Microalb, Ur: 0.9 mg/dL (ref 0.0–1.9)

## 2019-11-29 LAB — COMPREHENSIVE METABOLIC PANEL
ALT: 17 U/L (ref 0–53)
AST: 15 U/L (ref 0–37)
Albumin: 4 g/dL (ref 3.5–5.2)
Alkaline Phosphatase: 82 U/L (ref 39–117)
BUN: 12 mg/dL (ref 6–23)
CO2: 29 mEq/L (ref 19–32)
Calcium: 9.4 mg/dL (ref 8.4–10.5)
Chloride: 99 mEq/L (ref 96–112)
Creatinine, Ser: 1.08 mg/dL (ref 0.40–1.50)
GFR: 81.28 mL/min (ref >60.00)
Glucose, Bld: 275 mg/dL — ABNORMAL HIGH (ref 70–99)
Potassium: 4.5 mEq/L (ref 3.5–5.1)
Sodium: 135 mEq/L (ref 135–145)
Total Bilirubin: 1 mg/dL (ref 0.2–1.2)
Total Protein: 7.1 g/dL (ref 6.0–8.3)

## 2019-11-29 LAB — LIPID PANEL
Cholesterol: 198 mg/dL (ref 0–200)
HDL: 54.1 mg/dL (ref >39.00)
LDL Cholesterol: 124 mg/dL — ABNORMAL HIGH (ref 0–99)
NonHDL: 143.54
Total CHOL/HDL Ratio: 4
Triglycerides: 99 mg/dL (ref 0.0–149.0)
VLDL: 19.8 mg/dL (ref 0.0–40.0)

## 2019-11-29 LAB — HEMOGLOBIN A1C: Hgb A1c MFr Bld: 12.8 % — ABNORMAL HIGH (ref 4.6–6.5)

## 2019-11-29 LAB — VITAMIN D 25 HYDROXY (VIT D DEFICIENCY, FRACTURES): VITD: 19.19 ng/mL — ABNORMAL LOW (ref 30.00–100.00)

## 2019-11-29 NOTE — Progress Notes (Signed)
Patient ID: Calvin Chandler, male    DOB: Feb 12, 1946  Age: 74 y.o. MRN: [id number]    Subjective:  Subjective  HPI NIVEK POWLEY presents for dm, chol ---     DIABETES    Blood Sugar ranges-not checking   Polyuria- no New Visual problems- no  Hypoglycemic symptoms- no  Other side effects-no Medication compliance - poor---due to side effects  Last eye exam- jan 2021 Foot exam- today   HYPERLIPIDEMIA  Medication compliance- na RUQ pain- no  Muscle aches- no Other side effects-no     Review of Systems  Constitutional: Negative for chills and fever.  HENT: Negative for congestion and hearing loss.   Eyes: Negative for discharge.  Respiratory: Negative for cough and shortness of breath.   Cardiovascular: Negative for chest pain, palpitations and leg swelling.  Gastrointestinal: Negative for abdominal pain, blood in stool, constipation, diarrhea, nausea and vomiting.  Genitourinary: Negative for dysuria, frequency, hematuria and urgency.  Musculoskeletal: Negative for back pain and myalgias.  Skin: Negative for rash.  Allergic/Immunologic: Negative for environmental allergies.  Neurological: Negative for dizziness, weakness and headaches.  Hematological: Does not bruise/bleed easily.  Psychiatric/Behavioral: Negative for suicidal ideas. The patient is not nervous/anxious.     History Past Medical History:  Diagnosis Date  . Anxiety   . Arthritis   . Depression   . Diabetes mellitus without complication ([place])   . Hyperlipidemia   . OSA (obstructive sleep apnea) 01/11/2018    He has a past surgical history that includes No past surgeries; Wisdom tooth extraction; and Colonoscopy.   His family history includes Cancer in his sister; Clotting disorder in an other family member; Heart disease in his mother; Hypertension in an other family member; Prostate cancer in his father; Pulmonary embolism in his father; Stroke in an other family member; Transient ischemic attack in  his sister.He reports that he quit smoking about 38 years ago. His smoking use included cigarettes. He has a 39.00 pack-year smoking history. He has never used smokeless tobacco. He reports current alcohol use. He reports that he does not use drugs.  Current Outpatient Medications on File Prior to Visit  Medication Sig Dispense Refill  . glucose blood (ONETOUCH VERIO) test strip Check blood sugars daily or as needed.  DX:E11.51 100 each 12  . ibuprofen (ADVIL,MOTRIN) 400 MG tablet Take 400 mg by mouth every 6 (six) hours as needed.    . metFORMIN (GLUCOPHAGE) 1000 MG tablet Take 1,000 mg by mouth daily with breakfast.     . traMADol (ULTRAM) 50 MG tablet Take 1 tablet (50 mg total) by mouth every 6 (six) hours as needed. 15 tablet 0   Current Facility-Administered Medications on File Prior to Visit  Medication Dose Route Frequency Provider Last Rate Last Admin  . 0.9 %  sodium chloride infusion  500 mL Intravenous Once Ladene Artist, MD         Objective:  Objective  Physical Exam Vitals and nursing note reviewed.  Constitutional:      General: He is sleeping.     Appearance: He is well-developed.  HENT:     Head: Normocephalic and atraumatic.  Eyes:     Pupils: Pupils are equal, round, and reactive to light.  Neck:     Thyroid: No thyromegaly.  Cardiovascular:     Rate and Rhythm: Normal rate and regular rhythm.     Heart sounds: No murmur heard.   Pulmonary:     Effort: Pulmonary effort is  normal. No respiratory distress.     Breath sounds: Normal breath sounds. No wheezing or rales.  Chest:     Chest wall: No tenderness.  Musculoskeletal:        General: No tenderness.     Cervical back: Normal range of motion and neck supple.  Skin:    General: Skin is warm and dry.  Neurological:     Mental Status: He is oriented to person, place, and time.  Psychiatric:        Behavior: Behavior normal.        Thought Content: Thought content normal.        Judgment: Judgment  normal.    Diabetic Foot Exam - Simple   Simple Foot Form Diabetic Foot exam was performed with the following findings: Yes 11/29/2019 12:53 PM  Visual Inspection No deformities, no ulcerations, no other skin breakdown bilaterally: Yes Sensation Testing Intact to touch and monofilament testing bilaterally: Yes Pulse Check Posterior Tibialis and Dorsalis pulse intact bilaterally: Yes Comments     BP 124/80 (BP Location: Right Arm, Patient Position: Sitting, Cuff Size: Large)   Pulse 96   Temp 98.1 F (36.7 C) (Oral)   Resp 18   Ht 5\' 9"  (1.753 m)   Wt 246 lb 9.6 oz (111.9 kg)   SpO2 98%   BMI 36.42 kg/m  Wt Readings from Last 3 Encounters:  11/29/19 246 lb 9.6 oz (111.9 kg)  01/18/18 264 lb (119.7 kg)  01/02/18 258 lb (117 kg)     Lab Results  Component Value Date   WBC 3.5 (L) 01/27/2015   HGB 14.4 01/27/2015   HCT 43.7 01/27/2015   PLT 234.0 01/27/2015   GLUCOSE 125 (H) 03/07/2017   CHOL 172 03/07/2017   TRIG 122.0 03/07/2017   HDL 47.20 03/07/2017   LDLDIRECT 114.4 08/24/2006   LDLCALC 101 (H) 03/07/2017   ALT 14 03/07/2017   AST 14 03/07/2017   NA 137 03/07/2017   K 4.1 03/07/2017   CL 104 03/07/2017   CREATININE 0.93 03/07/2017   BUN 13 03/07/2017   CO2 26 03/07/2017   TSH 0.78 05/05/2009   PSA 1.72 01/27/2015   HGBA1C 7.8 (H) 03/07/2017   MICROALBUR <0.7 03/07/2017    DG Shoulder Right  Result Date: 09/26/2019 CLINICAL DATA:  Recent fall with right shoulder pain, initial encounter EXAM: RIGHT SHOULDER - 2+ VIEW COMPARISON:  None. FINDINGS: Degenerative changes of the acromioclavicular joint noted. No acute fracture or dislocation is seen. Inferior spurring at the acromion is noted which may cause some impingement symptoms. No acute soft tissue abnormality is seen. IMPRESSION: Degenerative change without acute abnormality. Electronically Signed   By: Inez Catalina M.D.   On: 09/26/2019 13:34   DG Elbow Complete Right  Result Date: 09/26/2019 CLINICAL  DATA:  Recent fall with elbow pain, initial encounter EXAM: RIGHT ELBOW - COMPLETE 3+ VIEW COMPARISON:  None. FINDINGS: There is no evidence of fracture, dislocation, or joint effusion. There is no evidence of arthropathy or other focal bone abnormality. Soft tissues are unremarkable. IMPRESSION: No acute abnormality noted. Electronically Signed   By: Inez Catalina M.D.   On: 09/26/2019 13:33     Assessment & Plan:  Plan  I am having Vinnie Level maintain his glucose blood, metFORMIN, ibuprofen, and traMADol. We will continue to administer sodium chloride.  No orders of the defined types were placed in this encounter.   Problem List Items Addressed This Visit      Unprioritized  Chronic right shoulder pain   DM (diabetes mellitus) type II, controlled, with peripheral vascular disorder ([place])    hgba1c to be checked ,minimize simple carbs. Increase exercise as tolerated. Continue current meds Pt is not taking meds due to diarrhea from metformin      Hyperlipidemia LDL goal <70    Encouraged heart healthy diet, increase exercise, avoid trans fats, consider a krill oil cap daily       Other Visit Diagnoses    Uncontrolled type 2 diabetes mellitus with hyperglycemia ([place])    -  Primary   Relevant Orders   Hemoglobin A1c   Comprehensive metabolic panel   Microalbumin / creatinine urine ratio   Insulin, random   Vitamin D (25 hydroxy)   Hyperlipidemia associated with type 2 diabetes mellitus ([place])       Relevant Orders   Lipid panel   Comprehensive metabolic panel   Morbid obesity ([place])       Relevant Orders   Amb Ref to Medical Weight Management      Follow-up: Return in about 6 months (around 05/31/2020), or if symptoms worsen or fail to improve, for hyperlipidemia, diabetes II, annual exam, fasting.  Ann Held, DO

## 2019-11-29 NOTE — Patient Instructions (Signed)
Carbohydrate Counting for Diabetes Mellitus, Adult  Carbohydrate counting is a method of keeping track of how many carbohydrates you eat. Eating carbohydrates naturally increases the amount of sugar (glucose) in the blood. Counting how many carbohydrates you eat helps keep your blood glucose within normal limits, which helps you manage your diabetes (diabetes mellitus). It is important to know how many carbohydrates you can safely have in each meal. This is different for every person. A diet and nutrition specialist (registered dietitian) can help you make a meal plan and calculate how many carbohydrates you should have at each meal and snack. Carbohydrates are found in the following foods:  Grains, such as breads and cereals.  Dried beans and soy products.  Starchy vegetables, such as potatoes, peas, and corn.  Fruit and fruit juices.  Milk and yogurt.  Sweets and snack foods, such as cake, cookies, candy, chips, and soft drinks. How do I count carbohydrates? There are two ways to count carbohydrates in food. You can use either of the methods or a combination of both. Reading "Nutrition Facts" on packaged food The "Nutrition Facts" list is included on the labels of almost all packaged foods and beverages in the [place] It includes:  The serving size.  Information about nutrients in each serving, including the grams (g) of carbohydrate per serving. To use the "Nutrition Facts":  Decide how many servings you will have.  Multiply the number of servings by the number of carbohydrates per serving.  The resulting number is the total amount of carbohydrates that you will be having. Learning standard serving sizes of other foods When you eat carbohydrate foods that are not packaged or do not include "Nutrition Facts" on the label, you need to measure the servings in order to count the amount of carbohydrates:  Measure the foods that you will eat with a food scale or measuring cup, if  needed.  Decide how many standard-size servings you will eat.  Multiply the number of servings by 15. Most carbohydrate-rich foods have about 15 g of carbohydrates per serving. ? For example, if you eat 8 oz (170 g) of strawberries, you will have eaten 2 servings and 30 g of carbohydrates (2 servings x 15 g = 30 g).  For foods that have more than one food mixed, such as soups and casseroles, you must count the carbohydrates in each food that is included. The following list contains standard serving sizes of common carbohydrate-rich foods. Each of these servings has about 15 g of carbohydrates:   hamburger bun or  English muffin.   oz (15 mL) syrup.   oz (14 g) jelly.  1 slice of bread.  1 six-inch tortilla.  3 oz (85 g) cooked rice or pasta.  4 oz (113 g) cooked dried beans.  4 oz (113 g) starchy vegetable, such as peas, corn, or potatoes.  4 oz (113 g) hot cereal.  4 oz (113 g) mashed potatoes or  of a large baked potato.  4 oz (113 g) canned or frozen fruit.  4 oz (120 mL) fruit juice.  4-6 crackers.  6 chicken nuggets.  6 oz (170 g) unsweetened dry cereal.  6 oz (170 g) plain fat-free yogurt or yogurt sweetened with artificial sweeteners.  8 oz (240 mL) milk.  8 oz (170 g) fresh fruit or one small piece of fruit.  24 oz (680 g) popped popcorn. Example of carbohydrate counting Sample meal  3 oz (85 g) chicken breast.  6 oz (170 g)   brown rice.  4 oz (113 g) corn.  8 oz (240 mL) milk.  8 oz (170 g) strawberries with sugar-free whipped topping. Carbohydrate calculation 1. Identify the foods that contain carbohydrates: ? Rice. ? Corn. ? Milk. ? Strawberries. 2. Calculate how many servings you have of each food: ? 2 servings rice. ? 1 serving corn. ? 1 serving milk. ? 1 serving strawberries. 3. Multiply each number of servings by 15 g: ? 2 servings rice x 15 g = 30 g. ? 1 serving corn x 15 g = 15 g. ? 1 serving milk x 15 g = 15 g. ? 1  serving strawberries x 15 g = 15 g. 4. Add together all of the amounts to find the total grams of carbohydrates eaten: ? 30 g + 15 g + 15 g + 15 g = 75 g of carbohydrates total. Summary  Carbohydrate counting is a method of keeping track of how many carbohydrates you eat.  Eating carbohydrates naturally increases the amount of sugar (glucose) in the blood.  Counting how many carbohydrates you eat helps keep your blood glucose within normal limits, which helps you manage your diabetes.  A diet and nutrition specialist (registered dietitian) can help you make a meal plan and calculate how many carbohydrates you should have at each meal and snack. This information is not intended to replace advice given to you by your health care provider. Make sure you discuss any questions you have with your health care provider. Document Revised: 10/21/2016 Document Reviewed: 09/10/2015 Elsevier Patient Education  2020 [employer] Inc.  

## 2019-11-29 NOTE — Assessment & Plan Note (Signed)
hgba1c to be checked ,minimize simple carbs. Increase exercise as tolerated. Continue current meds Pt is not taking meds due to diarrhea from metformin

## 2019-11-29 NOTE — Assessment & Plan Note (Signed)
Encouraged heart healthy diet, increase exercise, avoid trans fats, consider a krill oil cap daily 

## 2019-11-30 ENCOUNTER — Other Ambulatory Visit: Payer: Self-pay | Admitting: Family Medicine

## 2019-11-30 DIAGNOSIS — E1165 Type 2 diabetes mellitus with hyperglycemia: Secondary | ICD-10-CM

## 2019-11-30 LAB — INSULIN, RANDOM: Insulin: 4 u[IU]/mL

## 2019-12-12 DIAGNOSIS — Z0289 Encounter for other administrative examinations: Secondary | ICD-10-CM

## 2019-12-20 ENCOUNTER — Ambulatory Visit (INDEPENDENT_AMBULATORY_CARE_PROVIDER_SITE_OTHER): Payer: Self-pay | Admitting: Family Medicine

## 2019-12-27 ENCOUNTER — Ambulatory Visit (INDEPENDENT_AMBULATORY_CARE_PROVIDER_SITE_OTHER): Payer: BC Managed Care – PPO | Admitting: Family Medicine

## 2019-12-27 ENCOUNTER — Encounter (INDEPENDENT_AMBULATORY_CARE_PROVIDER_SITE_OTHER): Payer: Self-pay | Admitting: Family Medicine

## 2019-12-27 ENCOUNTER — Other Ambulatory Visit: Payer: Self-pay

## 2019-12-27 VITALS — BP 131/86 | HR 82 | Temp 97.6°F | Ht 69.0 in | Wt 245.0 lb

## 2019-12-27 DIAGNOSIS — E785 Hyperlipidemia, unspecified: Secondary | ICD-10-CM | POA: Insufficient documentation

## 2019-12-27 DIAGNOSIS — Z87891 Personal history of nicotine dependence: Secondary | ICD-10-CM | POA: Diagnosis not present

## 2019-12-27 DIAGNOSIS — Z9189 Other specified personal risk factors, not elsewhere classified: Secondary | ICD-10-CM | POA: Diagnosis not present

## 2019-12-27 DIAGNOSIS — Z1331 Encounter for screening for depression: Secondary | ICD-10-CM | POA: Diagnosis not present

## 2019-12-27 DIAGNOSIS — R5383 Other fatigue: Secondary | ICD-10-CM | POA: Diagnosis not present

## 2019-12-27 DIAGNOSIS — E119 Type 2 diabetes mellitus without complications: Secondary | ICD-10-CM | POA: Insufficient documentation

## 2019-12-27 DIAGNOSIS — G4733 Obstructive sleep apnea (adult) (pediatric): Secondary | ICD-10-CM

## 2019-12-27 DIAGNOSIS — R0602 Shortness of breath: Secondary | ICD-10-CM

## 2019-12-27 DIAGNOSIS — E1169 Type 2 diabetes mellitus with other specified complication: Secondary | ICD-10-CM

## 2019-12-27 DIAGNOSIS — Z6836 Body mass index (BMI) 36.0-36.9, adult: Secondary | ICD-10-CM

## 2019-12-27 DIAGNOSIS — E559 Vitamin D deficiency, unspecified: Secondary | ICD-10-CM

## 2019-12-27 DIAGNOSIS — E66812 Morbid (severe) obesity due to excess calories: Secondary | ICD-10-CM

## 2019-12-27 HISTORY — DX: Type 2 diabetes mellitus without complications: E11.9

## 2019-12-27 HISTORY — DX: Type 2 diabetes mellitus with other specified complication: E11.69

## 2019-12-28 LAB — VITAMIN B12: Vitamin B-12: 441 pg/mL (ref 232–1245)

## 2019-12-28 LAB — T4, FREE: Free T4: 1.5 ng/dL (ref 0.82–1.77)

## 2019-12-28 LAB — T3: T3, Total: 83 ng/dL (ref 71–180)

## 2019-12-28 LAB — FOLATE: Folate: 13.1 ng/mL (ref >3.0)

## 2019-12-28 LAB — TSH: TSH: 1.11 u[IU]/mL (ref 0.450–4.500)

## 2020-01-01 ENCOUNTER — Other Ambulatory Visit: Payer: Self-pay

## 2020-01-01 ENCOUNTER — Ambulatory Visit (INDEPENDENT_AMBULATORY_CARE_PROVIDER_SITE_OTHER): Payer: BC Managed Care – PPO | Admitting: Internal Medicine

## 2020-01-01 ENCOUNTER — Encounter: Payer: Self-pay | Admitting: Internal Medicine

## 2020-01-01 VITALS — BP 124/84 | HR 84 | Ht 69.0 in | Wt 240.8 lb

## 2020-01-01 DIAGNOSIS — E1169 Type 2 diabetes mellitus with other specified complication: Secondary | ICD-10-CM

## 2020-01-01 DIAGNOSIS — E119 Type 2 diabetes mellitus without complications: Secondary | ICD-10-CM | POA: Insufficient documentation

## 2020-01-01 DIAGNOSIS — E785 Hyperlipidemia, unspecified: Secondary | ICD-10-CM | POA: Insufficient documentation

## 2020-01-01 LAB — POCT GLUCOSE (DEVICE FOR HOME USE): Glucose Fasting, POC: 353 mg/dL — AB (ref 70–99)

## 2020-01-01 MED ORDER — GLIPIZIDE 10 MG PO TABS: 10.0000 mg | ORAL_TABLET | Freq: Two times a day (BID) | ORAL | 1 refills | Status: DC

## 2020-01-01 MED ORDER — ATORVASTATIN CALCIUM 10 MG PO TABS: 10.0000 mg | ORAL_TABLET | Freq: Every day | ORAL | 3 refills | Status: DC

## 2020-01-01 MED ORDER — RYBELSUS 3 MG PO TABS: 3.0000 mg | ORAL_TABLET | Freq: Every day | ORAL | 3 refills | Status: DC

## 2020-01-01 MED ORDER — ACCU-CHEK GUIDE W/DEVICE KIT: 1.0000 | PACK | 0 refills | Status: DC

## 2020-01-01 MED ORDER — ACCU-CHEK GUIDE VI STRP: 1.0000 | ORAL_STRIP | 12 refills | Status: DC | PRN

## 2020-01-01 NOTE — Patient Instructions (Addendum)
-   Start Glipizide 10 mg, HALF a tablet before Breakfast and Supper for 1 week, then increase to 1 tablet before Breakfast and supper  - Start Rybelsus 3 mg daily with Breakfast   - Start Atorvastatin ( Lipitor) 10 mg , 1 tablet at night ( you can wait a month before starting this)   - HOW TO TREAT LOW BLOOD SUGARS (Blood sugar LESS THAN 70 MG/DL)  Please follow the RULE OF 15 for the treatment of hypoglycemia treatment (when your (blood sugars are less than 70 mg/dL)    STEP 1: Take 15 grams of carbohydrates when your blood sugar is low, which includes:   3-4 GLUCOSE TABS  OR  3-4 OZ OF JUICE OR REGULAR SODA OR  ONE TUBE OF GLUCOSE GEL     STEP 2: RECHECK blood sugar in 15 MINUTES STEP 3: If your blood sugar is still low at the 15 minute recheck --> then, go back to STEP 1 and treat AGAIN with another 15 grams of carbohydrates.

## 2020-01-01 NOTE — Progress Notes (Signed)
Name: Calvin Chandler  MRN/ DOB: [id number], 07-14-1945   Age/ Sex: 74 y.o., male    PCP: Carollee Herter, Alferd Apa, DO   Reason for Endocrinology Evaluation: Type 2 Diabetes Mellitus     Date of Initial Endocrinology Visit: 01/01/2020     PATIENT IDENTIFIER: Calvin Chandler is a 74 y.o. male with a past medical history of T2DM, OSA, dyslipidemia . The patient presented for initial endocrinology clinic visit on 01/01/2020 for consultative assistance with his diabetes management.    HPI: Calvin Chandler was    Diagnosed with DM in 2008 Prior Medications tried/Intolerance: Metformin - Diarrhea Currently checking blood sugars 0 x / day Hypoglycemia episodes : no         Hemoglobin A1c has ranged from 6.7% in 2015, peaking at 12.8% in 2021. Patient required assistance for hypoglycemia: no Patient has required hospitalization within the last 1 year from hyper or hypoglycemia: no  In terms of diet, the patient eats 2-3 meals a day, snacks 2-3 times day,    He follows with the [facility]  He is a school bus driver for [employer]: N/A   Statin: No ACE-I/ARB: No Prior Diabetic Education:yes   METER DOWNLOAD SUMMARY: Does not check    DIABETIC COMPLICATIONS: Microvascular complications:    Denies: CKD, neuropathy, retinopathy   Last eye exam: Completed 04/2019  Macrovascular complications:    Denies: CAD, PVD, CVA   PAST HISTORY: Past Medical History:  Past Medical History:  Diagnosis Date  . Anxiety   . Arthritis   . Depression   . Diabetes mellitus without complication ([place])   . Hyperlipidemia   . OSA (obstructive sleep apnea) 01/11/2018   Past Surgical History:  Past Surgical History:  Procedure Laterality Date  . COLONOSCOPY    . NO PAST SURGERIES    . WISDOM TOOTH EXTRACTION        Social History:  reports that he quit smoking about 38 years ago. His smoking use included cigarettes. He has a 39.00 pack-year  smoking history. He has never used smokeless tobacco. He reports current alcohol use. He reports that he does not use drugs. Family History:  Family History  Problem Relation Age of Onset  . Prostate cancer Father   . Pulmonary embolism Father   . Transient ischemic attack Sister   . Cancer Sister        lymph node cancer  . Heart disease Mother   . Heart failure Mother   . Hypertension Other   . Stroke Other   . Clotting disorder Other        Thromboembolism clotting disorder  . Colon cancer Neg Hx   . Stomach cancer Neg Hx   . Rectal cancer Neg Hx   . Pancreatic cancer Neg Hx   . Esophageal cancer Neg Hx   . Liver cancer Neg Hx      HOME MEDICATIONS: Allergies as of 01/01/2020      Reactions   Metformin And Related Diarrhea      Medication List    as of January 01, 2020 10:56 AM   You have not been prescribed any medications.      ALLERGIES: Allergies  Allergen Reactions  . Metformin And Related Diarrhea     REVIEW OF SYSTEMS: A comprehensive ROS was conducted with the patient and is negative except as per HPI and below:  Review of Systems  Gastrointestinal: Negative for diarrhea and nausea.  Genitourinary: Positive for frequency.  Endo/Heme/Allergies: Negative for polydipsia.      OBJECTIVE:   VITAL SIGNS: BP 124/84 (BP Location: Left Arm, Patient Position: Sitting, Cuff Size: Large)   Pulse 84   Ht 5\' 9"  (1.753 m)   Wt 240 lb 12.8 oz (109.2 kg)   SpO2 98%   BMI 35.56 kg/m    PHYSICAL EXAM:  General: Pt appears well and is in NAD  Neck: General: Supple without adenopathy or carotid bruits. Thyroid: Thyroid size normal.  No goiter or nodules appreciated. No thyroid bruit.  Lungs: Clear with good BS bilat with no rales, rhonchi, or wheezes  Heart: RRR with normal S1 and S2 and no gallops; no murmurs; no rub  Abdomen: Normoactive bowel sounds, soft, nontender, without masses or organomegaly palpable  Extremities:  Lower extremities - No  pretibial edema. No lesions.  Skin: Normal texture and temperature to palpation. No rash noted.  Neuro: MS is good with appropriate affect, pt is alert and Ox3    DATA REVIEWED:  Lab Results  Component Value Date   HGBA1C 12.8 (H) 11/29/2019   HGBA1C 7.8 (H) 03/07/2017   HGBA1C 7.4 (H) 08/25/2015   Lab Results  Component Value Date   MICROALBUR 0.9 11/29/2019   LDLCALC 124 (H) 11/29/2019   CREATININE 1.08 11/29/2019   Lab Results  Component Value Date   MICRALBCREAT 0.6 11/29/2019    Lab Results  Component Value Date   CHOL 198 11/29/2019   HDL 54.10 11/29/2019   LDLCALC 124 (H) 11/29/2019   LDLDIRECT 114.4 08/24/2006   TRIG 99.0 11/29/2019   CHOLHDL 4 11/29/2019       In Office BG 353 mg/dL  ASSESSMENT / PLAN / RECOMMENDATIONS:   1) Type 2 Diabetes Mellitus, Poorly controlled, Without  complications - Most recent A1c of 12.8 %. Goal A1c < 7.0%.    Plan: GENERAL: - I have discussed with the patient the pathophysiology of diabetes. We went over the natural progression of the disease. We talked about both insulin resistance and insulin deficiency. We stressed the importance of lifestyle changes including diet and exercise. I explained the complications associated with diabetes including retinopathy, nephropathy, neuropathy as well as increased risk of cardiovascular disease. We went over the benefit seen with glycemic control.   - I explained to the patient that diabetic patients are at higher than normal risk for amputations. - I have advised him that insulin is the recommened treatment at this time , but the patient opted for oral glycemic agent.  We are going to start glipizide, we discussed side effects of hypoglycemia and weight gain, will also start Rybelsus, we discussed GI side effects with this. -Patient encouraged to check glucose at home  MEDICATIONS: - Start Glipizide 10 mg, HALF a tablet before Breakfast and Supper for 1 week, then increase to 1 tablet before  Breakfast and supper  - Start Rybelsus 3 mg daily with Breakfast   EDUCATION / INSTRUCTIONS:  BG monitoring instructions: Patient is instructed to check his blood sugars 1 times a day, fasting   Call [facility] if: BG persistently < 70  . I reviewed the Rule of 15 for the treatment of hypoglycemia in detail with the patient. Literature supplied.   2) Diabetic complications:   Eye: Does not have known diabetic retinopathy.   Neuro/ Feet: Does not have known diabetic peripheral neuropathy.  Renal: Patient does not have known baseline CKD. He is not on an ACEI/ARB at present.  3) Dyslipidemia: Patient is not on a statin. LDL above goal at 124 mg/dL.  We discussed the cardiovascular benefits of statins, as well as side effects with arthralgias and myalgias.  Patient in agreement to start lipid-lowering agents. -He was advised to hold off on statin use for another 4 weeks, until he is used to glycemic agents, to prevent multiple side effects from multiple agents at the same time.   Medication Atorvastatin 10 mg daily   Follow-up in 3 months  Signed electronically by: Mack Guise, MD  [facility] End[facility]in[facility]  [facility] [address] [place], [place] [address] Phone: [phone] FAX: [phone]   CC: Ann Held, DO [address] [place] [address] Phone: [phone]  Fax: [phone]    Return to Endocrinology clinic as below: Future Appointments  Date Time Provider [place]  01/10/2020 12:00 PM Mellody Dance, DO [facility] None

## 2020-01-02 NOTE — Progress Notes (Signed)
Dear Dr. Carollee Herter,   Thank you for referring Calvin Chandler to our clinic. The following note includes my evaluation and treatment recommendations.  Chief Complaint:   OBESITY Calvin Chandler (MR# [id number]) is a 74 y.o. male who presents for evaluation and treatment of obesity and related comorbidities. Current BMI is Body mass index is 36.18 kg/m. Calvin Chandler has been struggling with his weight for many years and has been unsuccessful in either losing weight, maintaining weight loss, or reaching his healthy weight goal.  Calvin Chandler is currently in the action stage of change and ready to dedicate time achieving and maintaining a healthier weight. Calvin Chandler is interested in becoming our patient and working on intensive lifestyle modifications including (but not limited to) diet and exercise for weight loss.  Calvin Chandler craves potato chips.  He lives with his wife, Calvin Chandler.  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  He is not taking any medication currently.  He says he is here because he wants to be healthier.  Calvin Chandler's habits were reviewed today and are as follows: His family eats meals together, he thinks his family will eat healthier with him, his desired weight loss is 51 pounds, he started gaining weight after the [employer], his heaviest weight ever was 240 pounds, he craves potato chips and he is frequently drinking liquids with calories.  Depression Screen Calvin Chandler's Food and Mood (modified PHQ-9) score was 5.  Depression screen PHQ 2/9 12/27/2019  Decreased Interest 1  Down, Depressed, Hopeless 1  PHQ - 2 Score 2  Altered sleeping 1  Tired, decreased energy 1  Change in appetite 1  Feeling bad or failure about yourself  0  Trouble concentrating 0  Moving slowly or fidgety/restless 0  Suicidal thoughts 0  PHQ-9 Score 5   Subjective:   1. Other fatigue Calvin Chandler admits to daytime somnolence and denies waking up still tired. Patent has a history of symptoms of daytime  fatigue and snoring. Calvin Chandler generally gets 6 or 7 hours of sleep per night, and states that he has poor quality sleep. Snoring is present. Apneic episodes are not present. Epworth Sleepiness Score is 6.  2. Shortness of breath on exertion Calvin Chandler notes increasing shortness of breath with exercising and seems to be worsening over time with weight gain. He notes getting out of breath sooner with activity than he used to. This has gotten worse recently. Calvin Chandler denies shortness of breath at rest or orthopnea.  3. History of smoking 30 or more pack years Calvin Chandler smoked for 13 years at 2-3 packs per day.  He quit in 1982 with prayer.  4. Type 2 diabetes mellitus with other specified complication, without long-term current use of insulin (HCC) Poorly controlled.  Medications reviewed. Diabetic ROS: no polyuria or polydipsia, no chest pain, dyspnea or TIA's, no numbness, tingling or pain in extremities.  Recently seen by PCP (8/21 note reviewed).  He does not take any medication (does not tolerate metformin due to diarrhea).  Lab Results  Component Value Date   HGBA1C 12.8 (H) 11/29/2019   HGBA1C 7.8 (H) 03/07/2017   HGBA1C 7.4 (H) 08/25/2015   Lab Results  Component Value Date   MICROALBUR 0.9 11/29/2019   LDLCALC 124 (H) 11/29/2019   CREATININE 1.08 11/29/2019   5. Vitamin D deficiency Calvin Chandler's Vitamin D level was 19.19 on 11/29/2019. He is currently taking no vitamin D supplement. He denies nausea, vomiting or muscle weakness.  He endorses fatigue.  6. Hyperlipidemia associated with type 2 diabetes mellitus (Calvin Chandler) Calvin Chandler has hyperlipidemia and has been trying to improve his cholesterol levels with intensive lifestyle modification including a low saturated fat diet, exercise and weight loss. He denies any chest pain, claudication or myalgias.  Never on medications in the past.  Lab Results  Component Value Date   ALT 17 11/29/2019   AST 15 11/29/2019   ALKPHOS 82 11/29/2019   BILITOT 1.0  11/29/2019   Lab Results  Component Value Date   CHOL 198 11/29/2019   HDL 54.10 11/29/2019   LDLCALC 124 (H) 11/29/2019   LDLDIRECT 114.4 08/24/2006   TRIG 99.0 11/29/2019   CHOLHDL 4 11/29/2019   7. OSA (obstructive sleep apnea) Calvin Chandler has a diagnosis of sleep apnea. He reports that he is using a CPAP regularly.  He has used a CPAP for about 2 years.  8. Depression screening Calvin Chandler was screened for depression today as part of his new patient workup.  PHQ-9 is 5.  9. At risk for hypoglycemia Calvin Chandler is at increased risk for hypoglycemia due to changes in diet and changes in lifestyle.  This is the patient's first visit at [facility].  The patient's NEW PATIENT PACKET that they filled out prior to today's office visit was reviewed at length and some information from that paperwork was also included within the following office visit note.    Included in the packet: current and past health history, medications, allergies, ROS, gynecologic history (women only), surgical history, family history, social history, weight history, weight loss surgery history (for those that have had weight loss surgery), nutritional evaluation, mood and food questionnaire along with a depression screening (PHQ9) on all patients, an Epworth questionnaire, sleep habits questionnaire, patient life and health improvement goals questionnaire. These will all be scanned into the patient's chart under media.   During the visit, I independently reviewed the patient's EKG, bioimpedance scale results, and indirect calorimeter results. I used this information to tailor a meal plan for the patient that will help Calvin Chandler to lose weight and will improve his obesity-related conditions going forward. I performed a medically necessary appropriate examination and/or evaluation. I discussed the assessment and treatment plan with the patient. The patient was provided an opportunity to ask questions and all were answered.  The patient agreed with the plan and demonstrated an understanding of the instructions. Labs were ordered at this visit and will be reviewed at the next visit unless more critical results need to be addressed immediately. Clinical information was updated and documented in the EMR.   Time spent on visit including pre-visit chart review and post-visit care was estimated to be 60-74 minutes.  A separate 15 minutes was spent on risk counseling (see above/below).   Assessment/Plan:   1. Other fatigue Calvin Chandler does feel that his weight is causing his energy to be lower than it should be. Fatigue may be related to obesity, depression or many other causes. Labs will be ordered, and in the meanwhile, Calvin Chandler will focus on self care including making healthy food choices, increasing physical activity and focusing on stress reduction.  - EKG 12-Lead - Vitamin B12 - Folate - T3 - T4, free - TSH  2. Shortness of breath on exertion Calvin Chandler does feel that he gets out of breath more easily that he used to when he exercises. Calvin Chandler's shortness of breath appears to be obesity related and exercise induced. He has agreed to work on weight loss and gradually increase exercise to  treat his exercise induced shortness of breath. Will continue to monitor closely.  - Vitamin B12 - Folate - T3 - T4, free - TSH  3. History of smoking 30 or more pack years Will continue to monitor.  4. Type 2 diabetes mellitus with other specified complication, without long-term current use of insulin (HCC) Good blood sugar control is important to decrease the likelihood of diabetic complications such as nephropathy, neuropathy, limb loss, blindness, coronary artery disease, and death. Intensive lifestyle modification including diet, exercise and weight loss are the first line of treatment for diabetes.  Recently referred to Endo by PCP for further management.  Recently added Tresiba to metformin, but he is not taking any medication!   Weight loss, prudent nutritional plan.  Encouraged him to check blood sugar and keep a log.  5. Vitamin D deficiency Low Vitamin D level contributes to fatigue and are associated with obesity, breast, and colon cancer.  At next office visit, will review labs with him.  Weight loss, prudent nutritional plan.  6. Hyperlipidemia associated with type 2 diabetes mellitus (Calvin Chandler) Cardiovascular risk and specific lipid/LDL goals reviewed.  We discussed several lifestyle modifications today and Calvin Chandler will continue to work on diet, exercise and weight loss efforts. Orders and follow up as documented in patient record.  Next office visit, we will review labs and start medication if needed.  PCP recently added Crestor for poorly controlled LDL.  He is not taking and was not aware.  He will call about script.  Continue to monitor as weight loss occurs.  Counseling Intensive lifestyle modifications are the first line treatment for this issue. . Dietary changes: Increase soluble fiber. Decrease simple carbohydrates. . Exercise changes: Moderate to vigorous-intensity aerobic activity 150 minutes per week if tolerated. . Lipid-lowering medications: see documented in medical record.  7. OSA (obstructive sleep apnea) Intensive lifestyle modifications are the first line treatment for this issue. We discussed several lifestyle modifications today and he will continue to work on diet, exercise and weight loss efforts. We will continue to monitor. Orders and follow up as documented in patient record.  Importance of adherence discussed with him in terms of weight loss and improving symptoms.  8. Depression screening Depression screening is negative.   9. At risk for hypoglycemia Calvin Chandler was given approximately 15 minutes of counseling today regarding prevention of hypoglycemia. He was advised of symptoms of hypoglycemia. Zhamir was instructed to avoid skipping meals, eat regular protein rich meals and schedule low  calorie snacks as needed.   10. Class 2 severe obesity with serious comorbidity and body mass index (BMI) of 36.0 to 36.9 in adult, unspecified obesity type (HCC) Calvin Chandler is currently in the action stage of change and his goal is to continue with weight loss efforts. I recommend Calvin Chandler begin the structured treatment plan as follows:  He has agreed to the Category 2 Plan.  Exercise goals: As is.   Behavioral modification strategies: increasing lean protein intake, decreasing simple carbohydrates, increasing water intake, decreasing liquid calories, meal planning, keeping healthy foods in the home (he drinks a lot of smoothies, sweet tea, juice, etc.), and planning for success.  He was informed of the importance of frequent follow-up visits to maximize his success with intensive lifestyle modifications for his multiple health conditions. He was informed we would discuss his lab results at his next visit unless there is a critical issue that needs to be addressed sooner. Calvin Chandler agreed to keep his next visit at the agreed upon time  to discuss these results.  Objective:   Blood pressure 131/86, pulse 82, temperature 97.6 F (36.4 C), height 5\' 9"  (1.753 m), weight 245 lb (111.1 kg), SpO2 98 %. Body mass index is 36.18 kg/m.  EKG: Normal sinus rhythm, rate 76 bpm.  Indirect Calorimeter completed today shows a VO2 of 325 and a REE of 2266.  His calculated basal metabolic rate is 1610 thus his basal metabolic rate is better than expected.  General: Cooperative, alert, well developed, in no acute distress. HEENT: Conjunctivae and lids unremarkable. Cardiovascular: Regular rhythm.  Lungs: Normal work of breathing. Neurologic: No focal deficits.   Lab Results  Component Value Date   CREATININE 1.08 11/29/2019   BUN 12 11/29/2019   NA 135 11/29/2019   K 4.5 11/29/2019   CL 99 11/29/2019   CO2 29 11/29/2019   Lab Results  Component Value Date   ALT 17 11/29/2019   AST 15 11/29/2019    ALKPHOS 82 11/29/2019   BILITOT 1.0 11/29/2019   Lab Results  Component Value Date   HGBA1C 12.8 (H) 11/29/2019   HGBA1C 7.8 (H) 03/07/2017   HGBA1C 7.4 (H) 08/25/2015   HGBA1C 7.0 (H) 01/27/2015   HGBA1C 6.7 (H) 04/24/2013   Lab Results  Component Value Date   TSH 1.110 12/27/2019   Lab Results  Component Value Date   CHOL 198 11/29/2019   HDL 54.10 11/29/2019   LDLCALC 124 (H) 11/29/2019   LDLDIRECT 114.4 08/24/2006   TRIG 99.0 11/29/2019   CHOLHDL 4 11/29/2019   Lab Results  Component Value Date   WBC 3.5 (L) 01/27/2015   HGB 14.4 01/27/2015   HCT 43.7 01/27/2015   MCV 85.9 01/27/2015   PLT 234.0 01/27/2015   Attestation Statements:   Reviewed by clinician on day of visit: allergies, medications, problem list, medical history, surgical history, family history, social history, and previous encounter notes.  I, Water quality scientist, CMA, am acting as Location manager for [employer], DO.  I have reviewed the above documentation for accuracy and completeness, and I agree with the above. Mellody Dance, DO

## 2020-01-03 ENCOUNTER — Ambulatory Visit (INDEPENDENT_AMBULATORY_CARE_PROVIDER_SITE_OTHER): Payer: Self-pay | Admitting: Family Medicine

## 2020-01-03 ENCOUNTER — Encounter: Payer: Self-pay | Admitting: Internal Medicine

## 2020-01-08 ENCOUNTER — Other Ambulatory Visit: Payer: Self-pay

## 2020-01-08 MED ORDER — ACCU-CHEK GUIDE VI STRP: ORAL_STRIP | 3 refills | Status: DC

## 2020-01-09 ENCOUNTER — Telehealth: Payer: Self-pay | Admitting: Family Medicine

## 2020-01-09 MED ORDER — ACCU-CHEK SOFTCLIX LANCETS MISC: 1.0000 | 12 refills | Status: DC

## 2020-01-09 MED ORDER — ACCU-CHEK SOFTCLIX LANCET DEV KIT: 1.0000 | PACK | 3 refills | Status: DC

## 2020-01-09 NOTE — Telephone Encounter (Signed)
Error

## 2020-01-10 ENCOUNTER — Other Ambulatory Visit: Payer: Self-pay

## 2020-01-10 ENCOUNTER — Ambulatory Visit (INDEPENDENT_AMBULATORY_CARE_PROVIDER_SITE_OTHER): Payer: BC Managed Care – PPO | Admitting: Family Medicine

## 2020-01-10 ENCOUNTER — Encounter (INDEPENDENT_AMBULATORY_CARE_PROVIDER_SITE_OTHER): Payer: Self-pay | Admitting: Family Medicine

## 2020-01-10 VITALS — BP 109/69 | HR 73 | Temp 97.7°F | Ht 69.0 in | Wt 234.2 lb

## 2020-01-10 DIAGNOSIS — Z6834 Body mass index (BMI) 34.0-34.9, adult: Secondary | ICD-10-CM

## 2020-01-10 DIAGNOSIS — E785 Hyperlipidemia, unspecified: Secondary | ICD-10-CM

## 2020-01-10 DIAGNOSIS — E559 Vitamin D deficiency, unspecified: Secondary | ICD-10-CM | POA: Diagnosis not present

## 2020-01-10 DIAGNOSIS — E669 Obesity, unspecified: Secondary | ICD-10-CM | POA: Diagnosis not present

## 2020-01-10 DIAGNOSIS — Z9189 Other specified personal risk factors, not elsewhere classified: Secondary | ICD-10-CM | POA: Diagnosis not present

## 2020-01-10 DIAGNOSIS — E1169 Type 2 diabetes mellitus with other specified complication: Secondary | ICD-10-CM | POA: Diagnosis not present

## 2020-01-10 MED ORDER — VITAMIN D (ERGOCALCIFEROL) 1.25 MG (50000 UNIT) PO CAPS: 50000.0000 [IU] | ORAL_CAPSULE | ORAL | 0 refills | Status: DC

## 2020-01-10 NOTE — Progress Notes (Addendum)
Chief Complaint:   OBESITY Calvin Chandler is here to discuss his progress with his obesity treatment plan along with follow-up of his obesity related diagnoses. Calvin Chandler is on the Category 2 Plan and states he is following his eating plan approximately 100% of the time. Calvin Chandler states he is walking for 20 minutes 7 times per week.  Today's visit was #: 2 Starting weight: 245 lbs Starting date: 245 lbs Today's weight: 234 lbs Today's date: 01/10/2020 Total lbs lost to date: 11 lbs Total lbs lost since last in-office visit: 11 lbs  Interim History: Calvin Chandler followed the plan "100%".  No hunger or cravings.  In general, he "feels great" and has no concerns with the plan.  He eats fruit toward his snack calories only and eats nothing off plan.  Weighing foods/ measuring, has support of his wife Calvin Chandler.    Subjective:   1. Type 2 diabetes mellitus with other specified complication, without long-term current use of insulin (HCC) Medications reviewed. Diabetic ROS: no polyuria or polydipsia, dyspnea, no numbness or tingling in extremities.   FBS 108, 128, 117, 135, 158.  A1c 12.8.    Endo doc- dr Kelton Pillar- recently started him on DM medications.  Pt declined insulin and he was started on glipizide at 1/2 tablet of a 10mg  ( 5mg )  twice daily in addition to Rybelsus 3mg  qd.    However, he has had low blood sugars and possibly one low blood sugar episode since following the prudent meal plan.      Lab Results  Component Value Date   HGBA1C 12.8 (H) 11/29/2019   HGBA1C 7.8 (H) 03/07/2017   HGBA1C 7.4 (H) 08/25/2015   Lab Results  Component Value Date   MICROALBUR 0.9 11/29/2019   LDLCALC 124 (H) 11/29/2019   CREATININE 1.08 11/29/2019    2. Hyperlipidemia associated with type 2 diabetes mellitus ([place]) Calvin Chandler has hyperlipidemia and has been trying to improve his cholesterol levels with intensive lifestyle modification including a low saturated fat diet, exercise and weight loss. He denies any  chest pain, claudication or myalgias.  He has not started Lipitor yet given to him by Endo doc.  Reviewed labs with him from 11/29/2019.  Lab Results  Component Value Date   ALT 17 11/29/2019   AST 15 11/29/2019   ALKPHOS 82 11/29/2019   BILITOT 1.0 11/29/2019   Lab Results  Component Value Date   CHOL 198 11/29/2019   HDL 54.10 11/29/2019   LDLCALC 124 (H) 11/29/2019   LDLDIRECT 114.4 08/24/2006   TRIG 99.0 11/29/2019   CHOLHDL 4 11/29/2019   3. Vitamin D deficiency Calvin Chandler's Vitamin D level was 19.19 on 11/29/2019. He is currently taking no vitamin D supplement. He denies nausea, vomiting or muscle weakness.  Labs obtained.  Never started vitamin D we gave him.  4. At risk for heart disease Due to [name]'s current state of health and medical condition(s), he is at a higher risk for heart disease.   This puts the patient at much greater risk to subsequently develop cardiopulmonary conditions that can significantly affect patient's quality of life in a negative manner as well.    At least 15+ minutes was spent on counseling Calvin Chandler about these concerns today and I stressed the importance of reversing risks factors of obesity, esp truncal and visceral fat, hypertension, hyperlipidemia, pre-diabetes.   Initial goal is to lose at least 5-10% of starting weight to help reduce these risk factors.   Counseling: Intensive lifestyle modifications discussed  with Calvin Chandler as most appropriate first line treatment.  he will continue to work on diet, exercise and weight loss efforts.  We will continue to reassess these conditions on a fairly regular basis in an attempt to decrease patient's overall morbidity and mortality.  Evidence-based interventions for health behavior change were utilized today including the discussion of self monitoring techniques, problem-solving barriers and SMART goal setting techniques.  Specifically regarding patient's less desirable eating habits and patterns, we employed the  technique of small changes when Arjen has not been able to fully commit to his prudent nutritional plan.  Assessment/Plan:   1. Type 2 diabetes mellitus with other specified complication, without long-term current use of insulin ([place]) Discussed labs with patient today.    Poorly controlled.  Lab Results  Component Value Date   HGBA1C 12.8 (H) 11/29/2019   HGBA1C 7.8 (H) 03/07/2017   HGBA1C 7.4 (H) 08/25/2015   Good blood sugar control is important to decrease the likelihood of diabetic complications such as nephropathy, neuropathy, limb loss, blindness, coronary artery disease, and death. Intensive lifestyle modification including diet, exercise and weight loss are the first line of treatment for diabetes.    Pt is tolerating new DM medicines overall, very well. I recommend he does not increase his dose to 1 tablet/ 10mg  twice daily of glipizide today due to a few low BS episodes.  Continue glipizide at 5 mg twice daily and Rybelsus as is.    - Continue home blood sugar monitoring regularly - closely.    - Eat on a regular basis- no skipping or going long periods.   - We discussed hypoglycemia prevention today.   - Recheck labs in 3 months.  - He should contact his endocrinologist as he sees fit with any Q's/ concerns with what we discuss.  F/up with them as scheduled.   2. Hyperlipidemia associated with type 2 diabetes mellitus ([place]) New onset.  Discussed labs with patient today.    LDL Not at goal.  Cardiovascular risk and specific lipid/LDL goals reviewed.  We discussed several lifestyle modifications today and Calvin Chandler will continue to work on diet, exercise and weight loss efforts. Orders and follow up as documented in patient record.    --> Told him he needs to start the Lipitor given to him by Endocrinology recently.  ALT 6 weeks, FLP 2-3 months along with other labs.  Continue prudent nutritional plan with low sat/trans fat meal plan.   Counseling Intensive lifestyle  modifications are the first line treatment for this issue. . Dietary changes: Increase soluble fiber. Decrease simple carbohydrates. . Exercise changes: Moderate to vigorous-intensity aerobic activity 150 minutes per week if tolerated. . Lipid-lowering medications: see documented in medical record.  3. Vitamin D deficiency Discussed labs with patient today.    New onset. Not at goal  Low Vitamin D level contributes to fatigue and are associated with obesity, breast, and colon cancer.  - He agrees to start to take prescription Vitamin D @50 ,000 IU every week and will follow-up for routine testing of Vitamin D, at least 2-3 times per year to avoid over-replacement.  Recheck levels in 8-12 weeks.  Continue weight loss.  -Start Vitamin D, Ergocalciferol, (DRISDOL) 1.25 MG (50000 UNIT) CAPS capsule; Take 1 capsule (50,000 Units total) by mouth every 7 (seven) days.  Dispense: 4 capsule; Refill: 0  4. At risk for heart disease Due to Calvin Chandler's current state of health and medical condition(s), he is at a higher risk for heart disease.  This puts the patient at much greater risk to subsequently develop cardiopulmonary conditions that can significantly affect patient's quality of life in a negative manner as well.    At least 28 minutes was spent on counseling Calvin Chandler about these concerns today and I stressed the importance of reversing risks factors of obesity, esp truncal and visceral fat, hypertension, hyperlipidemia, pre-diabetes.   Initial goal is to lose at least 5-10% of starting weight to help reduce these risk factors.   Counseling: Intensive lifestyle modifications discussed with Calvin Chandler as most appropriate first line treatment.  he will continue to work on diet, exercise and weight loss efforts.  We will continue to reassess these conditions on a fairly regular basis in an attempt to decrease patient's overall morbidity and mortality.  Evidence-based interventions for health behavior change were  utilized today including the discussion of self monitoring techniques, problem-solving barriers and SMART goal setting techniques.  Specifically regarding patient's less desirable eating habits and patterns, we employed the technique of small changes when Calvin Chandler has not been able to fully commit to his prudent nutritional plan.  5. Class 1 obesity with serious comorbidity and body mass index (BMI) of 34.0 to 34.9 in adult, unspecified obesity type  Calvin Chandler is currently in the action stage of change. As such, his goal is to continue with weight loss efforts. He has agreed to the Category 2 Plan.   Exercise goals:   150 minutes/week goal.  Behavioral modification strategies: increasing lean protein intake, increasing water intake, better snacking choices, avoiding temptations and planning for success.  Calvin Chandler has agreed to follow-up with our clinic in 2 weeks.   He was informed of the importance of frequent follow-up visits to maximize his success with intensive lifestyle modifications for his multiple health conditions.   Objective:   Blood pressure 109/69, pulse 73, temperature 97.7 F (36.5 C), height 5\' 9"  (1.753 m), weight 234 lb 3.2 oz (106.2 kg), SpO2 98 %. Body mass index is 34.59 kg/m.  General: Cooperative, alert, well developed, in no acute distress. HEENT: Conjunctivae and lids unremarkable. Cardiovascular: Regular rhythm.  Lungs: Normal work of breathing. Neurologic: No focal deficits.   Lab Results  Component Value Date   CREATININE 1.08 11/29/2019   BUN 12 11/29/2019   NA 135 11/29/2019   K 4.5 11/29/2019   CL 99 11/29/2019   CO2 29 11/29/2019   Lab Results  Component Value Date   ALT 17 11/29/2019   AST 15 11/29/2019   ALKPHOS 82 11/29/2019   BILITOT 1.0 11/29/2019   Lab Results  Component Value Date   HGBA1C 12.8 (H) 11/29/2019   HGBA1C 7.8 (H) 03/07/2017   HGBA1C 7.4 (H) 08/25/2015   HGBA1C 7.0 (H) 01/27/2015   HGBA1C 6.7 (H) 04/24/2013   Lab Results    Component Value Date   TSH 1.110 12/27/2019   Lab Results  Component Value Date   CHOL 198 11/29/2019   HDL 54.10 11/29/2019   LDLCALC 124 (H) 11/29/2019   LDLDIRECT 114.4 08/24/2006   TRIG 99.0 11/29/2019   CHOLHDL 4 11/29/2019   Lab Results  Component Value Date   WBC 3.5 (L) 01/27/2015   HGB 14.4 01/27/2015   HCT 43.7 01/27/2015   MCV 85.9 01/27/2015   PLT 234.0 01/27/2015   Attestation Statements:   Reviewed by clinician on day of visit: allergies, medications, problem list, medical history, surgical history, family history, social history, and previous encounter notes.  I, Water quality scientist, CMA, am acting as Location manager for  Mellody Dance, DO.  I have reviewed the above documentation for accuracy and completeness, and I agree with the above. Mellody Dance, DO

## 2020-01-28 ENCOUNTER — Other Ambulatory Visit: Payer: Self-pay

## 2020-01-28 ENCOUNTER — Ambulatory Visit (INDEPENDENT_AMBULATORY_CARE_PROVIDER_SITE_OTHER): Payer: BC Managed Care – PPO | Admitting: Family Medicine

## 2020-01-28 ENCOUNTER — Encounter (INDEPENDENT_AMBULATORY_CARE_PROVIDER_SITE_OTHER): Payer: Self-pay | Admitting: Family Medicine

## 2020-01-28 VITALS — BP 132/75 | HR 70 | Temp 97.5°F | Ht 69.0 in | Wt 233.0 lb

## 2020-01-28 DIAGNOSIS — E669 Obesity, unspecified: Secondary | ICD-10-CM

## 2020-01-28 DIAGNOSIS — E559 Vitamin D deficiency, unspecified: Secondary | ICD-10-CM

## 2020-01-28 DIAGNOSIS — E1169 Type 2 diabetes mellitus with other specified complication: Secondary | ICD-10-CM

## 2020-01-28 DIAGNOSIS — Z6834 Body mass index (BMI) 34.0-34.9, adult: Secondary | ICD-10-CM | POA: Diagnosis not present

## 2020-01-28 HISTORY — DX: Vitamin D deficiency, unspecified: E55.9

## 2020-01-28 MED ORDER — VITAMIN D (ERGOCALCIFEROL) 1.25 MG (50000 UNIT) PO CAPS: 50000.0000 [IU] | ORAL_CAPSULE | ORAL | 0 refills | Status: DC

## 2020-01-28 MED ORDER — GLIPIZIDE ER 2.5 MG PO TB24: 2.5000 mg | ORAL_TABLET | Freq: Every day | ORAL | 0 refills | Status: DC

## 2020-01-30 NOTE — Progress Notes (Addendum)
Chief Complaint:   OBESITY Calvin Chandler is here to discuss his progress with his obesity treatment plan along with follow-up of his obesity related diagnoses. Calvin Chandler is on the Category 2 Plan and states he is following his eating plan approximately 100% of the time. Calvin Chandler states he is doing an abdominal workout and walking for 10 minutes 7 times per week.  Today's visit was #: 3 Starting weight: 245 lbs Starting date: 12/27/2019 Today's weight: 233 lbs Today's date: 01/28/2020 Total lbs lost to date: 12 lbs Total lbs lost since last in-office visit: 1 lb  Interim History: Calvin Chandler is frustrated that he is not losing more weight.  For breakfast, 2 slices of bread, 1 slice of [place], and 1 slice of light cheese.  For dinner, he has a frozen Hungry Man type dinner (not sure of protein amounts).  Calories are sometimes more and sometimes less than goal.  Not measuring ounces of protein at lunch or dinner.  Assessment/Plan:   1. Type 2 diabetes mellitus with other specified complication, without long-term current use of insulin (HCC) FBS: 78, 88, 103, 110, 109, 107, 97.  He is on glipizide 5 mg and oral semaglutide 3 mg daily.  Denies hypoglycemia symptoms.  Plan:  Continue to monitor blood sugar at home and if develop any low blood sugar symptoms or if FBS are consistently less than 100, decrease glipizide to 2.5 mg XL from 5 mg every 12 hour tablets.  New script written.  Lab Results  Component Value Date   HGBA1C 12.8 (H) 11/29/2019   HGBA1C 7.8 (H) 03/07/2017   HGBA1C 7.4 (H) 08/25/2015   Lab Results  Component Value Date   MICROALBUR 0.9 11/29/2019   LDLCALC 124 (H) 11/29/2019   CREATININE 1.08 11/29/2019   -Refill and decrease dose:  glipiZIDE (GLUCOTROL XL) 2.5 MG 24 hr tablet; Take 1 tablet (2.5 mg total) by mouth daily with breakfast. (24hr tab)  Dispense: 30 tablet; Refill: 0    2. Vitamin D deficiency  Calvin Chandler has a history of Vitamin D deficiency with resultant  generalized fatigue as his primary symptom.    We started pt last OV on vitamin D 50,000 IU weekly for this deficiency and tolerating it well without side-effect.    Most recent Vitamin D lab reviewed-  level: 19.19.   Plan:  - Reiterated importance of vitamin D (as well as calcium) to their health and wellbeing.  - Reminded pt that weight loss will likely improve availability of vitamin D, thus encouraged Calvin Chandler to continue with meal plan and their weight loss efforts to further improve this condition. - I recommend pt continue to take weekly prescription vit D 50,000 IU - Informed patient this may be a lifelong thing, and she was encouraged to continue to take the medicine until told otherwise.   - We will need to monitor levels regularly (every 3-4 mo on average) to keep levels within normal limits.  - pt's questions and concerns regarding this condition addressed.  -Refill Vitamin D, Ergocalciferol, (DRISDOL) 1.25 MG (50000 UNIT) CAPS capsule; Take 1 capsule (50,000 Units total) by mouth every 7 (seven) days.  Dispense: 4 capsule; Refill: 0    3. Class 1 obesity with serious comorbidity and body mass index (BMI) of 34.0 to 34.9 in adult, unspecified obesity type  Calvin Chandler is currently in the action stage of change. As such, his goal is to continue with weight loss efforts. He has agreed to the Category 2 Plan.  Exercise goals: For substantial health benefits, adults should do at least 150 minutes (2 hours and 30 minutes) a week of moderate-intensity, or 75 minutes (1 hour and 15 minutes) a week of vigorous-intensity aerobic physical activity, or an equivalent combination of moderate- and vigorous-intensity aerobic activity. Aerobic activity should be performed in episodes of at least 10 minutes, and preferably, it should be spread throughout the week.  Behavioral modification strategies: increasing lean protein intake, no skipping meals, meal planning and cooking strategies, keeping  healthy foods in the home and better snacking choices.  [name] has agreed to follow-up with our clinic in 2-3 weeks. He was informed of the importance of frequent follow-up visits to maximize his success with intensive lifestyle modifications for his multiple health conditions.   Objective:   Blood pressure 132/75, pulse 70, temperature (!) 97.5 F (36.4 C), height 5\' 9"  (1.753 m), weight 233 lb (105.7 kg), SpO2 99 %. Body mass index is 34.41 kg/m.  General: Cooperative, alert, well developed, in no acute distress. HEENT: Conjunctivae and lids unremarkable. Cardiovascular: Regular rhythm.  Lungs: Normal work of breathing. Neurologic: No focal deficits.   Lab Results  Component Value Date   CREATININE 1.08 11/29/2019   BUN 12 11/29/2019   NA 135 11/29/2019   K 4.5 11/29/2019   CL 99 11/29/2019   CO2 29 11/29/2019   Lab Results  Component Value Date   ALT 17 11/29/2019   AST 15 11/29/2019   ALKPHOS 82 11/29/2019   BILITOT 1.0 11/29/2019   Lab Results  Component Value Date   HGBA1C 12.8 (H) 11/29/2019   HGBA1C 7.8 (H) 03/07/2017   HGBA1C 7.4 (H) 08/25/2015   HGBA1C 7.0 (H) 01/27/2015   HGBA1C 6.7 (H) 04/24/2013   Lab Results  Component Value Date   TSH 1.110 12/27/2019   Lab Results  Component Value Date   CHOL 198 11/29/2019   HDL 54.10 11/29/2019   LDLCALC 124 (H) 11/29/2019   LDLDIRECT 114.4 08/24/2006   TRIG 99.0 11/29/2019   CHOLHDL 4 11/29/2019   Lab Results  Component Value Date   WBC 3.5 (L) 01/27/2015   HGB 14.4 01/27/2015   HCT 43.7 01/27/2015   MCV 85.9 01/27/2015   PLT 234.0 01/27/2015   Obesity Behavioral Intervention:   Approximately 9 minutes were spent on the discussion below.  ASK: We discussed the diagnosis of obesity with Calvin Chandler today and Calvin Chandler agreed to give [place] permission to discuss obesity behavioral modification therapy today.  ASSESS: Calvin Chandler has the diagnosis of obesity and his BMI today is 34.5. Calvin Chandler is in the action stage  of change.   ADVISE: Calvin Chandler was educated on the multiple health risks of obesity as well as the benefit of weight loss to improve his health. He was advised of the need for long term treatment and the importance of lifestyle modifications to improve his current health and to decrease his risk of future health problems.  AGREE: Multiple dietary modification options and treatment options were discussed and Azekiel agreed to follow the recommendations documented in the above note.  ARRANGE: Calvin Chandler was educated on the importance of frequent visits to treat obesity as outlined per CMS and USPSTF guidelines and agreed to schedule his next follow up appointment today.  Attestation Statements:   Reviewed by clinician on day of visit: allergies, medications, problem list, medical history, surgical history, family history, social history, and previous encounter notes.  I, Water quality scientist, CMA, am acting as Location manager for [employer], DO.  I have  reviewed the above documentation for accuracy and completeness, and I agree with the above. Calvin Chandler, D.O.  The [place] was signed into law in 2016 which includes the topic of electronic health records.  This provides immediate access to information in MyChart.  This includes consultation notes, operative notes, office notes, lab results and pathology reports.  If you have any questions about what you read please let us know at your next visit so we can discuss your concerns and take corrective action if need be.  We are right here with you.

## 2020-02-01 ENCOUNTER — Ambulatory Visit: Payer: BC Managed Care – PPO | Attending: Internal Medicine

## 2020-02-01 DIAGNOSIS — Z23 Encounter for immunization: Secondary | ICD-10-CM

## 2020-02-01 NOTE — Progress Notes (Signed)
   Covid-19 Vaccination Clinic  Name:  Calvin Chandler    MRN: [id number] DOB: Mar 08, 1946  02/01/2020  Calvin Chandler was observed post Covid-19 immunization for 15 minutes without incident. He was provided with Vaccine Information Sheet and instruction to access the V-Safe system.   Calvin Chandler was instructed to call 911 with any severe reactions post vaccine: Marland Kitchen Difficulty breathing  . Swelling of face and throat  . A fast heartbeat  . A bad rash all over body  . Dizziness and weakness

## 2020-02-13 ENCOUNTER — Other Ambulatory Visit: Payer: Self-pay

## 2020-02-13 ENCOUNTER — Encounter (INDEPENDENT_AMBULATORY_CARE_PROVIDER_SITE_OTHER): Payer: Self-pay | Admitting: Family Medicine

## 2020-02-13 ENCOUNTER — Ambulatory Visit (INDEPENDENT_AMBULATORY_CARE_PROVIDER_SITE_OTHER): Payer: BC Managed Care – PPO | Admitting: Family Medicine

## 2020-02-13 VITALS — BP 113/63 | HR 76 | Temp 97.5°F | Ht 69.0 in | Wt 229.0 lb

## 2020-02-13 DIAGNOSIS — E1169 Type 2 diabetes mellitus with other specified complication: Secondary | ICD-10-CM | POA: Diagnosis not present

## 2020-02-13 DIAGNOSIS — Z6833 Body mass index (BMI) 33.0-33.9, adult: Secondary | ICD-10-CM

## 2020-02-13 DIAGNOSIS — E785 Hyperlipidemia, unspecified: Secondary | ICD-10-CM | POA: Diagnosis not present

## 2020-02-13 DIAGNOSIS — E669 Obesity, unspecified: Secondary | ICD-10-CM | POA: Diagnosis not present

## 2020-02-13 DIAGNOSIS — E559 Vitamin D deficiency, unspecified: Secondary | ICD-10-CM | POA: Diagnosis not present

## 2020-02-13 MED ORDER — ACCU-CHEK GUIDE VI STRP: ORAL_STRIP | 3 refills | Status: DC

## 2020-02-14 NOTE — Progress Notes (Signed)
Chief Complaint:   OBESITY Calvin Chandler is here to discuss his progress with his obesity treatment plan along with follow-up of his obesity related diagnoses. Calvin Chandler is on the Category 2 Plan and states he is following his eating plan approximately 99% of the time. Calvin Chandler states he is exercising for 0 minutes 0 times per week.  Today's visit was #: 4 Starting weight: 245 lbs Starting date: 12/27/2019 Today's weight: 229 lbs Today's date: 02/13/2020 Total lbs lost to date: 16 lbs Total lbs lost since last in-office visit: 4 lbs Total weight loss percentage to date: -6.53%  Interim History: Calvin Chandler says the meal plan is going well, but he had the [employer] homecoming and ate off plan some.  He chose chicken and vegetables and did not eat ribs or macaroni and cheese, etc.  Hunger and cravings are good.  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  He is still working admin/clerical work now.  Assessment/Plan:   1. 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  FBS 100-135 at the highest.  He had a low of 77 once, but no symptoms of hypoglycemia and he had no concerns at all.  Much improved since we decreased his dose of glipizide, he says.  Plan:  Follow-up with Endocrinology as scheduled.  Recheck A1c in early December.  Continue lower dose of glipizide and Rybelsus at current dose.  Should consider ARB or ACE in the future.  Home blood sugar monitoring daily.  Lab Results  Component Value Date   HGBA1C 12.8 (H) 11/29/2019   HGBA1C 7.8 (H) 03/07/2017   HGBA1C 7.4 (H) 08/25/2015   Lab Results  Component Value Date   MICROALBUR 0.9 11/29/2019   LDLCALC 124 (H) 11/29/2019   CREATININE 1.08 11/29/2019   -Refill glucose blood (ACCU-CHEK GUIDE) test strip;  Use 2-4 times daily as needed  Dispense: 375 each; Refill: 3  2. Hyperlipidemia associated with type 2 diabetes mellitus Calvin Chandler) Endocrinology started him on Lipitor at the end of September.  Tolerating well.  Denies myalgias/generalized aches/pains.  Denies concerns regarding medication today.  Plan:  Continue Lipitor every night.  Adequate hydration stressed.  Check CMP, FLP in early December, around 2 months after starting statin.  Reminded him that he needs to follow-up with his PCP for chronic care of these chronic conditions as well.  Lab Results  Component Value Date   ALT 17 11/29/2019   AST 15 11/29/2019   ALKPHOS 82 11/29/2019   BILITOT 1.0 11/29/2019   Lab Results  Component Value Date   CHOL 198 11/29/2019   HDL 54.10 11/29/2019   LDLCALC 124 (H) 11/29/2019   LDLDIRECT 114.4 08/24/2006   TRIG 99.0 11/29/2019   CHOLHDL 4 11/29/2019   3. Vitamin D deficiency Calvin Chandler's Vitamin D level was 19.19 on 11/29/2019. He is currently taking prescription vitamin D 50,000 IU each week. He denies nausea, vomiting or muscle weakness.  Tolerating well.  Good compliance.  No concerns.  Improved energy levels since starting vitamin D and meal plan.  Plan:  Declines need for refill today.  Continue weekly vitamin D.  Recheck vitamin D level in early December.  Will also check CMP for calcium level.  4. Class 1 obesity  with serious comorbidity and body mass index (BMI) of 33.0 to 33.9 in adult, unspecified obesity type  Calvin Chandler is currently in the action stage of change. As such, his goal is to continue with weight loss efforts. He has agreed to the Category 2 Plan.   Exercise goals: All adults should avoid inactivity. Some physical activity is better than none, and adults who participate in any amount of physical activity gain some health benefits. Advised to start walking some each day.  Behavioral modification strategies: decreasing simple carbohydrates, no skipping meals and dealing with  family, friend, or coworker sabotage, strategies for staying on track.  Calvin Chandler has agreed to follow-up with our clinic in 2 weeks, but in early December, I recommend we recheck his A1c, FLP, CMP, and vitamin D. He was informed of the importance of frequent follow-up visits to maximize his success with intensive lifestyle modifications for his multiple health conditions.   Objective:   Blood pressure 113/63, pulse 76, temperature (!) 97.5 F (36.4 C), height 5\' 9"  (1.753 m), weight 229 lb (103.9 kg), SpO2 98 %. Body mass index is 33.82 kg/m.  General: Cooperative, alert, well developed, in no acute distress. HEENT: Conjunctivae and lids unremarkable. Cardiovascular: Regular rhythm.  Lungs: Normal work of breathing. Neurologic: No focal deficits.   Lab Results  Component Value Date   CREATININE 1.08 11/29/2019   BUN 12 11/29/2019   NA 135 11/29/2019   K 4.5 11/29/2019   CL 99 11/29/2019   CO2 29 11/29/2019   Lab Results  Component Value Date   ALT 17 11/29/2019   AST 15 11/29/2019   ALKPHOS 82 11/29/2019   BILITOT 1.0 11/29/2019   Lab Results  Component Value Date   HGBA1C 12.8 (H) 11/29/2019   HGBA1C 7.8 (H) 03/07/2017   HGBA1C 7.4 (H) 08/25/2015   HGBA1C 7.0 (H) 01/27/2015   HGBA1C 6.7 (H) 04/24/2013   Lab Results  Component Value Date   TSH 1.110 12/27/2019   Lab Results  Component Value Date   CHOL 198 11/29/2019   HDL 54.10 11/29/2019   LDLCALC 124 (H) 11/29/2019   LDLDIRECT 114.4 08/24/2006   TRIG 99.0 11/29/2019   CHOLHDL 4 11/29/2019   Lab Results  Component Value Date   WBC 3.5 (L) 01/27/2015   HGB 14.4 01/27/2015   HCT 43.7 01/27/2015   MCV 85.9 01/27/2015   PLT 234.0 01/27/2015   Obesity Behavioral Intervention:   Approximately 15 minutes were spent on the discussion below.  ASK: We discussed the diagnosis of obesity with Calvin Chandler today and Calvin Chandler agreed to give [place] permission to discuss obesity behavioral modification therapy  today.  ASSESS: Calvin Chandler has the diagnosis of obesity and his BMI today is 33.9. Calvin Chandler is in the action stage of change.   ADVISE: Calvin Chandler was educated on the multiple health risks of obesity as well as the benefit of weight loss to improve his health. He was advised of the need for long term treatment and the importance of lifestyle modifications to improve his current health and to decrease his risk of future health problems.  AGREE: Multiple dietary modification options and treatment options were discussed and Calvin Chandler agreed to follow the recommendations documented in the above note.  ARRANGE: Calvin Chandler was educated on the importance of frequent visits to treat obesity as outlined per CMS and USPSTF guidelines and agreed to schedule his next follow up appointment today.  Attestation Statements:   Reviewed by clinician on day of visit: allergies, medications, problem list, medical  history, surgical history, family history, social history, and previous encounter notes.  I, Water quality scientist, CMA, am acting as Location manager for [employer], DO.  I have reviewed the above documentation for accuracy and completeness, and I agree with the above. Marjory Sneddon, D.O.  The [place] was signed into law in 2016 which includes the topic of electronic health records.  This provides immediate access to information in MyChart.  This includes consultation notes, operative notes, office notes, lab results and pathology reports.  If you have any questions about what you read please let us know at your next visit so we can discuss your concerns and take corrective action if need be.  We are right here with you.

## 2020-02-23 ENCOUNTER — Other Ambulatory Visit (INDEPENDENT_AMBULATORY_CARE_PROVIDER_SITE_OTHER): Payer: Self-pay | Admitting: Family Medicine

## 2020-02-23 DIAGNOSIS — E1169 Type 2 diabetes mellitus with other specified complication: Secondary | ICD-10-CM

## 2020-02-27 ENCOUNTER — Other Ambulatory Visit: Payer: Self-pay

## 2020-02-27 ENCOUNTER — Ambulatory Visit (INDEPENDENT_AMBULATORY_CARE_PROVIDER_SITE_OTHER): Payer: BC Managed Care – PPO | Admitting: Family Medicine

## 2020-02-27 VITALS — BP 99/62 | HR 91 | Temp 97.8°F | Ht 69.0 in | Wt 226.0 lb

## 2020-02-27 DIAGNOSIS — Z6833 Body mass index (BMI) 33.0-33.9, adult: Secondary | ICD-10-CM

## 2020-02-27 DIAGNOSIS — K5909 Other constipation: Secondary | ICD-10-CM

## 2020-02-27 DIAGNOSIS — E669 Obesity, unspecified: Secondary | ICD-10-CM

## 2020-02-27 DIAGNOSIS — E1169 Type 2 diabetes mellitus with other specified complication: Secondary | ICD-10-CM

## 2020-02-27 DIAGNOSIS — E559 Vitamin D deficiency, unspecified: Secondary | ICD-10-CM

## 2020-02-27 HISTORY — DX: Other constipation: K59.09

## 2020-02-27 MED ORDER — GLIPIZIDE ER 2.5 MG PO TB24: 2.5000 mg | ORAL_TABLET | Freq: Every day | ORAL | 0 refills | Status: DC

## 2020-02-27 MED ORDER — POLYETHYLENE GLYCOL 3350 17 G PO PACK: 17.0000 g | PACK | Freq: Every day | ORAL | 0 refills | Status: AC

## 2020-02-27 MED ORDER — VITAMIN D (ERGOCALCIFEROL) 1.25 MG (50000 UNIT) PO CAPS: 50000.0000 [IU] | ORAL_CAPSULE | ORAL | 0 refills | Status: DC

## 2020-03-04 NOTE — Progress Notes (Signed)
Chief Complaint:   OBESITY Calvin Chandler is here to discuss his progress with his obesity treatment plan along with follow-up of his obesity related diagnoses. Calvin Chandler is on the Category 2 Plan and states he is following his eating plan approximately 95% of the time. Calvin Chandler states he is walking 20 minutes 5 times per week.  Today's visit was #: 5 Starting weight: 245 lbs Starting date: 12/27/2019 Today's weight: 226 lbs Today's date: 02/27/2020 Total lbs lost to date: 19 lbs Total lbs lost since last in-office visit: 3 lbs Total weight loss percentage to date: -7.76%  Interim History:  - Calvin Chandler is not celebrating Thanksgiving due to being a Jehovah's witness. He denies issues with the meal plan. He is very happy with the plan and his progress.  He tells me he wants to be called "[name]" now.   Calvin Chandler denies hunger or craving.   Assessment/Plan:   Meds ordered this encounter  Medications  . glipiZIDE (GLUCOTROL XL) 2.5 MG 24 hr tablet    Sig: Take 1 tablet (2.5 mg total) by mouth daily with breakfast. (24hr tab)    Dispense:  30 tablet    Refill:  0  . Vitamin D, Ergocalciferol, (DRISDOL) 1.25 MG (50000 UNIT) CAPS capsule    Sig: Take 1 capsule (50,000 Units total) by mouth every 7 (seven) days.    Dispense:  4 capsule    Refill:  0  . polyethylene glycol (MIRALAX) 17 g packet    Sig: Take 17 g by mouth daily. prn constipation    Dispense:  14 each    Refill:  0    1. Type 2 diabetes mellitus with other specified complication, without long-term current use of insulin (HCC) Calvin Chandler's A1c 3 months ago was 12.8. He is a patient of [facility]--> Dr. Kelton Pillar at [facility].  Calvin Chandler reports no fasting blood sugar lows at all and reports 102-126 on a regular basis. He is very stable and feeling well.  Plan:  We will refill Glipizide, per Calvin Chandler's request; 2.5 mg daily.   Calvin Chandler will also continue Rybelsus 3 mg daily.   I recommend that Calvin Chandler keep his office visit with his PCP and  endocrinologist for chronic care.  Refill- glipiZIDE (GLUCOTROL XL) 2.5 MG 24 hr tablet; Take 1 tablet (2.5 mg total) by mouth daily with breakfast. (24hr tab)  Dispense: 30 tablet; Refill: 0    2. Other constipation Calvin Chandler states that constipation started 2 weeks. He usually has a bowel movement after each meal daily, but notes that it is now very difficult to go everyday. He got "smooth move". When he does go, it is not hard. Calvin Chandler reports drinking 8+ bottles of water a day.  Plan:  Start Miralax 17 grams once a day as needed for constipation. Calvin Chandler will increase exercise and continue hydration. It is okay to use "smooth move" as well.   3. Vitamin D deficiency Calvin Chandler's Vitamin D level was 19.19 on 11/29/2019. He is currently taking prescription vitamin D 50,000 IU each week. He denies nausea, vomiting or muscle weakness.  Plan:  - Reiterated importance of vitamin D (as well as calcium) to their health and wellbeing.  - Reminded Calvin Chandler that weight loss will likely improve availability of vitamin D, thus encouraged him to continue with meal plan and their weight loss efforts to further improve this condition. - I recommend patient continue to take weekly prescription vit D 50,000 IU - Informed patient this may be a lifelong  thing, and he was encouraged to continue to take the medicine until told otherwise.   - We will need to monitor levels regularly (every 3-4 mo on average) to keep levels within normal limits.  - pt's questions and concerns regarding this condition addressed.  Refill- Vitamin D, Ergocalciferol, (DRISDOL) 1.25 MG (50000 UNIT) CAPS capsule; Take 1 capsule (50,000 Units total) by mouth every 7 (seven) days.  Dispense: 4 capsule; Refill: 0   4. Class 1 obesity with serious comorbidity and body mass index (BMI) of 33.0 to 33.9 in adult, unspecified obesity type Calvin Chandler is currently in the action stage of change. As such, his goal is to continue with weight loss  efforts. He has agreed to the Category 2 Plan.   Exercise goals: Increase walking from 20 minutes a day to 30 minutes 5 days a week.  Behavioral modification strategies: meal planning and cooking strategies and planning for success.  Calvin Chandler has agreed to follow-up with our clinic in 2 weeks. He was informed of the importance of frequent follow-up visits to maximize his success with intensive lifestyle modifications for his multiple health conditions.    Objective:   Blood pressure 99/62, pulse 91, temperature 97.8 F (36.6 C), height 5\' 9"  (1.753 m), weight 226 lb (102.5 kg), SpO2 97 %. Body mass index is 33.37 kg/m.  General: Cooperative, alert, well developed, in no acute distress. HEENT: Conjunctivae and lids unremarkable. Cardiovascular: Regular rhythm.  Lungs: Normal work of breathing. Neurologic: No focal deficits.   Lab Results  Component Value Date   CREATININE 1.08 11/29/2019   BUN 12 11/29/2019   NA 135 11/29/2019   K 4.5 11/29/2019   CL 99 11/29/2019   CO2 29 11/29/2019   Lab Results  Component Value Date   ALT 17 11/29/2019   AST 15 11/29/2019   ALKPHOS 82 11/29/2019   BILITOT 1.0 11/29/2019   Lab Results  Component Value Date   HGBA1C 12.8 (H) 11/29/2019   HGBA1C 7.8 (H) 03/07/2017   HGBA1C 7.4 (H) 08/25/2015   HGBA1C 7.0 (H) 01/27/2015   HGBA1C 6.7 (H) 04/24/2013   No results found for: INSULIN Lab Results  Component Value Date   TSH 1.110 12/27/2019   Lab Results  Component Value Date   CHOL 198 11/29/2019   HDL 54.10 11/29/2019   LDLCALC 124 (H) 11/29/2019   LDLDIRECT 114.4 08/24/2006   TRIG 99.0 11/29/2019   CHOLHDL 4 11/29/2019   Lab Results  Component Value Date   WBC 3.5 (L) 01/27/2015   HGB 14.4 01/27/2015   HCT 43.7 01/27/2015   MCV 85.9 01/27/2015   PLT 234.0 01/27/2015    Obesity Behavioral Intervention:   Approximately 15 minutes were spent on the discussion below.  ASK: We discussed the diagnosis of obesity with  Calvin Chandler today and Calvin Chandler agreed to give [place] permission to discuss obesity behavioral modification therapy today.  ASSESS: Calvin Chandler has the diagnosis of obesity and his BMI today is 33.4. Calvin Chandler is in the action stage of change.   ADVISE: Calvin Chandler was educated on the multiple health risks of obesity as well as the benefit of weight loss to improve his health. He was advised of the need for long term treatment and the importance of lifestyle modifications to improve his current health and to decrease his risk of future health problems.  AGREE: Multiple dietary modification options and treatment options were discussed and Calvin Chandler agreed to follow the recommendations documented in the above note.  ARRANGE: Calvin Chandler was educated on  the importance of frequent visits to treat obesity as outlined per CMS and USPSTF guidelines and agreed to schedule his next follow up appointment today.  Attestation Statements:   Reviewed by clinician on day of visit: allergies, medications, problem list, medical history, surgical history, family history, social history, and previous encounter notes.  Coral Ceo, am acting as Location manager for [employer], DO.  I have reviewed the above documentation for accuracy and completeness, and I agree with the above. Marjory Sneddon, D.O.  The [place] was signed into law in 2016 which includes the topic of electronic health records.  This provides immediate access to information in MyChart.  This includes consultation notes, operative notes, office notes, lab results and pathology reports.  If you have any questions about what you read please let us know at your next visit so we can discuss your concerns and take corrective action if need be.  We are right here with you.

## 2020-03-17 ENCOUNTER — Ambulatory Visit (INDEPENDENT_AMBULATORY_CARE_PROVIDER_SITE_OTHER): Payer: MEDICARE | Admitting: Family Medicine

## 2020-03-17 ENCOUNTER — Encounter (INDEPENDENT_AMBULATORY_CARE_PROVIDER_SITE_OTHER): Payer: Self-pay | Admitting: Family Medicine

## 2020-03-17 ENCOUNTER — Other Ambulatory Visit: Payer: Self-pay

## 2020-03-17 VITALS — BP 96/58 | HR 74 | Temp 98.1°F | Ht 69.0 in | Wt 223.0 lb

## 2020-03-17 DIAGNOSIS — K5909 Other constipation: Secondary | ICD-10-CM

## 2020-03-17 DIAGNOSIS — E559 Vitamin D deficiency, unspecified: Secondary | ICD-10-CM

## 2020-03-17 DIAGNOSIS — E1169 Type 2 diabetes mellitus with other specified complication: Secondary | ICD-10-CM

## 2020-03-17 DIAGNOSIS — Z9189 Other specified personal risk factors, not elsewhere classified: Secondary | ICD-10-CM

## 2020-03-17 DIAGNOSIS — Z6833 Body mass index (BMI) 33.0-33.9, adult: Secondary | ICD-10-CM

## 2020-03-17 DIAGNOSIS — E785 Hyperlipidemia, unspecified: Secondary | ICD-10-CM

## 2020-03-17 DIAGNOSIS — E669 Obesity, unspecified: Secondary | ICD-10-CM

## 2020-03-17 MED ORDER — VITAMIN D (ERGOCALCIFEROL) 1.25 MG (50000 UNIT) PO CAPS: 50000.0000 [IU] | ORAL_CAPSULE | ORAL | 0 refills | Status: DC

## 2020-03-18 ENCOUNTER — Encounter (INDEPENDENT_AMBULATORY_CARE_PROVIDER_SITE_OTHER): Payer: Self-pay | Admitting: Family Medicine

## 2020-03-18 LAB — COMPREHENSIVE METABOLIC PANEL
ALT: 15 IU/L (ref 0–44)
AST: 22 IU/L (ref 0–40)
Albumin/Globulin Ratio: 1.4 (ref 1.2–2.2)
Albumin: 4.2 g/dL (ref 3.7–4.7)
Alkaline Phosphatase: 77 IU/L (ref 44–121)
BUN/Creatinine Ratio: 15 (ref 10–24)
BUN: 16 mg/dL (ref 8–27)
Bilirubin Total: 0.7 mg/dL (ref 0.0–1.2)
CO2: 22 mmol/L (ref 20–29)
Calcium: 9.1 mg/dL (ref 8.6–10.2)
Chloride: 105 mmol/L (ref 96–106)
Creatinine, Ser: 1.07 mg/dL (ref 0.76–1.27)
GFR calc Af Amer: 80 mL/min/{1.73_m2} (ref >59)
GFR calc non Af Amer: 69 mL/min/{1.73_m2} (ref >59)
Globulin, Total: 2.9 g/dL (ref 1.5–4.5)
Glucose: 103 mg/dL — ABNORMAL HIGH (ref 65–99)
Potassium: 4.8 mmol/L (ref 3.5–5.2)
Sodium: 143 mmol/L (ref 134–144)
Total Protein: 7.1 g/dL (ref 6.0–8.5)

## 2020-03-18 LAB — HEMOGLOBIN A1C
Est. average glucose Bld gHb Est-mCnc: 160 mg/dL
Hgb A1c MFr Bld: 7.2 % — ABNORMAL HIGH (ref 4.8–5.6)

## 2020-03-18 LAB — LIPID PANEL
Chol/HDL Ratio: 2.4 ratio (ref 0.0–5.0)
Cholesterol, Total: 131 mg/dL (ref 100–199)
HDL: 54 mg/dL (ref >39)
LDL Chol Calc (NIH): 65 mg/dL (ref 0–99)
Triglycerides: 54 mg/dL (ref 0–149)
VLDL Cholesterol Cal: 12 mg/dL (ref 5–40)

## 2020-03-18 LAB — INSULIN, RANDOM: INSULIN: 10.1 u[IU]/mL (ref 2.6–24.9)

## 2020-03-18 LAB — VITAMIN D 25 HYDROXY (VIT D DEFICIENCY, FRACTURES): Vit D, 25-Hydroxy: 31.6 ng/mL (ref 30.0–100.0)

## 2020-03-18 NOTE — Telephone Encounter (Signed)
Dr Opalski, please advise  

## 2020-03-18 NOTE — Progress Notes (Addendum)
Chief Complaint:   OBESITY Calvin Chandler is here to discuss his progress with his obesity treatment plan along with follow-up of his obesity related diagnoses. Calvin Chandler is on the Category 2 Plan and states he is following his eating plan approximately 99% of the time. Calvin Chandler states he is walking 28 minutes 6 times per week.  Today's visit was #: 6 Starting weight: 245 lbs Starting date: 12/27/2019 Today's weight: 223 lbs Today's date: 03/17/2020 Total lbs lost to date: 22 lbs Total lbs lost since last in-office visit: 3 lbs Total weight loss percentage to date: -8.98%  Interim History: Calvin Chandler is a [employer]'s [employer]. He just celebrated his 51st wedding anniversary with his wife, and they had a big celebration with family. He went to [employer] with his 2 sons and had drinks. He also increased his walking as per our goal at his last OV to approximately 30 minutes 6 days a week.   Assessment/Plan:   Orders Placed This Encounter  Procedures  . Comprehensive metabolic panel  . Insulin, random  . Hemoglobin A1c  . Lipid panel  . VITAMIN D 25 Hydroxy (Vit-D Deficiency, Fractures)   Meds ordered this encounter  Medications  . Vitamin D, Ergocalciferol, (DRISDOL) 1.25 MG (50000 UNIT) CAPS capsule    Sig: Take 1 capsule (50,000 Units total) by mouth every 7 (seven) days.    Dispense:  4 capsule    Refill:  0      1. Type 2 diabetes mellitus with other specified complication, without long-term current use of insulin (HCC) Calvin Chandler is on Rybelsus and Glipizide. He reports his fasting blood sugars are 100-110 consistently. He denies lows and is tolerating medication well.  Medications reviewed. Diabetic ROS: no polyuria or polydipsia, no chest pain, dyspnea or TIA's, no numbness, tingling or pain in extremities.   Lab Results  Component Value Date   HGBA1C 7.2 (H) 03/17/2020   HGBA1C 12.8 (H) 11/29/2019   HGBA1C 7.8 (H) 03/07/2017   Lab Results  Component Value Date   MICROALBUR  0.9 11/29/2019   LDLCALC 65 03/17/2020   CREATININE 1.07 03/17/2020   Lab Results  Component Value Date   INSULIN 10.1 03/17/2020   Plan: check A1c and fasting insulin today. Good blood sugar control is important to decrease the likelihood of diabetic complications such as nephropathy, neuropathy, limb loss, blindness, coronary artery disease, and death. Intensive lifestyle modification including diet, exercise and weight loss are the first line of treatment for diabetes.    Orders - Insulin, random - Hemoglobin A1c  2. Hyperlipidemia associated with type 2 diabetes mellitus Calvin Chandler) Calvin Chandler's endocrinologist started him on Lipitor on 01/10/2020. He is tolerating it well with no side effects.  Calvin Chandler has hyperlipidemia and has been trying to improve his cholesterol levels with intensive lifestyle modification including a low saturated fat diet, exercise and weight loss. He denies any chest pain, claudication or myalgias.  Lab Results  Component Value Date   ALT 15 03/17/2020   AST 22 03/17/2020   ALKPHOS 77 03/17/2020   BILITOT 0.7 03/17/2020   Lab Results  Component Value Date   CHOL 131 03/17/2020   HDL 54 03/17/2020   LDLCALC 65 03/17/2020   LDLDIRECT 114.4 08/24/2006   TRIG 54 03/17/2020   CHOLHDL 2.4 03/17/2020   Plan: Recheck fasting lipid panel and CMP today. Cardiovascular risk and specific lipid/LDL goals reviewed.  We discussed several lifestyle modifications today and Calvin Chandler will continue to work on diet, exercise and weight  loss efforts. Orders and follow up as documented in patient record.   Counseling Intensive lifestyle modifications are the first line treatment for this issue. . Dietary changes: Increase soluble fiber. Decrease simple carbohydrates. . Exercise changes: Moderate to vigorous-intensity aerobic activity 150 minutes per week if tolerated. . Lipid-lowering medications: see documented in medical record.  Orders - Comprehensive metabolic panel - Lipid  panel  3. Vitamin D deficiency Calvin Chandler's Vitamin D level was 19.19 on 11/29/2019. He is currently taking prescription vitamin D 50,000 IU each week. He denies nausea, vomiting or muscle weakness.  Plan: Refill Vit D for 1 month, as per below. Recheck Vit D level. Low Vitamin D level contributes to fatigue and are associated with obesity, breast, and colon cancer. He agrees to continue to take prescription Vitamin D @50 ,000 IU every week and will follow-up for routine testing of Vitamin D, at least 2-3 times per year to avoid over-replacement.  Refill- Vitamin D, Ergocalciferol, (DRISDOL) 1.25 MG (50000 UNIT) CAPS capsule; Take 1 capsule (50,000 Units total) by mouth every 7 (seven) days.  Dispense: 4 capsule; Refill: 0  - VITAMIN D 25 Hydroxy (Vit-D Deficiency, Fractures)  4. Other constipation Calvin Chandler reports constipation is much improved from prior OV. He is using OTC smooth move.  Plan: Still use Miralax prn and get adequate hydration.  5. At risk for hypoglycemia Calvin Chandler was given approximately 10 minutes of counseling today regarding prevention of hypoglycemia. He was advised of symptoms of hypoglycemia. Calvin Chandler was instructed to avoid skipping meals, and to eat regular protein-rich meals as prescribed on the meal plan. Have readily available- low calorie snacks as needed ie- [employer] fruit snack packs if BS goes too low.   6. Class 1 obesity with serious comorbidity and body mass index (BMI) of 33.0 to 33.9 in adult, unspecified obesity type Calvin Chandler is currently in the action stage of change. As such, his goal is to continue with weight loss efforts. He has agreed to the Category 2 Plan.   Exercise goals: As is  Behavioral modification strategies: increasing water intake and celebration eating strategies.  Calvin Chandler has agreed to follow-up with our clinic in 2 weeks. He was informed of the importance of frequent follow-up visits to maximize his success with intensive lifestyle modifications for  his multiple health conditions.   Calvin Chandler was informed we would discuss his lab results at his next visit unless there is a critical issue that needs to be addressed sooner. Calvin Chandler agreed to keep his next visit at the agreed upon time to discuss these results.  Objective:   Blood pressure (!) 96/58, pulse 74, temperature 98.1 F (36.7 C), height 5\' 9"  (1.753 m), weight 223 lb (101.2 kg), SpO2 98 %. Body mass index is 32.93 kg/m.  General: Cooperative, alert, well developed, in no acute distress. HEENT: Conjunctivae and lids unremarkable. Cardiovascular: Regular rhythm.  Lungs: Normal work of breathing. Neurologic: No focal deficits.   Lab Results  Component Value Date   CREATININE 1.07 03/17/2020   BUN 16 03/17/2020   NA 143 03/17/2020   K 4.8 03/17/2020   CL 105 03/17/2020   CO2 22 03/17/2020   Lab Results  Component Value Date   ALT 15 03/17/2020   AST 22 03/17/2020   ALKPHOS 77 03/17/2020   BILITOT 0.7 03/17/2020   Lab Results  Component Value Date   HGBA1C 7.2 (H) 03/17/2020   HGBA1C 12.8 (H) 11/29/2019   HGBA1C 7.8 (H) 03/07/2017   HGBA1C 7.4 (H) 08/25/2015   HGBA1C  7.0 (H) 01/27/2015   Lab Results  Component Value Date   INSULIN 10.1 03/17/2020   Lab Results  Component Value Date   TSH 1.110 12/27/2019   Lab Results  Component Value Date   CHOL 131 03/17/2020   HDL 54 03/17/2020   LDLCALC 65 03/17/2020   LDLDIRECT 114.4 08/24/2006   TRIG 54 03/17/2020   CHOLHDL 2.4 03/17/2020   Lab Results  Component Value Date   WBC 3.5 (L) 01/27/2015   HGB 14.4 01/27/2015   HCT 43.7 01/27/2015   MCV 85.9 01/27/2015   PLT 234.0 01/27/2015    Attestation Statements:   Reviewed by clinician on day of visit: allergies, medications, problem list, medical history, surgical history, family history, social history, and previous encounter notes.  Coral Ceo, am acting as Location manager for [employer], DO.  I have reviewed the above documentation  for accuracy and completeness, and I agree with the above. Marjory Sneddon, D.O.  The [place] was signed into law in 2016 which includes the topic of electronic health records.  This provides immediate access to information in MyChart.  This includes consultation notes, operative notes, office notes, lab results and pathology reports.  If you have any questions about what you read please let us know at your next visit so we can discuss your concerns and take corrective action if need be.  We are right here with you.

## 2020-03-18 NOTE — Telephone Encounter (Signed)
Pt is asking for a letter to give to [place] [employer].   Needs letter with A1C level on it.

## 2020-03-19 ENCOUNTER — Encounter (INDEPENDENT_AMBULATORY_CARE_PROVIDER_SITE_OTHER): Payer: Self-pay

## 2020-03-24 ENCOUNTER — Other Ambulatory Visit (INDEPENDENT_AMBULATORY_CARE_PROVIDER_SITE_OTHER): Payer: Self-pay | Admitting: Family Medicine

## 2020-03-24 DIAGNOSIS — E1169 Type 2 diabetes mellitus with other specified complication: Secondary | ICD-10-CM

## 2020-03-25 ENCOUNTER — Ambulatory Visit (INDEPENDENT_AMBULATORY_CARE_PROVIDER_SITE_OTHER): Payer: BC Managed Care – PPO | Admitting: Internal Medicine

## 2020-03-25 ENCOUNTER — Encounter: Payer: Self-pay | Admitting: Internal Medicine

## 2020-03-25 ENCOUNTER — Other Ambulatory Visit: Payer: Self-pay

## 2020-03-25 VITALS — BP 110/70 | HR 82 | Ht 69.0 in | Wt 225.5 lb

## 2020-03-25 DIAGNOSIS — E785 Hyperlipidemia, unspecified: Secondary | ICD-10-CM | POA: Diagnosis not present

## 2020-03-25 DIAGNOSIS — E119 Type 2 diabetes mellitus without complications: Secondary | ICD-10-CM | POA: Diagnosis not present

## 2020-03-25 NOTE — Patient Instructions (Signed)
-   Continue Glipizide 10 mg, 1 tablet before breakfast  - Continue Rybelsus 3 mg daily with Breakfast  - Continue Atorvastatin 10 mg daily   - HOW TO TREAT LOW BLOOD SUGARS (Blood sugar LESS THAN 70 MG/DL)  Please follow the RULE OF 15 for the treatment of hypoglycemia treatment (when your (blood sugars are less than 70 mg/dL)    STEP 1: Take 15 grams of carbohydrates when your blood sugar is low, which includes:   3-4 GLUCOSE TABS  OR  3-4 OZ OF JUICE OR REGULAR SODA OR  ONE TUBE OF GLUCOSE GEL     STEP 2: RECHECK blood sugar in 15 MINUTES STEP 3: If your blood sugar is still low at the 15 minute recheck --> then, go back to STEP 1 and treat AGAIN with another 15 grams of carbohydrates.

## 2020-03-25 NOTE — Progress Notes (Signed)
Name: Calvin Chandler  Age/ Sex: 74 y.o., male   MRN/ DOB: [id number], December 19, 1945     PCP: Ann Held, DO   Reason for Endocrinology Evaluation: Type 2 Diabetes Mellitus  Initial Endocrine Consultative Visit: 01/01/2020    PATIENT IDENTIFIER: Calvin Chandler is a 74 y.o. male with a past medical history of T2DM, OSA and dyslipidemia. The patient has followed with Endocrinology clinic since 01/01/2020 for consultative assistance with management of his diabetes.  DIABETIC HISTORY:  Calvin Chandler was diagnosed with DM in 2008. Metformin ha caused diarrhea.  His hemoglobin A1c has ranged from 6.7% in 2015, peaking at 12.8% in 2021. He follows with the [facility]  He is a school bus driver for [employer]    On his initial visit to our clinic, he had an A1c of 12.8 % he was not on any glycemic agents. He declined insulin and opted for oral glycemic agents. We started Glipizide and Rybelsus   SUBJECTIVE:   During the last visit (01/01/2020): A1c 12.8 % started Glipizide and Rybelsus   Today (03/25/2020): Calvin Chandler is here for a follow up on diabetes management.  He checks his blood sugars 1 times daily.The patient has not had hypoglycemic episodes since the last clinic visit.  He denies any nausea or vomiting      HOME ENDOCRINE REGIMEN:  Glipizide 10 mg , 1 tablet BID- daily  Rybelsus 3 mg daily  Atorvastatin 10 mg daily    Statin: Yes  ACE-I/ARB: no    METER DOWNLOAD SUMMARY:  99- 131 mg/dL     DIABETIC COMPLICATIONS: Microvascular complications:   Denies: CKD, neuropathy, retinopathy  Last Eye Exam: Completed 04/2019  Macrovascular complications:    Denies: CAD, CVA, PVD   HISTORY:  Past Medical History:  Past Medical History:  Diagnosis Date  . Anxiety   . Arthritis   . Depression   . Diabetes mellitus without complication ([place])   . Hyperlipidemia   . OSA (obstructive sleep apnea) 01/11/2018   Past Surgical History:   Past Surgical History:  Procedure Laterality Date  . COLONOSCOPY    . NO PAST SURGERIES    . WISDOM TOOTH EXTRACTION      Social History:  reports that he quit smoking about 39 years ago. His smoking use included cigarettes. He has a 39.00 pack-year smoking history. He has never used smokeless tobacco. He reports current alcohol use. He reports that he does not use drugs. Family History:  Family History  Problem Relation Age of Onset  . Prostate cancer Father   . Pulmonary embolism Father   . Transient ischemic attack Sister   . Cancer Sister        lymph node cancer  . Heart disease Mother   . Heart failure Mother   . Hypertension Other   . Stroke Other   . Clotting disorder Other        Thromboembolism clotting disorder  . Colon cancer Neg Hx   . Stomach cancer Neg Hx   . Rectal cancer Neg Hx   . Pancreatic cancer Neg Hx   . Esophageal cancer Neg Hx   . Liver cancer Neg Hx      HOME MEDICATIONS: Allergies as of 03/25/2020      Reactions   Metformin And Related Diarrhea      Medication List       Accurate as of March 25, 2020 11:23 AM. If you have any questions, ask  your nurse or doctor.        Accu-Chek Guide test strip Generic drug: glucose blood Use 2-4 times daily as needed   Accu-Chek Guide w/Device Kit 1 Device by Does not apply route as directed.   Accu-Chek Softclix Lancet Dev Kit 1 Device by Does not apply route as directed.   Accu-Chek Softclix Lancets lancets 1 each by Other route as directed. Use as instructed   atorvastatin 10 MG tablet Commonly known as: LIPITOR Take 1 tablet (10 mg total) by mouth daily.   glipiZIDE 2.5 MG 24 hr tablet Commonly known as: GLUCOTROL XL Take 1 tablet (2.5 mg total) by mouth daily with breakfast. (24hr tab)   polyethylene glycol 17 g packet Commonly known as: MiraLax Take 17 g by mouth daily. prn constipation   Rybelsus 3 MG Tabs Generic drug: Semaglutide Take 3 mg by mouth daily.   Vitamin D  (Ergocalciferol) 1.25 MG (50000 UNIT) Caps capsule Commonly known as: DRISDOL Take 1 capsule (50,000 Units total) by mouth every 7 (seven) days.        OBJECTIVE:   Vital Signs: BP 110/70   Pulse 82   Ht 5' 9"  (1.753 m)   Wt 225 lb 8 oz (102.3 kg)   SpO2 98%   BMI 33.30 kg/m   Wt Readings from Last 3 Encounters:  03/25/20 225 lb 8 oz (102.3 kg)  03/17/20 223 lb (101.2 kg)  02/27/20 226 lb (102.5 kg)     Exam: General: Pt appears well and is in NAD  Neck: General: Supple without adenopathy. Thyroid: Thyroid size normal.  No goiter or nodules appreciated.   Lungs: Clear with good BS bilat with no rales, rhonchi, or wheezes  Heart: RRR with normal S1 and S2 and no gallops; no murmurs; no rub  Abdomen: Normoactive bowel sounds, soft, nontender, without masses or organomegaly palpable  Extremities: No pretibial edema.  Neuro: MS is good with appropriate affect, pt is alert and Ox3    DM foot exam: 03/25/2020   The skin of the feet is  without sores or ulcerations. The pedal pulses are 2+ on right and 2+ on left. The sensation is intact to a screening 5.07, 10 gram monofilament bilaterally          DATA REVIEWED:  Lab Results  Component Value Date   HGBA1C 7.2 (H) 03/17/2020   HGBA1C 12.8 (H) 11/29/2019   HGBA1C 7.8 (H) 03/07/2017   Lab Results  Component Value Date   MICROALBUR 0.9 11/29/2019   LDLCALC 65 03/17/2020   CREATININE 1.07 03/17/2020   Lab Results  Component Value Date   MICRALBCREAT 0.6 11/29/2019     Lab Results  Component Value Date   CHOL 131 03/17/2020   HDL 54 03/17/2020   LDLCALC 65 03/17/2020   LDLDIRECT 114.4 08/24/2006   TRIG 54 03/17/2020   CHOLHDL 2.4 03/17/2020         ASSESSMENT / PLAN / RECOMMENDATIONS:   1) Type 2 Diabetes Mellitus, Optimally controlled, Without complications - Most recent A1c of 7.2 %. Goal A1c < 7.0 %.    - A1c at 7.2 % down from 12.8 %  - I have congratulated the pt on the dramatic improvement  in glycemic control and > 20 lbs weight loss in the just 3 months  - He is tolerating his medications without side effects     MEDICATIONS: - Continue Glipizide 10 mg, 1 tablet before Breakfast  - Continue Rybelsus 3 mg , 1 tablet  before Breakfast   EDUCATION / INSTRUCTIONS:  BG monitoring instructions: Patient is instructed to check his blood sugars 1 times a day, fasting .  Call [facility] if: BG persistently < 70  . I reviewed the Rule of 15 for the treatment of hypoglycemia in detail with the patient. Literature supplied.     2) Diabetic complications:   Eye: Does not have known diabetic retinopathy.   Neuro/ Feet: Does not have known diabetic peripheral neuropathy .   Renal: Patient does not have known baseline CKD. He   is not on an ACEI/ARB at present.      3) Dyslipidemia:   - He is tolerating Atorvastatin well  - LDL went from 124 mg/dL to 65 mg/dL  - No change today    Medication Continue Atorvastatin 10 mg daily    F/U in 4 months    Signed electronically by: Mack Guise, MD  [facility] End[facility]  [address] [place], [place] [address] Phone: [phone] FAX: [phone]   CC: Ann Held, DO [address] [place] [address] Phone: [phone]  Fax: [phone]  Return to Endocrinology clinic as below: Future Appointments  Date Time Provider [place]  04/01/2020  3:00 PM Mellody Dance, DO [facility] None

## 2020-03-31 ENCOUNTER — Telehealth (INDEPENDENT_AMBULATORY_CARE_PROVIDER_SITE_OTHER): Payer: Self-pay | Admitting: Family Medicine

## 2020-03-31 NOTE — Telephone Encounter (Signed)
Left message for patient to call clinic back, but to bring in Glipizide bottle with him to see who the prescribing doctor is on the bottle.

## 2020-03-31 NOTE — Telephone Encounter (Signed)
Gliteside 2.5 was picked up from the pharmacy and it is wrong.  He would like get it corrected so he doesn't have to cut pills in half.    He has never received the Rybelus refill and has three left.  Please give Calvin Chandler a call and let him know what his next step will be.

## 2020-04-01 ENCOUNTER — Encounter (INDEPENDENT_AMBULATORY_CARE_PROVIDER_SITE_OTHER): Payer: Self-pay | Admitting: Family Medicine

## 2020-04-01 ENCOUNTER — Ambulatory Visit (INDEPENDENT_AMBULATORY_CARE_PROVIDER_SITE_OTHER): Payer: BC Managed Care – PPO | Admitting: Family Medicine

## 2020-04-01 ENCOUNTER — Other Ambulatory Visit: Payer: Self-pay

## 2020-04-01 VITALS — BP 103/68 | HR 82 | Temp 97.7°F | Ht 69.0 in | Wt 219.0 lb

## 2020-04-01 DIAGNOSIS — E669 Obesity, unspecified: Secondary | ICD-10-CM

## 2020-04-01 DIAGNOSIS — E1169 Type 2 diabetes mellitus with other specified complication: Secondary | ICD-10-CM

## 2020-04-01 DIAGNOSIS — E785 Hyperlipidemia, unspecified: Secondary | ICD-10-CM

## 2020-04-01 DIAGNOSIS — E559 Vitamin D deficiency, unspecified: Secondary | ICD-10-CM

## 2020-04-01 DIAGNOSIS — Z6832 Body mass index (BMI) 32.0-32.9, adult: Secondary | ICD-10-CM

## 2020-04-01 MED ORDER — GLIPIZIDE ER 2.5 MG PO TB24: 2.5000 mg | ORAL_TABLET | Freq: Every day | ORAL | 0 refills | Status: DC

## 2020-04-01 MED ORDER — VITAMIN D (ERGOCALCIFEROL) 1.25 MG (50000 UNIT) PO CAPS
ORAL_CAPSULE | ORAL | 0 refills | Status: DC
Start: 2020-04-01 — End: 2020-04-22

## 2020-04-01 MED ORDER — RYBELSUS 3 MG PO TABS: 3.0000 mg | ORAL_TABLET | Freq: Every day | ORAL | 0 refills | Status: DC

## 2020-04-02 NOTE — Progress Notes (Addendum)
Chief Complaint:   OBESITY Calvin Chandler is here to discuss his progress with his obesity treatment plan along with follow-up of his obesity related diagnoses. Calvin Chandler is on the Category 2 Plan and states he is following his eating plan approximately 99% of the time. Calvin Chandler states he is doing leg lifts and working his stomach 25-30 minutes 5 times per week.  Today's visit was #: 7 Starting weight: 245 lbs Starting date: 12/27/2019 Today's weight: 219 lbs Today's date: 04/01/2020 Total lbs lost to date: 26 lbs Total lbs lost since last in-office visit: 4 lbs Total weight loss percentage to date: -10.61%  Interim History: Calvin Chandler reports he is doing great, his sugars are great, and he if felling great like "[employer]". He denies issues with the plan and is here to discuss labs that were done 03/17/2020. Knowledge denies hunger or cravings.  Assessment/Plan:   Meds ordered this encounter  Medications  . glipiZIDE (GLUCOTROL XL) 2.5 MG 24 hr tablet    Sig: Take 1 tablet (2.5 mg total) by mouth daily with breakfast. (24hr tab)    Dispense:  30 tablet    Refill:  0  . Semaglutide (RYBELSUS) 3 MG TABS    Sig: Take 3 mg by mouth daily.    Dispense:  30 tablet    Refill:  0  . Vitamin D, Ergocalciferol, (DRISDOL) 1.25 MG (50000 UNIT) CAPS capsule    Sig: 1 po q wed and 1 po q sun    Dispense:  8 capsule    Refill:  0    1. Type 2 diabetes mellitus with other specified complication, without long-term current use of insulin ([place]) Discussed labs with patient today. Calvin Chandler's A1c went from 12.8 on 11/2019 to now 7.2! He reports fasting blood sugars from 87-131 but mostly 100-110. He feels great and has no issues. Patient states he needs a refill of his DM medications and requests that I refill them today. He was recently seen by endocrinology, and I reviewed notes from his OV with Dr. Kelton Pillar on 03/25/2020.  Lab Results  Component Value Date   HGBA1C 7.2 (H) 03/17/2020   HGBA1C 12.8 (H)  11/29/2019   HGBA1C 7.8 (H) 03/07/2017   Lab Results  Component Value Date   MICROALBUR 0.9 11/29/2019   LDLCALC 65 03/17/2020   CREATININE 1.07 03/17/2020   Lab Results  Component Value Date   INSULIN 10.1 03/17/2020   Plan: Refilled glipizide and Rybelsus today for 1 month, as per below. Continue prudent nutritional plan, weight loss, exercise, and close blood sugar monitoring at home. Hypoglycemia prevention and management discussed with patient.   2. Vitamin D deficiency Discussed labs with patient today. Calvin Chandler's Vitamin D level was 31.6 on 03/17/2020. He is currently taking prescription vitamin D 50,000 IU each week. He denies nausea, vomiting or muscle weakness.   Ref. Range 03/17/2020 11:11  Vitamin D, 25-Hydroxy Latest Ref Range: 30.0 - 100.0 ng/mL 31.6   Plan: Increase Vit D to twice weekly and recheck Vit D level and A1c in 3 months. New prescription given. Low Vitamin D level contributes to fatigue and are associated with obesity, breast, and colon cancer. He agrees to continue to take prescription Vitamin D @50 ,000 IU every week and will follow-up for routine testing of Vitamin D, at least 2-3 times per year to avoid over-replacement.  Refill with increased dose- Vitamin D, Ergocalciferol, (DRISDOL) 1.25 MG (50000 UNIT) CAPS capsule; 1 po q wed and 1 po q sun  Dispense: 8 capsule; Refill: 0  3. Hyperlipidemia associated with type 2 diabetes mellitus ([place]) Discussed labs with patient today.  Calvin Chandler was started on Lipitor 01/10/2020 and is tolerating it well without issues. Repeat labs showed great improvement. Calvin Chandler has hyperlipidemia and has been trying to improve his cholesterol levels with intensive lifestyle modification including a low saturated fat diet, exercise and weight loss. He denies any chest pain, claudication or myalgias.  Lab Results  Component Value Date   ALT 15 03/17/2020   AST 22 03/17/2020   ALKPHOS 77 03/17/2020   BILITOT 0.7 03/17/2020   Lab  Results  Component Value Date   CHOL 131 03/17/2020   HDL 54 03/17/2020   LDLCALC 65 03/17/2020   LDLDIRECT 114.4 08/24/2006   TRIG 54 03/17/2020   CHOLHDL 2.4 03/17/2020   Lab Results  Component Value Date   NA 143 03/17/2020   K 4.8 03/17/2020   CO2 22 03/17/2020   GLUCOSE 103 (H) 03/17/2020   BUN 16 03/17/2020   CREATININE 1.07 03/17/2020   CALCIUM 9.1 03/17/2020   GFRNONAA 69 03/17/2020   GFRAA 80 03/17/2020   Plan: Calvin Chandler LDL is at goal. Continue Lipitor at current dose. Continue to increase exercise and follow prudent nutritional plan and weight loss.  4. Class 1 obesity with serious comorbidity and body mass index (BMI) of 32.0 to 32.9 in adult, unspecified obesity type Calvin Chandler is currently in the action stage of change. As such, his goal is to continue with weight loss efforts. He has agreed to the Category 2 Plan.   Exercise goals: As is  Behavioral modification strategies: increasing water intake, ways to avoid boredom eating, avoiding temptations and planning for success.  Calvin Chandler has agreed to follow-up with our clinic in 2-2.5 weeks. He was informed of the importance of frequent follow-up visits to maximize his success with intensive lifestyle modifications for his multiple health conditions.   Objective:   Blood pressure 103/68, pulse 82, temperature 97.7 F (36.5 C), height 5\' 9"  (1.753 m), weight 219 lb (99.3 kg), SpO2 97 %. Body mass index is 32.34 kg/m.  General: Cooperative, alert, well developed, in no acute distress. HEENT: Conjunctivae and lids unremarkable. Cardiovascular: Regular rhythm.  Lungs: Normal work of breathing. Neurologic: No focal deficits.   Lab Results  Component Value Date   CREATININE 1.07 03/17/2020   BUN 16 03/17/2020   NA 143 03/17/2020   K 4.8 03/17/2020   CL 105 03/17/2020   CO2 22 03/17/2020   Lab Results  Component Value Date   ALT 15 03/17/2020   AST 22 03/17/2020   ALKPHOS 77 03/17/2020   BILITOT 0.7 03/17/2020    Lab Results  Component Value Date   HGBA1C 7.2 (H) 03/17/2020   HGBA1C 12.8 (H) 11/29/2019   HGBA1C 7.8 (H) 03/07/2017   HGBA1C 7.4 (H) 08/25/2015   HGBA1C 7.0 (H) 01/27/2015   Lab Results  Component Value Date   INSULIN 10.1 03/17/2020   Lab Results  Component Value Date   TSH 1.110 12/27/2019   Lab Results  Component Value Date   CHOL 131 03/17/2020   HDL 54 03/17/2020   LDLCALC 65 03/17/2020   LDLDIRECT 114.4 08/24/2006   TRIG 54 03/17/2020   CHOLHDL 2.4 03/17/2020   Lab Results  Component Value Date   WBC 3.5 (L) 01/27/2015   HGB 14.4 01/27/2015   HCT 43.7 01/27/2015   MCV 85.9 01/27/2015   PLT 234.0 01/27/2015    Obesity Behavioral Intervention:  Approximately 15 minutes were spent on the discussion below.  ASK: We discussed the diagnosis of obesity with Calvin Chandler today and Calvin Chandler agreed to give [place] permission to discuss obesity behavioral modification therapy today.  ASSESS: Calvin Chandler has the diagnosis of obesity and his BMI today is 32.4. Calvin Chandler is in the action stage of change.   ADVISE: Calvin Chandler was educated on the multiple health risks of obesity as well as the benefit of weight loss to improve his health. He was advised of the need for long term treatment and the importance of lifestyle modifications to improve his current health and to decrease his risk of future health problems.  AGREE: Multiple dietary modification options and treatment options were discussed and Calvin Chandler agreed to follow the recommendations documented in the above note.  ARRANGE: Calvin Chandler was educated on the importance of frequent visits to treat obesity as outlined per CMS and USPSTF guidelines and agreed to schedule his next follow up appointment today.  Attestation Statements:   Reviewed by clinician on day of visit: allergies, medications, problem list, medical history, surgical history, family history, social history, and previous encounter notes.  Coral Ceo, am acting as  Location manager for [employer], DO.  I have reviewed the above documentation for accuracy and completeness, and I agree with the above. Marjory Sneddon, D.O.  The [place] was signed into law in 2016 which includes the topic of electronic health records.  This provides immediate access to information in MyChart.  This includes consultation notes, operative notes, office notes, lab results and pathology reports.  If you have any questions about what you read please let us know at your next visit so we can discuss your concerns and take corrective action if need be.  We are right here with you.

## 2020-04-22 ENCOUNTER — Other Ambulatory Visit: Payer: Self-pay

## 2020-04-22 ENCOUNTER — Telehealth (INDEPENDENT_AMBULATORY_CARE_PROVIDER_SITE_OTHER): Payer: MEDICARE | Admitting: Family Medicine

## 2020-04-22 ENCOUNTER — Encounter (INDEPENDENT_AMBULATORY_CARE_PROVIDER_SITE_OTHER): Payer: Self-pay | Admitting: Family Medicine

## 2020-04-22 ENCOUNTER — Encounter (INDEPENDENT_AMBULATORY_CARE_PROVIDER_SITE_OTHER): Payer: Self-pay

## 2020-04-22 ENCOUNTER — Telehealth (INDEPENDENT_AMBULATORY_CARE_PROVIDER_SITE_OTHER): Payer: Self-pay

## 2020-04-22 DIAGNOSIS — E1169 Type 2 diabetes mellitus with other specified complication: Secondary | ICD-10-CM

## 2020-04-22 DIAGNOSIS — E559 Vitamin D deficiency, unspecified: Secondary | ICD-10-CM

## 2020-04-22 DIAGNOSIS — Z6832 Body mass index (BMI) 32.0-32.9, adult: Secondary | ICD-10-CM

## 2020-04-22 DIAGNOSIS — E669 Obesity, unspecified: Secondary | ICD-10-CM

## 2020-04-22 DIAGNOSIS — K5909 Other constipation: Secondary | ICD-10-CM

## 2020-04-22 MED ORDER — GLIPIZIDE ER 2.5 MG PO TB24
2.5000 mg | ORAL_TABLET | Freq: Every day | ORAL | 0 refills | Status: DC
Start: 2020-04-22 — End: 2020-05-21

## 2020-04-22 MED ORDER — RYBELSUS 3 MG PO TABS: 3.0000 mg | ORAL_TABLET | Freq: Every day | ORAL | 0 refills | Status: DC

## 2020-04-22 MED ORDER — VITAMIN D (ERGOCALCIFEROL) 1.25 MG (50000 UNIT) PO CAPS: ORAL_CAPSULE | ORAL | 0 refills | Status: DC

## 2020-04-22 NOTE — Telephone Encounter (Signed)
Verbal consent obtained to conduct video visit, via telehealth 

## 2020-04-24 NOTE — Progress Notes (Signed)
TeleHealth Visit:  Due to the COVID-19 pandemic, this visit was completed with telemedicine (audio/video) technology to reduce patient and provider exposure as well as to preserve personal protective equipment.   Calvin Chandler has verbally consented to this TeleHealth visit. The patient is located at home, the provider is located at the [facility] office. The participants in this visit include the listed provider and patient. The visit was conducted today via video.   Chief Complaint: OBESITY Calvin Chandler is here to discuss his progress with his obesity treatment plan along with follow-up of his obesity related diagnoses. Calvin Chandler is on the Category 2 Plan and states he is following his eating plan approximately 99% of the time. Calvin Chandler states he is exercising 0 minutes 0 times per week.  Today's visit was #: 8 Starting weight: 245 lbs Starting date: 12/27/2019  Interim History:  Calvin Chandler was last seen 04/01/2020. He reports runny nose and occasional cough for 4-5 days now.  - He was recently exposed to [place] by a close family member.   He denies fever and chills.   He denies SOB or difficulty breathing.  - He had a negative COVID test and is feeling slightly better using OTC meds.    Calvin Chandler denies issues or concerns with meal plan. He is tolerating it well.    Assessment/Plan:   1. Type 2 diabetes mellitus with other specified complication, without long-term current use of insulin ([place]) Montee sees Dr. Sarina Ser at endocrinology. He reports fasting blood sugars 85-100's. He is prescribed Rybelsus 3 mg and Glipizide. Tol well w/o s-e.  Lab Results  Component Value Date   HGBA1C 7.2 (H) 03/17/2020   HGBA1C 12.8 (H) 11/29/2019   HGBA1C 7.8 (H) 03/07/2017   Lab Results  Component Value Date   INSULIN 10.1 03/17/2020   Lab Results  Component Value Date   MICROALBUR 0.9 11/29/2019   LDLCALC 65 03/17/2020   CREATININE 1.07 03/17/2020    Plan: - Counseled patient.  Good  blood sugar control is important to decrease the likelihood of diabetic complications such as nephropathy, neuropathy, limb loss, blindness, coronary artery disease, and death.  - Intensive lifestyle modification including diet, exercise and weight loss are the first line of treatment for diabetes.    - Continue home blood sugar monitoring - closely with continued wt loss.  Reviewed blood sugar goals.  Reminded if pt feels poorly- check BS and BP at that time.  Bring in BS and BP logs each OV. - Eat on a regular basis- no skipping or going long periods without eating.  We discussed hypoglycemia prevention. - Any concerns about medicines should be directed at the prescribing provider - Recheck labs in 3 months if not done at Endo/ PCP.  - Importance of f/up with PCP and all other specialists as scheduled was stressed to pt today Refill Rybelsus and Glipizide, for 1 month supplies, as per below.  Refill- Semaglutide (RYBELSUS) 3 MG TABS; Take 3 mg by mouth daily.  Dispense: 30 tablet; Refill: 0 Refill- glipiZIDE (GLUCOTROL XL) 2.5 MG 24 hr tablet; Take 1 tablet (2.5 mg total) by mouth daily with breakfast. (24hr tab)  Dispense: 30 tablet; Refill: 0   2. Vitamin D deficiency Calvin Chandler's Vitamin D level was 31.6 on 03/17/2020. He is currently taking prescription vitamin D 50,000 IU twice a week. We doubled his medication and he is tolerating it well. He denies nausea, vomiting or muscle weakness.   Ref. Range 03/17/2020 11:11  Vitamin D, 25-Hydroxy  Latest Ref Range: 30.0 - 100.0 ng/mL 31.6   Plan: Refill Vit D for 1 month, as per below. Low Vitamin D level contributes to fatigue and are associated with obesity, breast, and colon cancer. He agrees to continue to take prescription Vitamin D @50 ,000 IU every week and will follow-up for routine testing of Vitamin D, at least 2-3 times per year to avoid over-replacement.  Refill- Vitamin D, Ergocalciferol, (DRISDOL) 1.25 MG (50000 UNIT) CAPS capsule; 1 po q wed  and 1 po q sun  Dispense: 8 capsule; Refill: 0   3. Other constipation In recent past, we gave Calvin Chandler a Miralax prescription. He denies current issues.   Plan: Calvin Chandler's constipation is stable. Cont current txmnt plan   4. Class 1 obesity with serious comorbidity and body mass index (BMI) of 32.0 to 32.9 in adult, unspecified obesity type Calvin Chandler is currently in the action stage of change. As such, his goal is to continue with weight loss efforts. He has agreed to the Category 2 Plan.   Exercise goals: Start aerobic activity- something is better than nothing- QD. Older adults should follow the adult guidelines. When older adults cannot meet the adult guidelines, they should be as physically active as their abilities and conditions will allow.   Behavioral modification strategies: increasing lean protein intake, decreasing simple carbohydrates, meal planning and cooking strategies, keeping healthy foods in the home and planning for success.  Calvin Chandler has agreed to follow-up with our clinic in 2 weeks. He was informed of the importance of frequent follow-up visits to maximize his success with intensive lifestyle modifications for his multiple health conditions.  Objective:   VITALS: Per patient if applicable, see vitals. GENERAL: Alert and in no acute distress. CARDIOPULMONARY: No increased WOB. Speaking in clear sentences.  PSYCH: Pleasant and cooperative. Speech normal rate and rhythm. Affect is appropriate. Insight and judgement are appropriate. Attention is focused, linear, and appropriate.  NEURO: Oriented as arrived to appointment on time with no prompting.   Lab Results  Component Value Date   CREATININE 1.07 03/17/2020   BUN 16 03/17/2020   NA 143 03/17/2020   K 4.8 03/17/2020   CL 105 03/17/2020   CO2 22 03/17/2020   Lab Results  Component Value Date   ALT 15 03/17/2020   AST 22 03/17/2020   ALKPHOS 77 03/17/2020   BILITOT 0.7 03/17/2020   Lab Results  Component Value  Date   HGBA1C 7.2 (H) 03/17/2020   HGBA1C 12.8 (H) 11/29/2019   HGBA1C 7.8 (H) 03/07/2017   HGBA1C 7.4 (H) 08/25/2015   HGBA1C 7.0 (H) 01/27/2015   Lab Results  Component Value Date   INSULIN 10.1 03/17/2020   Lab Results  Component Value Date   TSH 1.110 12/27/2019   Lab Results  Component Value Date   CHOL 131 03/17/2020   HDL 54 03/17/2020   LDLCALC 65 03/17/2020   LDLDIRECT 114.4 08/24/2006   TRIG 54 03/17/2020   CHOLHDL 2.4 03/17/2020   Lab Results  Component Value Date   WBC 3.5 (L) 01/27/2015   HGB 14.4 01/27/2015   HCT 43.7 01/27/2015   MCV 85.9 01/27/2015   PLT 234.0 01/27/2015   No results found for: IRON, TIBC, FERRITIN  Attestation Statements:   Reviewed by clinician on day of visit: allergies, medications, problem list, medical history, surgical history, family history, social history, and previous encounter notes.  Coral Ceo, am acting as Location manager for [employer], DO.  I have reviewed the above documentation for  accuracy and completeness, and I agree with the above. Marjory Sneddon, D.O.  The [place] was signed into law in 2016 which includes the topic of electronic health records.  This provides immediate access to information in MyChart.  This includes consultation notes, operative notes, office notes, lab results and pathology reports.  If you have any questions about what you read please let us know at your next visit so we can discuss your concerns and take corrective action if need be.  We are right here with you.

## 2020-05-06 ENCOUNTER — Encounter (INDEPENDENT_AMBULATORY_CARE_PROVIDER_SITE_OTHER): Payer: Self-pay | Admitting: Family Medicine

## 2020-05-06 ENCOUNTER — Other Ambulatory Visit: Payer: Self-pay

## 2020-05-06 ENCOUNTER — Ambulatory Visit (INDEPENDENT_AMBULATORY_CARE_PROVIDER_SITE_OTHER): Payer: MEDICARE | Admitting: Family Medicine

## 2020-05-06 VITALS — BP 105/68 | HR 91 | Temp 97.4°F | Ht 69.0 in | Wt 219.0 lb

## 2020-05-06 DIAGNOSIS — E1169 Type 2 diabetes mellitus with other specified complication: Secondary | ICD-10-CM

## 2020-05-06 DIAGNOSIS — Z6832 Body mass index (BMI) 32.0-32.9, adult: Secondary | ICD-10-CM

## 2020-05-06 DIAGNOSIS — E669 Obesity, unspecified: Secondary | ICD-10-CM

## 2020-05-06 DIAGNOSIS — E559 Vitamin D deficiency, unspecified: Secondary | ICD-10-CM

## 2020-05-07 NOTE — Progress Notes (Signed)
Chief Complaint:   OBESITY Calvin Chandler is here to discuss his progress with his obesity treatment plan along with follow-up of his obesity related diagnoses. Calvin Chandler is on the Category 2 Plan and states he is following his eating plan approximately 85% of the time. Calvin Chandler states he is exercising 0 minutes 0 times per week.  Today's visit was #: 9 Starting weight: 245 lbs Starting date: 12/27/2019 Today's weight: 219 lbs Today's date: 05/06/2020 Total lbs lost to date: 26 lbs Total lbs lost since last in-office visit: 0 lbs Total weight loss percentage to date: -10.61%  Interim History: Last visit was via video. Pt had family in town for 3 weeks over the holidays and early January. Pt did very little slices of cheesecake or other little treats but otherwise ate on plan. He did not exercise the last couple of weeks.   Assessment/Plan:   1. Vitamin D deficiency Calvin Chandler's Vitamin D level was 31.6 on 03/17/2020. He is currently taking prescription vitamin D 50,000 IU twice a week, as we increased dose on 04/01/2020. He denies nausea, vomiting or muscle weakness.  Ref. Range 03/17/2020 11:11  Vitamin D, 25-Hydroxy Latest Ref Range: 30.0 - 100.0 ng/mL 31.6   Plan: Continue current treatment plan and recheck in late March. Low Vitamin D level contributes to fatigue and are associated with obesity, breast, and colon cancer. He agrees to continue to take prescription Vitamin D @50 ,000 IU every week and will follow-up for routine testing of Vitamin D, at least 2-3 times per year to avoid over-replacement.  2. Type 2 diabetes mellitus with other specified complication, without long-term current use of insulin ([facility]) Pt is on Rybelsus and Glipizide and tolerating them well. Most recent A1c 7.2. FBS 90-100's. He reports no lows or highs.  Medications reviewed. Diabetic ROS: no polyuria or polydipsia, no chest pain, dyspnea or TIA's, no numbness, tingling or pain in extremities.   Lab Results  Component  Value Date   HGBA1C 7.2 (H) 03/17/2020   HGBA1C 12.8 (H) 11/29/2019   HGBA1C 7.8 (H) 03/07/2017   Lab Results  Component Value Date   MICROALBUR 0.9 11/29/2019   LDLCALC 65 03/17/2020   CREATININE 1.07 03/17/2020   Lab Results  Component Value Date   INSULIN 10.1 03/17/2020    Plan: BS stable. Continue current treatment plan as directed. Continue home blood sugar monitoring.  3. Class 1 obesity with serious comorbidity and body mass index (BMI) of 32.0 to 32.9 in adult, unspecified obesity type Calvin Chandler is currently in the action stage of change. As such, his goal is to continue with weight loss efforts. He has agreed to the Category 2 Plan.   Exercise goals: As is for now. In the futrue, will look to increase activity.  Behavioral modification strategies: celebration eating strategies, avoiding temptations and planning for success.  Calvin Chandler has agreed to follow-up with our clinic in 2 weeks. He was informed of the importance of frequent follow-up visits to maximize his success with intensive lifestyle modifications for his multiple health conditions.   Objective:   Blood pressure 105/68, pulse 91, temperature (!) 97.4 F (36.3 C), height 5\' 9"  (1.753 m), weight 219 lb (99.3 kg), SpO2 98 %. Body mass index is 32.34 kg/m.  General: Cooperative, alert, well developed, in no acute distress. HEENT: Conjunctivae and lids unremarkable. Cardiovascular: Regular rhythm.  Lungs: Normal work of breathing. Neurologic: No focal deficits.   Lab Results  Component Value Date   CREATININE 1.07 03/17/2020  BUN 16 03/17/2020   NA 143 03/17/2020   K 4.8 03/17/2020   CL 105 03/17/2020   CO2 22 03/17/2020   Lab Results  Component Value Date   ALT 15 03/17/2020   AST 22 03/17/2020   ALKPHOS 77 03/17/2020   BILITOT 0.7 03/17/2020   Lab Results  Component Value Date   HGBA1C 7.2 (H) 03/17/2020   HGBA1C 12.8 (H) 11/29/2019   HGBA1C 7.8 (H) 03/07/2017   HGBA1C 7.4 (H) 08/25/2015    HGBA1C 7.0 (H) 01/27/2015   Lab Results  Component Value Date   INSULIN 10.1 03/17/2020   Lab Results  Component Value Date   TSH 1.110 12/27/2019   Lab Results  Component Value Date   CHOL 131 03/17/2020   HDL 54 03/17/2020   LDLCALC 65 03/17/2020   LDLDIRECT 114.4 08/24/2006   TRIG 54 03/17/2020   CHOLHDL 2.4 03/17/2020   Lab Results  Component Value Date   WBC 3.5 (L) 01/27/2015   HGB 14.4 01/27/2015   HCT 43.7 01/27/2015   MCV 85.9 01/27/2015   PLT 234.0 01/27/2015   No results found for: IRON, TIBC, FERRITIN  Obesity Behavioral Intervention:   Approximately 15 minutes were spent on the discussion below.  ASK: We discussed the diagnosis of obesity with Calvin Chandler today and Calvin Chandler agreed to give [place] permission to discuss obesity behavioral modification therapy today.  ASSESS: Calvin Chandler has the diagnosis of obesity and his BMI today is 32.4. Calvin Chandler is in the action stage of change.   ADVISE: Calvin Chandler was educated on the multiple health risks of obesity as well as the benefit of weight loss to improve his health. He was advised of the need for long term treatment and the importance of lifestyle modifications to improve his current health and to decrease his risk of future health problems.  AGREE: Multiple dietary modification options and treatment options were discussed and Calvin Chandler agreed to follow the recommendations documented in the above note.  ARRANGE: Calvin Chandler was educated on the importance of frequent visits to treat obesity as outlined per CMS and USPSTF guidelines and agreed to schedule his next follow up appointment today.  Attestation Statements:   Reviewed by clinician on day of visit: allergies, medications, problem list, medical history, surgical history, family history, social history, and previous encounter notes.  Coral Ceo, am acting as Location manager for [employer], DO.  I have reviewed the above documentation for accuracy and completeness,  and I agree with the above. Marjory Sneddon, D.O.  The [place] was signed into law in 2016 which includes the topic of electronic health records.  This provides immediate access to information in MyChart.  This includes consultation notes, operative notes, office notes, lab results and pathology reports.  If you have any questions about what you read please let us know at your next visit so we can discuss your concerns and take corrective action if need be.  We are right here with you.

## 2020-05-20 ENCOUNTER — Ambulatory Visit (INDEPENDENT_AMBULATORY_CARE_PROVIDER_SITE_OTHER): Payer: MEDICARE | Admitting: Family Medicine

## 2020-05-20 ENCOUNTER — Encounter (INDEPENDENT_AMBULATORY_CARE_PROVIDER_SITE_OTHER): Payer: Self-pay | Admitting: Family Medicine

## 2020-05-21 ENCOUNTER — Ambulatory Visit (INDEPENDENT_AMBULATORY_CARE_PROVIDER_SITE_OTHER): Payer: MEDICARE | Admitting: Family Medicine

## 2020-05-21 ENCOUNTER — Other Ambulatory Visit: Payer: Self-pay

## 2020-05-21 ENCOUNTER — Encounter (INDEPENDENT_AMBULATORY_CARE_PROVIDER_SITE_OTHER): Payer: Self-pay | Admitting: Family Medicine

## 2020-05-21 VITALS — BP 105/70 | HR 79 | Temp 97.4°F | Ht 69.0 in | Wt 216.0 lb

## 2020-05-21 DIAGNOSIS — Z6832 Body mass index (BMI) 32.0-32.9, adult: Secondary | ICD-10-CM

## 2020-05-21 DIAGNOSIS — E1169 Type 2 diabetes mellitus with other specified complication: Secondary | ICD-10-CM

## 2020-05-21 DIAGNOSIS — E559 Vitamin D deficiency, unspecified: Secondary | ICD-10-CM

## 2020-05-21 DIAGNOSIS — E669 Obesity, unspecified: Secondary | ICD-10-CM

## 2020-05-21 MED ORDER — VITAMIN D (ERGOCALCIFEROL) 1.25 MG (50000 UNIT) PO CAPS
ORAL_CAPSULE | ORAL | 0 refills | Status: DC
Start: 2020-05-21 — End: 2020-06-11

## 2020-05-21 MED ORDER — RYBELSUS 3 MG PO TABS: 3.0000 mg | ORAL_TABLET | Freq: Every day | ORAL | 0 refills | Status: DC

## 2020-05-21 MED ORDER — GLIPIZIDE ER 2.5 MG PO TB24: 2.5000 mg | ORAL_TABLET | Freq: Every day | ORAL | 0 refills | Status: DC

## 2020-05-26 NOTE — Progress Notes (Signed)
Chief Complaint:   OBESITY Placido is here to discuss his progress with his obesity treatment plan along with follow-up of his obesity related diagnoses.   Today's visit was #: 10 Starting weight: 245 lbs Starting date: 12/27/2019 Today's weight: 216 lbs Today's date: 05/21/2020 Total lbs lost to date: 29 lbs Body mass index is 31.9 kg/m.  Total weight loss percentage to date: -11.84%  Interim History:  LISTON THUM is here for a follow up office visit.  he is following the meal plan without concern or issues.  Patient's meal and food recall appears to be accurate and consistent with what is on the plan.  When on plan, his hunger and cravings are well controlled.    Plan:  Increase activity.  Nutrition Plan: Category 2 Plan for 85% of the time. Activity: None at this time.  Assessment/Plan:   1. Type 2 diabetes mellitus with other specified complication, without long-term current use of insulin (HCC) Diabetes Mellitus: Not at goal. Medication: Glucotrol XL 2.5 mg daily, Rybelsus 3 mg daily.  FBS:  Denies lows.  On average, 99-104.  Denies concerns with medications.  Tolerating well.  Plan:  Will refill Glucotrol XL and Rybelsus, as per below. The importance of regular follow up with PCP and all other specialists as scheduled was stressed to patient today.  Will recheck A1c in early to mid March.  Lab Results  Component Value Date   HGBA1C 7.2 (H) 03/17/2020   HGBA1C 12.8 (H) 11/29/2019   HGBA1C 7.8 (H) 03/07/2017   Lab Results  Component Value Date   MICROALBUR 0.9 11/29/2019   LDLCALC 65 03/17/2020   CREATININE 1.07 03/17/2020   - Refill glipiZIDE (GLUCOTROL XL) 2.5 MG 24 hr tablet; Take 1 tablet (2.5 mg total) by mouth daily with breakfast. (24hr tab)  Dispense: 30 tablet; Refill: 0 - Refill Semaglutide (RYBELSUS) 3 MG TABS; Take 3 mg by mouth daily.  Dispense: 30 tablet; Refill: 0 - Hemoglobin A1c  2. Vitamin D deficiency Not at goal. Current vitamin D is 31.6,  tested on 03/17/2020. Optimal goal > 50 ng/dL.   Plan: Continue to take prescription Vitamin D @50 ,000 IU every week as prescribed.  Follow-up for routine testing of Vitamin D, at least 2-3 times per year to avoid over-replacement.  - Refill Vitamin D, Ergocalciferol, (DRISDOL) 1.25 MG (50000 UNIT) CAPS capsule; 1 po q wed and 1 po q sun  Dispense: 8 capsule; Refill: 0  3. Class 1 obesity with serious comorbidity and body mass index (BMI) of 32.0 to 32.9 in adult, unspecified obesity type  Course: Mykale is currently in the action stage of change. As such, his goal is to continue with weight loss efforts.   Nutrition goals: He has agreed to the Category 2 Plan.   Exercise goals: All adults should avoid inactivity. Some physical activity is better than none, and adults who participate in any amount of physical activity gain some health benefits.  Behavioral modification strategies: increasing lean protein intake, decreasing simple carbohydrates, meal planning and cooking strategies, keeping healthy foods in the home and planning for success.  Kwan has agreed to follow-up with our clinic in 2 weeks. He was informed of the importance of frequent follow-up visits to maximize his success with intensive lifestyle modifications for his multiple health conditions.   Objective:   Blood pressure 105/70, pulse 79, temperature (!) 97.4 F (36.3 C), height 5\' 9"  (1.753 m), weight 216 lb (98 kg), SpO2 98 %. Body  mass index is 31.9 kg/m.  General: Cooperative, alert, well developed, in no acute distress. HEENT: Conjunctivae and lids unremarkable. Cardiovascular: Regular rhythm.  Lungs: Normal work of breathing. Neurologic: No focal deficits.   Lab Results  Component Value Date   CREATININE 1.07 03/17/2020   BUN 16 03/17/2020   NA 143 03/17/2020   K 4.8 03/17/2020   CL 105 03/17/2020   CO2 22 03/17/2020   Lab Results  Component Value Date   ALT 15 03/17/2020   AST 22 03/17/2020   ALKPHOS  77 03/17/2020   BILITOT 0.7 03/17/2020   Lab Results  Component Value Date   HGBA1C 7.2 (H) 03/17/2020   HGBA1C 12.8 (H) 11/29/2019   HGBA1C 7.8 (H) 03/07/2017   HGBA1C 7.4 (H) 08/25/2015   HGBA1C 7.0 (H) 01/27/2015   Lab Results  Component Value Date   INSULIN 10.1 03/17/2020   Lab Results  Component Value Date   TSH 1.110 12/27/2019   Lab Results  Component Value Date   CHOL 131 03/17/2020   HDL 54 03/17/2020   LDLCALC 65 03/17/2020   LDLDIRECT 114.4 08/24/2006   TRIG 54 03/17/2020   CHOLHDL 2.4 03/17/2020   Lab Results  Component Value Date   WBC 3.5 (L) 01/27/2015   HGB 14.4 01/27/2015   HCT 43.7 01/27/2015   MCV 85.9 01/27/2015   PLT 234.0 01/27/2015   Obesity Behavioral Intervention:   Approximately 15 minutes were spent on the discussion below.  ASK: We discussed the diagnosis of obesity with Escher today and Vaun agreed to give [place] permission to discuss obesity behavioral modification therapy today.  ASSESS: Brylin has the diagnosis of obesity and his BMI today is 32.0. Sequan is in the action stage of change.   ADVISE: Shenouda was educated on the multiple health risks of obesity as well as the benefit of weight loss to improve his health. He was advised of the need for long term treatment and the importance of lifestyle modifications to improve his current health and to decrease his risk of future health problems.  AGREE: Multiple dietary modification options and treatment options were discussed and Jettie agreed to follow the recommendations documented in the above note.  ARRANGE: Jakoby was educated on the importance of frequent visits to treat obesity as outlined per CMS and USPSTF guidelines and agreed to schedule his next follow up appointment today.  Attestation Statements:   Reviewed by clinician on day of visit: allergies, medications, problem list, medical history, surgical history, family history, social history, and previous encounter  notes.  I, Water quality scientist, CMA, am acting as Location manager for [employer], DO.  I have reviewed the above documentation for accuracy and completeness, and I agree with the above. Marjory Sneddon, D.O.  The [place] was signed into law in 2016 which includes the topic of electronic health records.  This provides immediate access to information in MyChart.  This includes consultation notes, operative notes, office notes, lab results and pathology reports.  If you have any questions about what you read please let us know at your next visit so we can discuss your concerns and take corrective action if need be.  We are right here with you.

## 2020-06-11 ENCOUNTER — Encounter (INDEPENDENT_AMBULATORY_CARE_PROVIDER_SITE_OTHER): Payer: Self-pay | Admitting: Family Medicine

## 2020-06-11 ENCOUNTER — Telehealth (INDEPENDENT_AMBULATORY_CARE_PROVIDER_SITE_OTHER): Payer: MEDICARE | Admitting: Family Medicine

## 2020-06-11 ENCOUNTER — Other Ambulatory Visit: Payer: Self-pay

## 2020-06-11 VITALS — Ht 69.0 in | Wt 214.0 lb

## 2020-06-11 DIAGNOSIS — E559 Vitamin D deficiency, unspecified: Secondary | ICD-10-CM

## 2020-06-11 DIAGNOSIS — E669 Obesity, unspecified: Secondary | ICD-10-CM

## 2020-06-11 DIAGNOSIS — Z6831 Body mass index (BMI) 31.0-31.9, adult: Secondary | ICD-10-CM

## 2020-06-11 DIAGNOSIS — E1169 Type 2 diabetes mellitus with other specified complication: Secondary | ICD-10-CM

## 2020-06-11 MED ORDER — RYBELSUS 3 MG PO TABS: 3.0000 mg | ORAL_TABLET | Freq: Every day | ORAL | 0 refills | Status: DC

## 2020-06-11 MED ORDER — GLIPIZIDE ER 2.5 MG PO TB24: 2.5000 mg | ORAL_TABLET | Freq: Every day | ORAL | 0 refills | Status: DC

## 2020-06-11 MED ORDER — VITAMIN D (ERGOCALCIFEROL) 1.25 MG (50000 UNIT) PO CAPS
ORAL_CAPSULE | ORAL | 0 refills | Status: DC
Start: 2020-06-11 — End: 2020-07-01

## 2020-06-12 NOTE — Progress Notes (Signed)
TeleHealth Visit:  Due to the COVID-19 pandemic, this visit was completed with telemedicine (audio/video) technology to reduce patient and provider exposure as well as to preserve personal protective equipment.   Calvin Chandler has verbally consented to this TeleHealth visit. The patient is located at home, the provider is located at the [facility] office. The participants in this visit include the listed provider and patient and. The visit was conducted today via [place].  OBESITY Calvin Chandler is here to discuss his progress with his obesity treatment plan along with follow-up of his obesity related diagnoses.   Today's visit was #: 11 Starting weight: 245 lbs Starting date: 12/27/2019 Today's date: 06/11/2020  Interim History:  Calvin Chandler is here for a follow up office visit.  he is following the meal plan without concern or issues.  Patient's meal and food recall appears to be accurate and consistent with what is on the plan.  When on plan, his hunger and cravings are well controlled.    Current Meal Plan: the Category 2 Plan for 92% of the time.  Current Exercise Plan: Bowflex for 20 minutes 3 times per week.  Assessment/Plan:   1. Type 2 diabetes mellitus with other specified complication, without long-term current use of insulin (HCC) Diabetes Mellitus: Not at goal. Medication: Rybelsus 3 mg daily and glipizide 2.5 mg daily. Issues reviewed: blood sugar goals, complications of diabetes mellitus, hypoglycemia prevention and treatment, exercise, and nutrition.  FBS 89-100.  Plan: The importance of regular follow up with PCP and all other specialists as scheduled was stressed to patient today.  Lab Results  Component Value Date   HGBA1C 7.2 (H) 03/17/2020   HGBA1C 12.8 (H) 11/29/2019   HGBA1C 7.8 (H) 03/07/2017   Lab Results  Component Value Date   MICROALBUR 0.9 11/29/2019   LDLCALC 65 03/17/2020   CREATININE 1.07 03/17/2020   - Refill glipiZIDE (GLUCOTROL XL)  2.5 MG 24 hr tablet; Take 1 tablet (2.5 mg total) by mouth daily with breakfast. (24hr tab)  Dispense: 30 tablet; Refill: 0 - Refill Semaglutide (RYBELSUS) 3 MG TABS; Take 3 mg by mouth daily.  Dispense: 30 tablet; Refill: 0  2. Vitamin D deficiency Not at goal. Current vitamin D is 31.6, tested on 03/17/2020. Optimal goal > 50 ng/dL.  He is taking vitamin D 50,000 IU daily.  Plan: Continue to take prescription Vitamin D @50 ,000 IU every week as prescribed.  Follow-up for routine testing of Vitamin D, at least 2-3 times per year to avoid over-replacement.  - Refill Vitamin D, Ergocalciferol, (DRISDOL) 1.25 MG (50000 UNIT) CAPS capsule; 1 po q wed and 1 po q sun  Dispense: 8 capsule; Refill: 0  3. Class 1 obesity with serious comorbidity and body mass index (BMI) of 31.0 to 31.9 in adult, unspecified obesity type  Course: Calvin Chandler is currently in the action stage of change. As such, his goal is to continue with weight loss efforts.   Nutrition goals: He has agreed to the Category 2 Plan.   Exercise goals: As is.  Behavioral modification strategies: increasing lean protein intake, decreasing simple carbohydrates, meal planning and cooking strategies and planning for success.  Calvin Chandler has agreed to follow-up with our clinic in 3 weeks. He was informed of the importance of frequent follow-up visits to maximize his success with intensive lifestyle modifications for his multiple health conditions.   Objective:   Height 5\' 9"  (1.753 m), weight 214 lb (97.1 kg). Body mass index is 31.6  kg/m.  General: Cooperative, alert, well developed, in no acute distress. HEENT: Conjunctivae and lids unremarkable. Cardiovascular: Regular rhythm.  Lungs: Normal work of breathing. Neurologic: No focal deficits.   Lab Results  Component Value Date   CREATININE 1.07 03/17/2020   BUN 16 03/17/2020   NA 143 03/17/2020   K 4.8 03/17/2020   CL 105 03/17/2020   CO2 22 03/17/2020   Lab Results  Component  Value Date   ALT 15 03/17/2020   AST 22 03/17/2020   ALKPHOS 77 03/17/2020   BILITOT 0.7 03/17/2020   Lab Results  Component Value Date   HGBA1C 7.2 (H) 03/17/2020   HGBA1C 12.8 (H) 11/29/2019   HGBA1C 7.8 (H) 03/07/2017   HGBA1C 7.4 (H) 08/25/2015   HGBA1C 7.0 (H) 01/27/2015   Lab Results  Component Value Date   INSULIN 10.1 03/17/2020   Lab Results  Component Value Date   TSH 1.110 12/27/2019   Lab Results  Component Value Date   CHOL 131 03/17/2020   HDL 54 03/17/2020   LDLCALC 65 03/17/2020   LDLDIRECT 114.4 08/24/2006   TRIG 54 03/17/2020   CHOLHDL 2.4 03/17/2020   Lab Results  Component Value Date   WBC 3.5 (L) 01/27/2015   HGB 14.4 01/27/2015   HCT 43.7 01/27/2015   MCV 85.9 01/27/2015   PLT 234.0 01/27/2015   Attestation Statements:   Reviewed by clinician on day of visit: allergies, medications, problem list, medical history, surgical history, family history, social history, and previous encounter notes.  I, Water quality scientist, CMA, am acting as Location manager for [employer], DO.  I have reviewed the above documentation for accuracy and completeness, and I agree with the above. Marjory Sneddon, D.O.  The [place] was signed into law in 2016 which includes the topic of electronic health records.  This provides immediate access to information in MyChart.  This includes consultation notes, operative notes, office notes, lab results and pathology reports.  If you have any questions about what you read please let us know at your next visit so we can discuss your concerns and take corrective action if need be.  We are right here with you.

## 2020-06-21 ENCOUNTER — Other Ambulatory Visit: Payer: Self-pay | Admitting: Internal Medicine

## 2020-07-01 ENCOUNTER — Telehealth (INDEPENDENT_AMBULATORY_CARE_PROVIDER_SITE_OTHER): Payer: MEDICARE | Admitting: Family Medicine

## 2020-07-01 ENCOUNTER — Encounter (INDEPENDENT_AMBULATORY_CARE_PROVIDER_SITE_OTHER): Payer: Self-pay | Admitting: Family Medicine

## 2020-07-01 ENCOUNTER — Other Ambulatory Visit: Payer: Self-pay

## 2020-07-01 VITALS — Wt 216.0 lb

## 2020-07-01 DIAGNOSIS — Z6836 Body mass index (BMI) 36.0-36.9, adult: Secondary | ICD-10-CM

## 2020-07-01 DIAGNOSIS — E1169 Type 2 diabetes mellitus with other specified complication: Secondary | ICD-10-CM

## 2020-07-01 DIAGNOSIS — E559 Vitamin D deficiency, unspecified: Secondary | ICD-10-CM

## 2020-07-01 DIAGNOSIS — K5909 Other constipation: Secondary | ICD-10-CM

## 2020-07-01 MED ORDER — RYBELSUS 7 MG PO TABS: ORAL_TABLET | ORAL | 0 refills | Status: DC

## 2020-07-01 MED ORDER — VITAMIN D (ERGOCALCIFEROL) 1.25 MG (50000 UNIT) PO CAPS: ORAL_CAPSULE | ORAL | 0 refills | Status: DC

## 2020-07-08 NOTE — Progress Notes (Signed)
TeleHealth Visit:  Due to the COVID-19 pandemic, this visit was completed with telemedicine (audio/video) technology to reduce patient and provider exposure as well as to preserve personal protective equipment.   Calvin Chandler has verbally consented to this TeleHealth visit. The patient is located at home, the provider is located at the [facility] office. The participants in this visit include the listed provider and patient and. The visit was conducted today via [place].  OBESITY Calvin Chandler is here to discuss his progress with his obesity treatment plan along with follow-up of his obesity related diagnoses.   Today's visit was #: 12 Starting weight: 245 lbs Starting date: 12/27/2019 Today's date: 07/01/2020  Interim History:  Calvin Chandler has had cold symptoms for the past couple of days.  Positive cough, no fever/chills, no GI symptoms.  He has been eating out a little more as his daughter is in town visiting.  Otherwise, "I like the plan".  Denies issues or concerns.  Current Meal Plan: the Category 2 Plan for 75% of the time.  Current Exercise Plan: Walking for 25 minutes 3 times per week. Current Anti-Obesity Medications: Rybelsus 3 mg daily. Side effects: None.  Assessment/Plan:   No orders of the defined types were placed in this encounter.   Medications Discontinued During This Encounter  Medication Reason  . Semaglutide (RYBELSUS) 3 MG TABS Dose change  . Vitamin D, Ergocalciferol, (DRISDOL) 1.25 MG (50000 UNIT) CAPS capsule Reorder     Meds ordered this encounter  Medications  . Vitamin D, Ergocalciferol, (DRISDOL) 1.25 MG (50000 UNIT) CAPS capsule    Sig: 1 po q wed and 1 po q sun    Dispense:  8 capsule    Refill:  0  . Semaglutide (RYBELSUS) 7 MG TABS    Sig: One po qd    Dispense:  30 tablet    Refill:  0     1. Type 2 diabetes mellitus with other specified complication, without long-term current use of insulin (HCC) Diabetes Mellitus: Not at goal.  Medication: glipizide 10 mg daily, Rybelsus 3 mg daily. Issues reviewed: blood sugar goals, complications of diabetes mellitus, hypoglycemia prevention and treatment, exercise, and nutrition.   Plan: Increase Rybelsus from 3 mg to 7 mg daily, as per below.  Continue glipizide.  Hypoglycemia counseling done today. The importance of regular follow up with PCP and all other specialists as scheduled was stressed to patient today.  Lab Results  Component Value Date   HGBA1C 7.2 (H) 03/17/2020   HGBA1C 12.8 (H) 11/29/2019   HGBA1C 7.8 (H) 03/07/2017   Lab Results  Component Value Date   MICROALBUR 0.9 11/29/2019   LDLCALC 65 03/17/2020   CREATININE 1.07 03/17/2020   - Increase Semaglutide (RYBELSUS) 7 MG TABS; One po qd  Dispense: 30 tablet; Refill: 0  2. Vitamin D deficiency Not at goal. Current vitamin D is 31.6, tested on 03/17/2020. Optimal goal > 50 ng/dL.  She is taking vitamin D 50,000 IU twice weekly.  Plan: Continue to take prescription Vitamin D @50 ,000 IU twice weekly as prescribed.  Follow-up for routine testing of Vitamin D, at least 2-3 times per year to avoid over-replacement.  - Reifill Vitamin D, Ergocalciferol, (DRISDOL) 1.25 MG (50000 UNIT) CAPS capsule; 1 po q wed and 1 po q sun  Dispense: 8 capsule; Refill: 0  3. Other constipation This problem is controlled.  He has increased his water intake.  Drinking (4) 16 ounce glasses of water per day now.  No issues with constipation and only uses MiraLax occasionally now.  Plan:  Continue prudent nutritional plan, proper hydration, and increase fiber intake.  Calvin Chandler was informed that a decrease in bowel movement frequency is normal while losing weight, but stools should not be hard or painful.  Counseling: Getting to Good Bowel Health: Your goal is to have one soft bowel movement each day. Drink at least 8 glasses of water each day. Eat plenty of fiber (goal is over 30 grams each day). It is best to get most of your fiber from  dietary sources which includes leafy green vegetables, fresh fruit, and whole grains. You may need to add fiber with the help of OTC fiber supplements. These include Metamucil, Citrucel, and Benefiber. If you are still having trouble, try adding an osmotic laxative such as Miralax. If all of these changes do not work, Cabin crew.   4. Obesity, current BMI 31.90  Course: Calvin Chandler is currently in the action stage of change. As such, his goal is to continue with weight loss efforts.   Nutrition goals: He has agreed to the Category 2 Plan.   Exercise goals: As is.  Increase as tolerated.  Behavioral modification strategies: increasing water intake, increasing high fiber foods, decreasing eating out and planning for success.  Calvin Chandler has agreed to follow-up with our clinic in 2 weeks. He was informed of the importance of frequent follow-up visits to maximize his success with intensive lifestyle modifications for his multiple health conditions.   Objective:   Weight 216 lb (98 kg). Body mass index is 31.9 kg/m.  General: Cooperative, alert, well developed, in no acute distress. HEENT: Conjunctivae and lids unremarkable. Cardiovascular: Regular rhythm.  Lungs: Normal work of breathing. Neurologic: No focal deficits.   Lab Results  Component Value Date   CREATININE 1.07 03/17/2020   BUN 16 03/17/2020   NA 143 03/17/2020   K 4.8 03/17/2020   CL 105 03/17/2020   CO2 22 03/17/2020   Lab Results  Component Value Date   ALT 15 03/17/2020   AST 22 03/17/2020   ALKPHOS 77 03/17/2020   BILITOT 0.7 03/17/2020   Lab Results  Component Value Date   HGBA1C 7.2 (H) 03/17/2020   HGBA1C 12.8 (H) 11/29/2019   HGBA1C 7.8 (H) 03/07/2017   HGBA1C 7.4 (H) 08/25/2015   HGBA1C 7.0 (H) 01/27/2015   Lab Results  Component Value Date   INSULIN 10.1 03/17/2020   Lab Results  Component Value Date   TSH 1.110 12/27/2019   Lab Results  Component Value Date   CHOL 131 03/17/2020   HDL  54 03/17/2020   LDLCALC 65 03/17/2020   LDLDIRECT 114.4 08/24/2006   TRIG 54 03/17/2020   CHOLHDL 2.4 03/17/2020   Lab Results  Component Value Date   WBC 3.5 (L) 01/27/2015   HGB 14.4 01/27/2015   HCT 43.7 01/27/2015   MCV 85.9 01/27/2015   PLT 234.0 01/27/2015   Attestation Statements:   Reviewed by clinician on day of visit: allergies, medications, problem list, medical history, surgical history, family history, social history, and previous encounter notes.  I, Water quality scientist, CMA, am acting as Location manager for [employer], DO.  I have reviewed the above documentation for accuracy and completeness, and I agree with the above. Marjory Sneddon, D.O.  The [place] was signed into law in 2016 which includes the topic of electronic health records.  This provides immediate access to information in MyChart.  This includes  consultation notes, operative notes, office notes, lab results and pathology reports.  If you have any questions about what you read please let us know at your next visit so we can discuss your concerns and take corrective action if need be.  We are right here with you.

## 2020-07-16 ENCOUNTER — Encounter (INDEPENDENT_AMBULATORY_CARE_PROVIDER_SITE_OTHER): Payer: Self-pay

## 2020-07-16 ENCOUNTER — Ambulatory Visit (INDEPENDENT_AMBULATORY_CARE_PROVIDER_SITE_OTHER): Payer: MEDICARE | Admitting: Family Medicine

## 2020-07-16 ENCOUNTER — Other Ambulatory Visit (INDEPENDENT_AMBULATORY_CARE_PROVIDER_SITE_OTHER): Payer: Self-pay | Admitting: Family Medicine

## 2020-07-16 DIAGNOSIS — E1169 Type 2 diabetes mellitus with other specified complication: Secondary | ICD-10-CM

## 2020-07-28 NOTE — Progress Notes (Deleted)
Name: Calvin Chandler  Age/ Sex: 75 y.o., male   MRN/ DOB: [id number], 05/23/1945     PCP: Ann Held, DO   Reason for Endocrinology Evaluation: Type 2 Diabetes Mellitus  Initial Endocrine Consultative Visit: 01/01/2020    PATIENT IDENTIFIER: Calvin Chandler is a 75 y.o. male with a past medical history of T2DM, OSA and dyslipidemia. The patient has followed with Endocrinology clinic since 01/01/2020 for consultative assistance with management of his diabetes.  DIABETIC HISTORY:  Calvin Chandler was diagnosed with DM in 2008. Metformin ha caused diarrhea.  His hemoglobin A1c has ranged from 6.7% in 2015, peaking at 12.8% in 2021. He follows with [facility]  He is a school bus driver for [employer]    On his initial visit to our clinic, he had an A1c of 12.8 % he was not on any glycemic agents. He declined insulin and opted for oral glycemic agents. We started Glipizide and Rybelsus   SUBJECTIVE:   During the last visit (03/25/2020): A1c 7.2 % Continued  Glipizide and Rybelsus   Today (07/29/2020): Calvin Chandler is here for a follow up on diabetes management.  He checks his blood sugars 1 times daily.The patient has not had hypoglycemic episodes since the last clinic visit.  He denies any nausea or vomiting      HOME ENDOCRINE REGIMEN:  Glipizide 10 mg , 1 tablet daily  Rybelsus 3 mg daily  Atorvastatin 10 mg daily    Statin: Yes  ACE-I/ARB: no    METER DOWNLOAD SUMMARY:  99- 131 mg/dL     DIABETIC COMPLICATIONS: Microvascular complications:   Denies: CKD, neuropathy, retinopathy  Last Eye Exam: Completed 04/2019  Macrovascular complications:    Denies: CAD, CVA, PVD   HISTORY:  Past Medical History:  Past Medical History:  Diagnosis Date  . Anxiety   . Arthritis   . Depression   . Diabetes mellitus without complication ([place])   . Hyperlipidemia   . OSA (obstructive sleep apnea) 01/11/2018   Past Surgical History:   Past Surgical History:  Procedure Laterality Date  . COLONOSCOPY    . NO PAST SURGERIES    . WISDOM TOOTH EXTRACTION      Social History:  reports that he quit smoking about 39 years ago. His smoking use included cigarettes. He has a 39.00 pack-year smoking history. He has never used smokeless tobacco. He reports current alcohol use. He reports that he does not use drugs. Family History:  Family History  Problem Relation Age of Onset  . Prostate cancer Father   . Pulmonary embolism Father   . Transient ischemic attack Sister   . Cancer Sister        lymph node cancer  . Heart disease Mother   . Heart failure Mother   . Hypertension Other   . Stroke Other   . Clotting disorder Other        Thromboembolism clotting disorder  . Colon cancer Neg Hx   . Stomach cancer Neg Hx   . Rectal cancer Neg Hx   . Pancreatic cancer Neg Hx   . Esophageal cancer Neg Hx   . Liver cancer Neg Hx      HOME MEDICATIONS: Allergies as of 07/29/2020      Reactions   Metformin And Related Diarrhea      Medication List       Accurate as of July 29, 2020  7:11 AM. If you have any questions,  ask your nurse or doctor.        Accu-Chek Guide test strip Generic drug: glucose blood Use 2-4 times daily as needed   Accu-Chek Guide w/Device Kit 1 Device by Does not apply route as directed.   Accu-Chek Softclix Lancet Dev Kit 1 Device by Does not apply route as directed.   Accu-Chek Softclix Lancets lancets 1 each by Other route as directed. Use as instructed   atorvastatin 10 MG tablet Commonly known as: LIPITOR Take 1 tablet (10 mg total) by mouth daily.   glipiZIDE 10 MG tablet Commonly known as: GLUCOTROL Take 1 tablet (10 mg total) by mouth daily before breakfast.   polyethylene glycol 17 g packet Commonly known as: MiraLax Take 17 g by mouth daily. prn constipation   Rybelsus 7 MG Tabs Generic drug: Semaglutide One po qd   Vitamin D (Ergocalciferol) 1.25 MG (50000 UNIT)  Caps capsule Commonly known as: DRISDOL 1 po q wed and 1 po q sun        OBJECTIVE:   Vital Signs: There were no vitals taken for this visit.  Wt Readings from Last 3 Encounters:  07/01/20 216 lb (98 kg)  06/11/20 214 lb (97.1 kg)  05/21/20 216 lb (98 kg)     Exam: General: Pt appears well and is in NAD  Neck: General: Supple without adenopathy. Thyroid: Thyroid size normal.  No goiter or nodules appreciated.   Lungs: Clear with good BS bilat with no rales, rhonchi, or wheezes  Heart: RRR with normal S1 and S2 and no gallops; no murmurs; no rub  Abdomen: Normoactive bowel sounds, soft, nontender, without masses or organomegaly palpable  Extremities: No pretibial edema.  Neuro: MS is good with appropriate affect, pt is alert and Ox3    DM foot exam: 03/25/2020   The skin of the feet is  without sores or ulcerations. The pedal pulses are 2+ on right and 2+ on left. The sensation is intact to a screening 5.07, 10 gram monofilament bilaterally          DATA REVIEWED:  Lab Results  Component Value Date   HGBA1C 7.2 (H) 03/17/2020   HGBA1C 12.8 (H) 11/29/2019   HGBA1C 7.8 (H) 03/07/2017   Lab Results  Component Value Date   MICROALBUR 0.9 11/29/2019   LDLCALC 65 03/17/2020   CREATININE 1.07 03/17/2020   Lab Results  Component Value Date   MICRALBCREAT 0.6 11/29/2019     Lab Results  Component Value Date   CHOL 131 03/17/2020   HDL 54 03/17/2020   LDLCALC 65 03/17/2020   LDLDIRECT 114.4 08/24/2006   TRIG 54 03/17/2020   CHOLHDL 2.4 03/17/2020         ASSESSMENT / PLAN / RECOMMENDATIONS:   1) Type 2 Diabetes Mellitus, Optimally controlled, Without complications - Most recent A1c of 7.2 %. Goal A1c < 7.0 %.    - A1c at 7.2 % down from 12.8 %  - I have congratulated the pt on the dramatic improvement in glycemic control and > 20 lbs weight loss in the just 3 months  - He is tolerating his medications without side effects     MEDICATIONS: -  Continue Glipizide 10 mg, 1 tablet before Breakfast  - Continue Rybelsus 3 mg , 1 tablet before Breakfast   EDUCATION / INSTRUCTIONS:  BG monitoring instructions: Patient is instructed to check his blood sugars 1 times a day, fasting .  Call [facility] if: BG persistently < 70  .  I reviewed the Rule of 15 for the treatment of hypoglycemia in detail with the patient. Literature supplied.     2) Diabetic complications:   Eye: Does not have known diabetic retinopathy.   Neuro/ Feet: Does not have known diabetic peripheral neuropathy .   Renal: Patient does not have known baseline CKD. He   is not on an ACEI/ARB at present.      3) Dyslipidemia:   - He is tolerating Atorvastatin well  - LDL went from 124 mg/dL to 65 mg/dL  - No change today    Medication Continue Atorvastatin 10 mg daily    F/U in 4 months    Signed electronically by: Mack Guise, MD  [facility]  [facility] [address] [place], [place] [address] Phone: [phone] FAX: [phone]   CC: Ann Held, DO [address] [place] [address] Phone: [phone]  Fax: [phone]  Return to Endocrinology clinic as below: Future Appointments  Date Time Provider [place]  07/29/2020 11:10 AM Rashawnda Gaba, Melanie Crazier, MD [facility] [place]

## 2020-07-29 ENCOUNTER — Ambulatory Visit: Payer: MEDICARE | Admitting: Internal Medicine

## 2020-07-29 ENCOUNTER — Other Ambulatory Visit (INDEPENDENT_AMBULATORY_CARE_PROVIDER_SITE_OTHER): Payer: Self-pay | Admitting: Family Medicine

## 2020-07-29 DIAGNOSIS — Z0289 Encounter for other administrative examinations: Secondary | ICD-10-CM

## 2020-07-29 DIAGNOSIS — E1169 Type 2 diabetes mellitus with other specified complication: Secondary | ICD-10-CM

## 2020-10-30 ENCOUNTER — Other Ambulatory Visit: Payer: MEDICARE

## 2021-01-11 ENCOUNTER — Encounter: Payer: Self-pay | Admitting: Gastroenterology

## 2021-04-24 LAB — HM DIABETES EYE EXAM

## 2021-04-29 ENCOUNTER — Encounter: Payer: Self-pay | Admitting: Family Medicine

## 2021-06-29 ENCOUNTER — Emergency Department (HOSPITAL_BASED_OUTPATIENT_CLINIC_OR_DEPARTMENT_OTHER): Payer: BC Managed Care – PPO

## 2021-06-29 ENCOUNTER — Encounter (HOSPITAL_BASED_OUTPATIENT_CLINIC_OR_DEPARTMENT_OTHER): Payer: Self-pay | Admitting: Emergency Medicine

## 2021-06-29 ENCOUNTER — Other Ambulatory Visit (HOSPITAL_BASED_OUTPATIENT_CLINIC_OR_DEPARTMENT_OTHER): Payer: Self-pay

## 2021-06-29 ENCOUNTER — Ambulatory Visit: Payer: MEDICARE | Admitting: Family Medicine

## 2021-06-29 ENCOUNTER — Ambulatory Visit (HOSPITAL_COMMUNITY)
Admission: EM | Admit: 2021-06-29 | Discharge: 2021-06-29 | Disposition: A | Discharge status: Home / Self Care | Payer: MEDICARE

## 2021-06-29 ENCOUNTER — Emergency Department (HOSPITAL_BASED_OUTPATIENT_CLINIC_OR_DEPARTMENT_OTHER)
Admission: EM | Admit: 2021-06-29 | Discharge: 2021-06-29 | Disposition: A | Discharge status: Home / Self Care | Payer: BC Managed Care – PPO | Attending: Emergency Medicine | Admitting: Emergency Medicine

## 2021-06-29 ENCOUNTER — Other Ambulatory Visit: Payer: Self-pay

## 2021-06-29 DIAGNOSIS — S6991XA Unspecified injury of right wrist, hand and finger(s), initial encounter: Secondary | ICD-10-CM | POA: Diagnosis present

## 2021-06-29 DIAGNOSIS — W230XXA Caught, crushed, jammed, or pinched between moving objects, initial encounter: Secondary | ICD-10-CM | POA: Diagnosis not present

## 2021-06-29 DIAGNOSIS — S62354A Nondisplaced fracture of shaft of fourth metacarpal bone, right hand, initial encounter for closed fracture: Secondary | ICD-10-CM | POA: Diagnosis not present

## 2021-06-29 MED ORDER — HYDROCODONE-ACETAMINOPHEN 5-325 MG PO TABS
1.0000 | ORAL_TABLET | ORAL | 0 refills | Status: DC | PRN
Start: 2021-06-29 — End: 2021-07-01
  Filled 2021-06-29: qty 10, 2d supply, fill #0

## 2021-06-29 NOTE — ED Triage Notes (Signed)
Pt arrives pov with c/o right hand pain after getting "caught in steering wheel today. Swelling and tenderness noted ?

## 2021-06-29 NOTE — Discharge Instructions (Signed)
Keep your splint on.  Keep it clean and dry. ?Can apply ice to the splint and elevate above the level of your heart for 20 minutes at a time to help reduce pain and swelling. ?Norco as needed as prescribed for pain, do not drive or operate machinery while taking Norco.  This medication can cause constipation, managed with over-the-counter medications as needed. ? ?Follow-up with orthopedics, you have been given the phone number for the hand specialist, call and schedule an appointment. ?

## 2021-06-29 NOTE — ED Provider Notes (Signed)
?[facility] EMERGENC[facility] DEPARTMENT ?Provider Note ? ? ?CSN: [id number] ?Arrival date & time: 06/29/21  1203 ? ?  ? ?History ? ?Chief Complaint  ?Patient presents with  ? Hand Injury  ? ? ?Calvin Chandler is a 76 y.o. male. ? ?76 year old right-hand-dominant male presents with complaint of pain and swelling in his right hand.  Patient states the power steering went out on his truck and he injured his hand trying to move the truck off the road.  He reports pain along his right fourth metacarpal as well as right fifth MCP.  No other injuries or concerns. ? ? ?  ? ?Home Medications ?Prior to Admission medications   ?Medication Sig Start Date End Date Taking? Authorizing Provider  ?HYDROcodone-acetaminophen (NORCO/VICODIN) 5-325 MG tablet Take 1 tablet by mouth every 4 (four) hours as needed. 06/29/21  Yes Tacy Learn, PA-C  ?Accu-Chek Softclix Lancets lancets 1 each by Other route as directed. Use as instructed 01/09/20   Shamleffer, Melanie Crazier, MD  ?atorvastatin (LIPITOR) 10 MG tablet Take 1 tablet (10 mg total) by mouth daily. 01/01/20   Shamleffer, Melanie Crazier, MD  ?Blood Glucose Monitoring Suppl (ACCU-CHEK GUIDE) w/Device KIT 1 Device by Does not apply route as directed. 01/01/20   Shamleffer, Melanie Crazier, MD  ?glipiZIDE (GLUCOTROL) 10 MG tablet Take 1 tablet (10 mg total) by mouth daily before breakfast. 06/23/20   Shamleffer, Melanie Crazier, MD  ?glucose blood (ACCU-CHEK GUIDE) test strip Use 2-4 times daily as needed 02/13/20   Mellody Dance, DO  ?Lancets Misc. (ACCU-CHEK SOFTCLIX LANCET DEV) KIT 1 Device by Does not apply route as directed. 01/09/20   Shamleffer, Melanie Crazier, MD  ?polyethylene glycol (MIRALAX) 17 g packet Take 17 g by mouth daily. prn constipation 02/27/20   Mellody Dance, DO  ?Semaglutide (RYBELSUS) 7 MG TABS One po qd 07/01/20   Mellody Dance, DO  ?Vitamin D, Ergocalciferol, (DRISDOL) 1.25 MG (50000 UNIT) CAPS capsule 1 po q wed and 1 po q sun 07/01/20   Mellody Dance, DO  ?   ? ?Allergies    ?Metformin and related   ? ?Review of Systems   ?Review of Systems ?Negative except as per HPI ?Physical Exam ?Updated Vital Signs ?BP (!) 124/91   Pulse 90   Temp 98.1 ?F (36.7 ?C) (Oral)   Resp 18   Ht _0  (1.753 m)   Wt 104.3 kg   SpO2 99%   BMI 33.97 kg/m?  ?Physical Exam ?Vitals and nursing note reviewed.  ?Constitutional:   ?   General: He is not in acute distress. ?   Appearance: He is well-developed. He is not diaphoretic.  ?HENT:  ?   Head: Normocephalic and atraumatic.  ?Cardiovascular:  ?   Pulses: Normal pulses.  ?Pulmonary:  ?   Effort: Pulmonary effort is normal.  ?Musculoskeletal:     ?   General: Swelling, tenderness and signs of injury present. No deformity.  ?   Comments: Swelling and tenderness along the right fourth metacarpal as well as right fifth MCP.  Skin intact, brisk capillary fill present, sensation intact.  No pain in remaining hand and wrist. ?Ring was removed by patient and wife  ?Skin: ?   General: Skin is warm and dry.  ?   Findings: No erythema or rash.  ?Neurological:  ?   Mental Status: He is alert and oriented to person, place, and time.  ?   Sensory: No sensory deficit.  ?   Motor: No  weakness.  ?Psychiatric:     ?   Behavior: Behavior normal.  ? ? ?ED Results / Procedures / Treatments   ?Labs ?(all labs ordered are listed, but only abnormal results are displayed) ?Labs Reviewed - No data to display ? ?EKG ?None ? ?Radiology ?DG Hand Complete Right ? ?Result Date: 06/29/2021 ?CLINICAL DATA:  Patient with right hand pain after getting "caught in steering wheel today. Swelling and tenderness noted across 2nd throughh 5th MCs. EXAM: RIGHT HAND - COMPLETE 3+ VIEW COMPARISON:  None. FINDINGS: There is a mildly displaced spiral fracture involving the shaft of the fourth metacarpal. Possible tiny fracture fragment versus degenerative change at the head of the fifth metacarpal. There are scattered moderate degenerative changes. No evidence of  dislocation. There is lateral hand soft tissue swelling. IMPRESSION: 1. Mildly displaced spiral fracture involving the shaft of the fourth metacarpal. 2. Possible tiny fracture fragment versus degenerative change at the head of the fifth metacarpal. Electronically Signed   By: Audie Pinto M.D.   On: 06/29/2021 13:17   ? ?Procedures ?Procedures  ? ? ?Medications Ordered in ED ?Medications - No data to display ? ?ED Course/ Medical Decision Making/ A&P ?  ?                        ?Medical Decision Making ?Amount and/or Complexity of Data Reviewed ?Radiology: ordered. ? ? ?76 year old male presents with pain in his dominant right hand after trying to turn his steering wheel and hitting his hand into his steering wheel.  He has pain and swelling along the right fourth metacarpal and at the right fifth MCP.  X-ray confirms fracture of the right fourth metacarpal, exam is consistent with concern for fracture of the right fifth MCP.  Patient's ring was removed by patient and his spouse, advised to keep the ring off of this finger due to concerns for swelling of the hand.  He is placed in an ulnar gutter splint and referred to hand for follow-up, given short course of Norco for pain, recommend ice and elevate. ? ?SPLINT APPLICATION ?Date/Time: 3:21 PM ?Authorized by: Tacy Learn ?Consent: Verbal consent obtained. ?Risks and benefits: risks, benefits and alternatives were discussed ?Consent given by: patient ?Splint applied by: orthopedic technician ?Location details: Right hand ?Splint type: Ulnar gutter ?Supplies used: [employer], [employer], [employer] ?Post-procedure: The splinted body part was neurovascularly unchanged following the procedure. ?Patient tolerance: Patient tolerated the procedure well with no immediate complications. ? ? ? ? ? ? ? ? ? ?Final Clinical Impression(s) / ED Diagnoses ?Final diagnoses:  ?Closed nondisplaced fracture of shaft of fourth metacarpal bone of right hand, initial encounter  ? ? ?Rx /  DC Orders ?ED Discharge Orders   ? ?      Ordered  ?  HYDROcodone-acetaminophen (NORCO/VICODIN) 5-325 MG tablet  Every 4 hours PRN       ? 06/29/21 1344  ? ?  ?  ? ?  ? ? ?  ?Tacy Learn, PA-C ?06/29/21 1521 ? ?  ?Gareth Morgan, MD ?07/01/21 2255 ? ?

## 2021-07-01 ENCOUNTER — Other Ambulatory Visit: Payer: Self-pay

## 2021-07-01 ENCOUNTER — Ambulatory Visit (INDEPENDENT_AMBULATORY_CARE_PROVIDER_SITE_OTHER): Payer: BC Managed Care – PPO | Admitting: Plastic Surgery

## 2021-07-01 ENCOUNTER — Encounter: Payer: Self-pay | Admitting: Plastic Surgery

## 2021-07-01 VITALS — BP 130/87 | HR 80 | Ht 69.0 in | Wt 225.0 lb

## 2021-07-01 DIAGNOSIS — S62324A Displaced fracture of shaft of fourth metacarpal bone, right hand, initial encounter for closed fracture: Secondary | ICD-10-CM | POA: Diagnosis not present

## 2021-07-01 MED ORDER — HYDROCODONE-ACETAMINOPHEN 5-325 MG PO TABS: 1.0000 | ORAL_TABLET | Freq: Four times a day (QID) | ORAL | 0 refills | Status: DC | PRN

## 2021-07-01 NOTE — Progress Notes (Signed)
? ?Referring Provider ?Carollee Herter, Kendrick Fries R, DO ?[address] ?[address] ?[place],  [place] [address]  ? ?CC:  ?Chief Complaint  ?Patient presents with  ? Consult  ?   ? ?Calvin Chandler is an 76 y.o. male.  ?HPI: Patient presents as referral from the emergency room for evaluation of a right hand fracture.  It was reportedly caught in the steering wheel and he ended up with some tenderness along the ulnar aspect of his hand and emergency room identified a spiral fracture of the fourth metacarpal that was minimally displaced and a small chip off the head of the fifth metacarpal.  The placement an ulnar gutter splint and sent him here.  He does not have any other injuries. ? ?Allergies  ?Allergen Reactions  ? Metformin And Related Diarrhea  ? ? ?Outpatient Encounter Medications as of 07/01/2021  ?Medication Sig  ? Accu-Chek Softclix Lancets lancets 1 each by Other route as directed. Use as instructed  ? atorvastatin (LIPITOR) 10 MG tablet Take 1 tablet (10 mg total) by mouth daily.  ? Blood Glucose Monitoring Suppl (ACCU-CHEK GUIDE) w/Device KIT 1 Device by Does not apply route as directed.  ? glipiZIDE (GLUCOTROL) 10 MG tablet Take 1 tablet (10 mg total) by mouth daily before breakfast.  ? glucose blood (ACCU-CHEK GUIDE) test strip Use 2-4 times daily as needed  ? HYDROcodone-acetaminophen (NORCO/VICODIN) 5-325 MG tablet Take 1 tablet by mouth every 4 (four) hours as needed.  ? Lancets Misc. (ACCU-CHEK SOFTCLIX LANCET DEV) KIT 1 Device by Does not apply route as directed.  ? polyethylene glycol (MIRALAX) 17 g packet Take 17 g by mouth daily. prn constipation  ? Semaglutide (RYBELSUS) 7 MG TABS One po qd  ? Vitamin D, Ergocalciferol, (DRISDOL) 1.25 MG (50000 UNIT) CAPS capsule 1 po q wed and 1 po q sun  ? ?No facility-administered encounter medications on file as of 07/01/2021.  ?  ? ?Past Medical History:  ?Diagnosis Date  ? Anxiety   ? Arthritis   ? Depression   ? Diabetes mellitus without complication ([place])   ?  Hyperlipidemia   ? OSA (obstructive sleep apnea) 01/11/2018  ? ? ?Past Surgical History:  ?Procedure Laterality Date  ? COLONOSCOPY    ? NO PAST SURGERIES    ? WISDOM TOOTH EXTRACTION    ? ? ?Family History  ?Problem Relation Age of Onset  ? Prostate cancer Father   ? Pulmonary embolism Father   ? Transient ischemic attack Sister   ? Cancer Sister   ?     lymph node cancer  ? Heart disease Mother   ? Heart failure Mother   ? Hypertension Other   ? Stroke Other   ? Clotting disorder Other   ?     Thromboembolism clotting disorder  ? Colon cancer Neg Hx   ? Stomach cancer Neg Hx   ? Rectal cancer Neg Hx   ? Pancreatic cancer Neg Hx   ? Esophageal cancer Neg Hx   ? Liver cancer Neg Hx   ? ? ?Social History  ? ?Social History Narrative  ? Not on file  ?  ? ?Review of Systems ?General: Denies fevers, chills, weight loss ?CV: Denies chest pain, shortness of breath, palpitations ? ?Physical Exam ? ?  07/01/2021  ?  2:56 PM 06/29/2021  ? 12:34 PM 06/29/2021  ? 12:33 PM  ?Vitals with BMI  ?Height 5' 9"   5' 9"   ?Weight 225 lbs  230 lbs  ?  BMI 33.21  33.95  ?Systolic 847 207   ?Diastolic 87 91   ?Pulse 80 90   ?  ?General:  No acute distress,  Alert and oriented, Non-Toxic, Normal speech and affect ?Right hand: Fingers well-perfused normal capillary refill.  Sensation is intact.  He is in an ulnar gutter splint. ? ?Assessment/Plan ?Patient presents with metacarpal fractures that are minimally displaced.  I discussed operative and nonoperative treatment with him.  We discussed the pros and cons of each.  Ultimately I recommended nonoperative treatment with immobilization for about a month.  He is in agreement with this plan.  We will plan to have him sent to hand therapy for a ulnar gutter splint and see him again in 3 to 4 weeks.  All of his questions were answered. ? ?Cindra Presume ?07/01/2021, 5:45 PM  ? ? ?  ?

## 2021-07-01 NOTE — Addendum Note (Signed)
Addended by: Cindra Presume on: 07/01/2021 05:52 PM ? ? Modules accepted: Orders ? ?

## 2021-07-03 ENCOUNTER — Encounter: Payer: Self-pay | Admitting: Plastic Surgery

## 2021-07-03 NOTE — Telephone Encounter (Signed)
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 Thank you

## 2021-07-03 NOTE — Telephone Encounter (Signed)
Any idea what they do specifically for the [facility]? If he uses his hand a lot and is right-hand dominant it can take up to six weeks. 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

## 2021-07-06 ENCOUNTER — Other Ambulatory Visit: Payer: Self-pay

## 2021-07-06 ENCOUNTER — Telehealth: Payer: Self-pay | Admitting: Plastic Surgery

## 2021-07-06 ENCOUNTER — Ambulatory Visit: Payer: BC Managed Care – PPO | Attending: Plastic Surgery | Admitting: Occupational Therapy

## 2021-07-06 DIAGNOSIS — M25641 Stiffness of right hand, not elsewhere classified: Secondary | ICD-10-CM | POA: Diagnosis present

## 2021-07-06 DIAGNOSIS — R600 Localized edema: Secondary | ICD-10-CM | POA: Insufficient documentation

## 2021-07-06 DIAGNOSIS — M6281 Muscle weakness (generalized): Secondary | ICD-10-CM | POA: Diagnosis present

## 2021-07-06 DIAGNOSIS — M79641 Pain in right hand: Secondary | ICD-10-CM | POA: Diagnosis present

## 2021-07-06 NOTE — Telephone Encounter (Signed)
Pt is returning a call back to the office did not say what it was in reference to. ?
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
## 2021-07-06 NOTE — Patient Instructions (Signed)
WEARING SCHEDULE:  ?Wear splint at ALL times except for hygiene care  ? ?PURPOSE:  ?To prevent movement and for protection until injury can heal ? ?CARE OF SPLINT:  ?Keep splint away from heat sources including: stove, radiator or furnace, or a car in sunlight. The splint can melt and will no longer fit you properly ? ?Keep away from pets and children ? ?Clean the splint with rubbing alcohol 1-2 times per day.  ?* During this time, make sure you also clean your hand/arm as instructed by your therapist and/or perform dressing changes as needed. Then dry hand/arm completely before replacing splint. (When cleaning hand/arm, keep it immobilized in same position until splint is replaced) ? ?PRECAUTIONS/POTENTIAL PROBLEMS: ?*If you notice or experience increased pain, swelling, numbness, or a lingering reddened area from the splint: ?Contact your therapist immediately by calling [phone]. You must wear the splint for protection, but we will get you scheduled for adjustments as quickly as possible.  ?(If only straps or hooks need to be replaced and NO adjustments to the splint need to be made, just call the office ahead and let them know you are coming in) ? ?If you have any medical concerns or signs of infection, please call your doctor immediately ?  ?

## 2021-07-07 ENCOUNTER — Encounter: Payer: Self-pay | Admitting: Physician Assistant

## 2021-07-07 NOTE — Telephone Encounter (Signed)
I spoke with patient yesterday. I will call him back once I get his work note for the 6 weeks out and fax to his employer. ?

## 2021-07-14 ENCOUNTER — Encounter: Payer: MEDICARE | Admitting: Occupational Therapy

## 2021-07-15 ENCOUNTER — Ambulatory Visit: Payer: BC Managed Care – PPO | Attending: Plastic Surgery | Admitting: Occupational Therapy

## 2021-07-15 ENCOUNTER — Encounter: Payer: Self-pay | Admitting: Occupational Therapy

## 2021-07-15 DIAGNOSIS — M25641 Stiffness of right hand, not elsewhere classified: Secondary | ICD-10-CM | POA: Diagnosis present

## 2021-07-15 DIAGNOSIS — M79641 Pain in right hand: Secondary | ICD-10-CM | POA: Insufficient documentation

## 2021-07-15 DIAGNOSIS — M6281 Muscle weakness (generalized): Secondary | ICD-10-CM | POA: Diagnosis present

## 2021-07-15 DIAGNOSIS — R600 Localized edema: Secondary | ICD-10-CM | POA: Insufficient documentation
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
## 2021-07-21 ENCOUNTER — Ambulatory Visit: Payer: BC Managed Care – PPO | Admitting: Occupational Therapy

## 2021-07-29 ENCOUNTER — Ambulatory Visit (INDEPENDENT_AMBULATORY_CARE_PROVIDER_SITE_OTHER): Payer: BC Managed Care – PPO | Admitting: Plastic Surgery

## 2021-07-29 ENCOUNTER — Encounter: Payer: Self-pay | Admitting: Plastic Surgery

## 2021-07-29 ENCOUNTER — Ambulatory Visit: Payer: BC Managed Care – PPO | Admitting: Occupational Therapy

## 2021-07-29 DIAGNOSIS — M79641 Pain in right hand: Secondary | ICD-10-CM | POA: Diagnosis not present

## 2021-07-29 DIAGNOSIS — S62324A Displaced fracture of shaft of fourth metacarpal bone, right hand, initial encounter for closed fracture: Secondary | ICD-10-CM

## 2021-07-29 DIAGNOSIS — R600 Localized edema: Secondary | ICD-10-CM

## 2021-07-29 DIAGNOSIS — M25641 Stiffness of right hand, not elsewhere classified: Secondary | ICD-10-CM
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
## 2021-07-29 NOTE — Patient Instructions (Addendum)
Flexor Tendon Gliding (Active Hook Fist) ? ? ?With fingers and knuckles straight, bend middle and tip joints. Do not bend large knuckles. Can gently push down middle and tip joints of fingers w/ other hand a little further if needed and then hold new position. However, do NOT push down on big knuckles ?Repeat _10-15___ times. Do _4-6___ sessions per day. ? ?MP Flexion (Active) ? ? ?Bend large knuckles as far as they will go, keeping small joints straight. ?Repeat _10-15___ times. Do __4-6__ sessions per day. ? ?  ?  ?Finger Flexion / Extension ? ? ?Bend fingers of right hand toward palm, making a fist. Straighten fingers, opening fist. ?Repeat sequence _10-15___ times per session. Do _4-6__ sessions per day. ? ? ?AROM: Wrist Extension ? ? ? ?With right palm down, bend wrist up. ?Repeat _10-15___ times per set. Do __1__ sets per session. Do __4-6__ sessions per day. ? ? ?

## 2021-07-29 NOTE — Progress Notes (Signed)
? ?  Referring Provider ?Carollee Herter, Kendrick Fries R, DO ?[address] ?[address] ?[place],  [place] [address]  ? ?CC:  ?Chief Complaint  ?Patient presents with  ? Follow-up  ?   ? ?Calvin Chandler is an 76 y.o. male.  ?HPI: Patient presents in follow-up for right hand metacarpal fractures.  He has been splinted for 1 month.  He feels like things are going well. ? ?Review of Systems ?General: Denies fevers and chills ? ?Physical Exam ? ?  07/01/2021  ?  2:56 PM 06/29/2021  ? 12:34 PM 06/29/2021  ? 12:33 PM  ?Vitals with BMI  ?Height '5\' 9"'$   '5\' 9"'$   ?Weight 225 lbs  230 lbs  ?BMI 33.21  33.95  ?Systolic 267 124   ?Diastolic 87 91   ?Pulse 80 90   ?  ?General:  No acute distress,  Alert and oriented, Non-Toxic, Normal speech and affect ?Right hand: Fingers well-perfused normal capillary refill palp radial pulse.  Sensation is intact throughout.  He has good range of motion of his thumb index and long finger.  Ring and small finger are understandably stiff as they have been splinted in ulnar gutter.  He is minimally tender over the metacarpal fracture sites. ? ?Assessment/Plan ?Patient presents with clinical healing of 2 minimally displaced metacarpal fractures.  I have asked him to wean himself out of the brac.  He has an appointment with hand therapy today and will begin range of motion exercises.  I will plan to see him in another 2 months to check his functional outcome after doing therapy for a while.  He is understanding of the plan and all of his questions were answered. ? ? Cindra Presume ?07/29/2021, 9:43 AM  ? ? ?    ?

## 2021-08-03 ENCOUNTER — Encounter: Payer: Self-pay | Admitting: Occupational Therapy

## 2021-08-03 ENCOUNTER — Ambulatory Visit: Payer: BC Managed Care – PPO | Admitting: Occupational Therapy

## 2021-08-03 DIAGNOSIS — R600 Localized edema: Secondary | ICD-10-CM

## 2021-08-03 DIAGNOSIS — M25641 Stiffness of right hand, not elsewhere classified: Secondary | ICD-10-CM

## 2021-08-03 DIAGNOSIS — M79641 Pain in right hand: Secondary | ICD-10-CM | POA: Diagnosis not present

## 2021-08-03 DIAGNOSIS — M6281 Muscle weakness (generalized): Secondary | ICD-10-CM
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
## 2021-08-03 NOTE — Patient Instructions (Signed)
? ? ?  AROM: PIP Flexion / Extension ? ? ?Pinch bottom knuckle of __ring and small ______ finger of hand to prevent bending. Actively bend middle knuckle until stretch is felt. Hold __5__ seconds. Relax. Straighten finger as far as possible. ?Repeat __10-15__ times per set. Do _4-6___ sessions per day. ? ? ?AROM: DIP Flexion / Extension ? ? ?Pinch middle knuckle of _ring and  small_______ finger of  hand to prevent bending. Bend end knuckle until stretch is felt. Hold _5___ seconds. Relax. Straighten finger as far as possible. ?Repeat _10-15___ times per set.  Do _4-6___ sessions per day. ? ?AROM: Finger Flexion / Extension ? ? ?Actively bend fingers of  hand. Start with knuckles furthest from palm, and slowly make a fist. Hold __5__ seconds. Relax. Then straighten fingers as far as possible. ?Repeat _10-15___ times per set.  Do _4-6___ sessions per day. ? ?Copyright ? [employer]. All rights reserved.  ? ? ?   ?

## 2021-08-05 ENCOUNTER — Encounter: Payer: Self-pay | Admitting: Occupational Therapy

## 2021-08-05 ENCOUNTER — Ambulatory Visit: Payer: BC Managed Care – PPO | Admitting: Occupational Therapy

## 2021-08-05 DIAGNOSIS — M25641 Stiffness of right hand, not elsewhere classified: Secondary | ICD-10-CM

## 2021-08-05 DIAGNOSIS — M79641 Pain in right hand: Secondary | ICD-10-CM | POA: Diagnosis not present

## 2021-08-05 DIAGNOSIS — R600 Localized edema: Secondary | ICD-10-CM
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
## 2021-08-07 ENCOUNTER — Encounter: Payer: Self-pay | Admitting: Plastic Surgery

## 2021-08-10 ENCOUNTER — Encounter: Payer: Self-pay | Admitting: Surgical

## 2021-08-10 NOTE — Telephone Encounter (Signed)
Patient called to follow up on request for updated letter to extend leave. Patient confirmed supervisor's name as Delray Alt, fax number is [phone]. Requesting letter be sent before the close of business today. ?

## 2021-08-11 ENCOUNTER — Encounter: Payer: MEDICARE | Admitting: Occupational Therapy

## 2021-08-13 ENCOUNTER — Ambulatory Visit: Payer: BC Managed Care – PPO | Admitting: Occupational Therapy

## 2021-08-17 ENCOUNTER — Ambulatory Visit: Payer: BC Managed Care – PPO | Admitting: Occupational Therapy

## 2021-08-19 ENCOUNTER — Encounter: Payer: MEDICARE | Admitting: Occupational Therapy

## 2021-08-20 ENCOUNTER — Ambulatory Visit: Payer: BC Managed Care – PPO | Attending: Plastic Surgery | Admitting: Occupational Therapy

## 2021-08-20 ENCOUNTER — Encounter: Payer: Self-pay | Admitting: Occupational Therapy

## 2021-08-20 DIAGNOSIS — R600 Localized edema: Secondary | ICD-10-CM | POA: Insufficient documentation

## 2021-08-20 DIAGNOSIS — M79641 Pain in right hand: Secondary | ICD-10-CM | POA: Insufficient documentation

## 2021-08-20 DIAGNOSIS — M25641 Stiffness of right hand, not elsewhere classified: Secondary | ICD-10-CM | POA: Diagnosis not present

## 2021-08-20 DIAGNOSIS — M6281 Muscle weakness (generalized): Secondary | ICD-10-CM | POA: Insufficient documentation
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
## 2021-08-20 NOTE — Patient Instructions (Addendum)
PIP Flexion (Passive) ? ? ? ?Use other hand to bend the middle joints of ___all___ fingers down as far as possible. Hold __10__ seconds. ?Repeat _5___ times. Do __4__ sessions per day. ? ? ?MP Flexion (Passive) ? ? ? ?Use other hand to gently bend __ring and small____ fingers at large knuckles. Keep wrist straight. Hold _10___ seconds. ?Repeat _5___ times. Do __4__ sessions per day. ? ? ?MP / PIP / DIP Composite Flexion (Passive Stretch) ? ? ? ?Use other hand to bend __all___ fingers at all three joints. Hold __10__ seconds. ?Repeat __5__ times. Do __4__ sessions per day. ? ?[employer] analyst) ? ? ? ?Sitting with elbows on table and palms together, slowly lower wrists toward table until stretch is felt. Be sure to keep palms together throughout stretch. Hold __10__ seconds. Relax. ?Repeat __5__ times. Do __4__ sessions per day. ? ? ? ? ? ?

## 2021-08-24 ENCOUNTER — Ambulatory Visit: Payer: BC Managed Care – PPO | Admitting: Occupational Therapy

## 2021-08-24 ENCOUNTER — Encounter: Payer: Self-pay | Admitting: Occupational Therapy

## 2021-08-24 DIAGNOSIS — M25641 Stiffness of right hand, not elsewhere classified: Secondary | ICD-10-CM

## 2021-08-24 DIAGNOSIS — M79641 Pain in right hand: Secondary | ICD-10-CM

## 2021-08-24 DIAGNOSIS — R600 Localized edema: Secondary | ICD-10-CM

## 2021-08-24 DIAGNOSIS — M6281 Muscle weakness (generalized): Secondary | ICD-10-CM
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
## 2021-08-26 ENCOUNTER — Telehealth: Payer: Self-pay | Admitting: Plastic Surgery

## 2021-08-26 ENCOUNTER — Encounter: Payer: MEDICARE | Admitting: Occupational Therapy

## 2021-08-26 NOTE — Telephone Encounter (Signed)
Paperwork completed and faxed to [facility] at [phone]. Confirmation sheet received. Outbound call placed to inform patient fax successfully sent and hard copy available for pickup. Left voicemail message. ?

## 2021-08-26 NOTE — Telephone Encounter (Signed)
Patient came to office to drop of FMLA paperwork; requesting for it to be completed and faxed to his employer today if possible. Fax number is [phone], attn: Jerene Pitch. Patient also requesting a hard copy for his records. Patient paid $20 FMLA fee. ? ?Please advise at [phone] when paperwork faxed and ready for pickup. ?

## 2021-08-27 ENCOUNTER — Encounter: Payer: Self-pay | Admitting: Occupational Therapy

## 2021-08-27 ENCOUNTER — Ambulatory Visit: Payer: BC Managed Care – PPO | Admitting: Occupational Therapy

## 2021-08-27 DIAGNOSIS — M79641 Pain in right hand: Secondary | ICD-10-CM

## 2021-08-27 DIAGNOSIS — M25641 Stiffness of right hand, not elsewhere classified: Secondary | ICD-10-CM

## 2021-08-27 DIAGNOSIS — M6281 Muscle weakness (generalized): Secondary | ICD-10-CM

## 2021-08-27 NOTE — Patient Instructions (Signed)
  1. Grip Strengthening (Resistive Putty)   Squeeze putty using thumb and all fingers. Repeat _20___ times. Do __2__ sessions per day.   2. IP Fisting (Resistive Putty)    Keeping knuckles straight, bend fingertips to squeeze putty. Repeat __10__ times. Do __2__ sessions per day.
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
## 2021-08-31 ENCOUNTER — Encounter: Payer: Self-pay | Admitting: Occupational Therapy

## 2021-08-31 ENCOUNTER — Ambulatory Visit: Payer: BC Managed Care – PPO | Admitting: Occupational Therapy

## 2021-08-31 DIAGNOSIS — M25641 Stiffness of right hand, not elsewhere classified: Secondary | ICD-10-CM

## 2021-08-31 DIAGNOSIS — M6281 Muscle weakness (generalized): Secondary | ICD-10-CM

## 2021-08-31 DIAGNOSIS — M79641 Pain in right hand: Secondary | ICD-10-CM
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
## 2021-09-02 ENCOUNTER — Encounter: Payer: MEDICARE | Admitting: Occupational Therapy

## 2021-09-03 ENCOUNTER — Ambulatory Visit: Payer: BC Managed Care – PPO | Admitting: Occupational Therapy

## 2021-09-03 ENCOUNTER — Encounter: Payer: Self-pay | Admitting: Occupational Therapy

## 2021-09-03 DIAGNOSIS — M25641 Stiffness of right hand, not elsewhere classified: Secondary | ICD-10-CM | POA: Diagnosis not present

## 2021-09-03 DIAGNOSIS — M6281 Muscle weakness (generalized): Secondary | ICD-10-CM

## 2021-09-03 DIAGNOSIS — M79641 Pain in right hand: Secondary | ICD-10-CM
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
## 2021-09-08 ENCOUNTER — Ambulatory Visit: Payer: BC Managed Care – PPO | Admitting: Occupational Therapy

## 2021-09-08 ENCOUNTER — Encounter: Payer: Self-pay | Admitting: Occupational Therapy

## 2021-09-08 DIAGNOSIS — M25641 Stiffness of right hand, not elsewhere classified: Secondary | ICD-10-CM

## 2021-09-08 DIAGNOSIS — M79641 Pain in right hand: Secondary | ICD-10-CM

## 2021-09-08 DIAGNOSIS — M6281 Muscle weakness (generalized): Secondary | ICD-10-CM

## 2021-09-08 DIAGNOSIS — R600 Localized edema: Secondary | ICD-10-CM

## 2021-09-09 ENCOUNTER — Ambulatory Visit: Payer: BC Managed Care – PPO | Admitting: Occupational Therapy

## 2021-09-09 ENCOUNTER — Encounter: Payer: Self-pay | Admitting: Occupational Therapy

## 2021-09-09 ENCOUNTER — Encounter: Payer: MEDICARE | Admitting: Occupational Therapy

## 2021-09-09 DIAGNOSIS — M79641 Pain in right hand: Secondary | ICD-10-CM

## 2021-09-09 DIAGNOSIS — M6281 Muscle weakness (generalized): Secondary | ICD-10-CM

## 2021-09-09 DIAGNOSIS — M25641 Stiffness of right hand, not elsewhere classified: Secondary | ICD-10-CM
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
## 2021-09-15 ENCOUNTER — Ambulatory Visit: Payer: BC Managed Care – PPO | Attending: Plastic Surgery | Admitting: Occupational Therapy

## 2021-09-15 ENCOUNTER — Encounter: Payer: Self-pay | Admitting: Occupational Therapy

## 2021-09-15 DIAGNOSIS — M25641 Stiffness of right hand, not elsewhere classified: Secondary | ICD-10-CM | POA: Diagnosis present

## 2021-09-15 DIAGNOSIS — M79641 Pain in right hand: Secondary | ICD-10-CM | POA: Insufficient documentation

## 2021-09-15 DIAGNOSIS — R600 Localized edema: Secondary | ICD-10-CM | POA: Insufficient documentation

## 2021-09-15 DIAGNOSIS — M6281 Muscle weakness (generalized): Secondary | ICD-10-CM | POA: Diagnosis present
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
## 2021-09-17 ENCOUNTER — Ambulatory Visit: Payer: BC Managed Care – PPO | Admitting: Occupational Therapy

## 2021-09-17 ENCOUNTER — Encounter: Payer: Self-pay | Admitting: Occupational Therapy

## 2021-09-17 DIAGNOSIS — M25641 Stiffness of right hand, not elsewhere classified: Secondary | ICD-10-CM | POA: Diagnosis not present

## 2021-09-17 DIAGNOSIS — M79641 Pain in right hand: Secondary | ICD-10-CM

## 2021-09-17 DIAGNOSIS — M6281 Muscle weakness (generalized): Secondary | ICD-10-CM
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
## 2021-09-21 ENCOUNTER — Encounter: Payer: Self-pay | Admitting: Occupational Therapy

## 2021-09-21 ENCOUNTER — Ambulatory Visit: Payer: BC Managed Care – PPO | Admitting: Occupational Therapy

## 2021-09-21 DIAGNOSIS — M25641 Stiffness of right hand, not elsewhere classified: Secondary | ICD-10-CM | POA: Diagnosis not present

## 2021-09-21 DIAGNOSIS — M6281 Muscle weakness (generalized): Secondary | ICD-10-CM

## 2021-09-21 DIAGNOSIS — M79641 Pain in right hand: Secondary | ICD-10-CM
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
## 2021-09-23 ENCOUNTER — Ambulatory Visit: Payer: MEDICARE | Admitting: Plastic Surgery

## 2021-09-23 ENCOUNTER — Ambulatory Visit (INDEPENDENT_AMBULATORY_CARE_PROVIDER_SITE_OTHER): Payer: BC Managed Care – PPO | Admitting: Plastic Surgery

## 2021-09-23 ENCOUNTER — Encounter: Payer: Self-pay | Admitting: Plastic Surgery

## 2021-09-23 DIAGNOSIS — S62324A Displaced fracture of shaft of fourth metacarpal bone, right hand, initial encounter for closed fracture: Secondary | ICD-10-CM

## 2021-09-23 NOTE — Progress Notes (Signed)
   Referring Provider Carollee Herter, Yvonne R, DO [address] [place],  [place] [address]   CC:  Chief Complaint  Patient presents with   Follow-up      Calvin Chandler is an 76 y.o. male.  HPI: Patient presents in follow-up for right metacarpal fractures.  He feels like things have gone well with nonoperative treatment.  He does still note some residual stiffness with the small and ring finger and it takes increased effort to get them down into his palm but overall from a functional standpoint he feels generally happy.  Review of Systems General: Denies fevers and chills  Physical Exam    07/01/2021    2:56 PM 06/29/2021   12:34 PM 06/29/2021   12:33 PM  Vitals with BMI  Height '5\' 9"'$   '5\' 9"'$   Weight 225 lbs  230 lbs  BMI 17.61  60.73  Systolic 710 626   Diastolic 87 91   Pulse 80 90     General:  No acute distress,  Alert and oriented, Non-Toxic, Normal speech and affect Right hand: Fingers well-perfused normal cap refill palp radial pulse.  Sensation is intact throughout.  He has good extension throughout.  Flexion is a little slower with the small and ring finger and he cannot quite get them down into his palm without some assistance or increased effort.  Assessment/Plan Patient doing reasonably well after nonoperative treatment for metacarpal fractures.  He has reasonable functional outcome and I would expect him to get better as he continues to work on range of motion.  He does still have another physical therapy visit and plans to stop going to physical therapy after that.  He has a home exercise program and things that he can work on to get his last bit of flexion back.  I offered to see him again or follow-up as needed and at this point he prefer to follow-up on an as-needed basis.  All of his questions were answered.  Cindra Presume 09/23/2021, 10:14 AM

## 2021-09-24 ENCOUNTER — Encounter: Payer: Self-pay | Admitting: Occupational Therapy

## 2021-09-24 ENCOUNTER — Ambulatory Visit: Payer: BC Managed Care – PPO | Admitting: Occupational Therapy

## 2021-09-24 DIAGNOSIS — M79641 Pain in right hand: Secondary | ICD-10-CM

## 2021-09-24 DIAGNOSIS — M25641 Stiffness of right hand, not elsewhere classified: Secondary | ICD-10-CM | POA: Diagnosis not present

## 2021-09-24 DIAGNOSIS — M6281 Muscle weakness (generalized): Secondary | ICD-10-CM
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
## 2021-10-16 ENCOUNTER — Ambulatory Visit (AMBULATORY_SURGERY_CENTER): Payer: Self-pay | Admitting: *Deleted

## 2021-10-16 VITALS — Ht 69.0 in | Wt 236.0 lb

## 2021-10-16 DIAGNOSIS — Z86010 Personal history of colonic polyps: Secondary | ICD-10-CM

## 2021-10-16 MED ORDER — NA SULFATE-K SULFATE-MG SULF 17.5-3.13-1.6 GM/177ML PO SOLN: 1.0000 | Freq: Once | ORAL | 0 refills | Status: AC

## 2021-10-16 NOTE — Progress Notes (Signed)
No egg or soy allergy known to patient  No issues known to pt with past sedation with any surgeries or procedures Patient denies ever being told they had issues or difficulty with intubation  No FH of Malignant Hyperthermia Pt is not on diet pills Pt is not on  home 02  Pt is not on blood thinners  Pt denies issues with constipation  uses Miralax EOD , no constipation issues  No A fib or A flutter Have any cardiac testing pending--pt denies  Pt instructed to use [email or web address] or GoodRx for a price reduction on prep

## 2021-10-30 ENCOUNTER — Encounter: Payer: Self-pay | Admitting: Gastroenterology

## 2021-10-30 ENCOUNTER — Ambulatory Visit (AMBULATORY_SURGERY_CENTER): Payer: BC Managed Care – PPO | Admitting: Gastroenterology

## 2021-10-30 VITALS — BP 119/81 | HR 69 | Temp 98.2°F | Resp 14 | Ht 69.0 in | Wt 236.0 lb

## 2021-10-30 DIAGNOSIS — Z86010 Personal history of colonic polyps: Secondary | ICD-10-CM

## 2021-10-30 DIAGNOSIS — Z09 Encounter for follow-up examination after completed treatment for conditions other than malignant neoplasm: Secondary | ICD-10-CM

## 2021-10-30 DIAGNOSIS — Z860101 Personal history of adenomatous and serrated colon polyps: Secondary | ICD-10-CM

## 2021-10-30 HISTORY — PX: COLONOSCOPY WITH PROPOFOL: SHX5780

## 2021-10-30 MED ORDER — SODIUM CHLORIDE 0.9 % IV SOLN: 500.0000 mL | Freq: Once | INTRAVENOUS | Status: DC

## 2021-10-30 NOTE — Op Note (Signed)
[facility] Patient Name: Calvin Chandler Procedure Date: 10/30/2021 10:30 AM MRN: [id number] Endoscopist: Ladene Artist , MD Age: 76 Referring MD:  Date of Birth: Jul 31, 1945 Gender: Male Account #: [id number] Procedure:                Colonoscopy Indications:              Surveillance: Personal history of adenomatous                            polyps on last colonoscopy > 3 years ago Medicines:                Monitored Anesthesia Care Procedure:                Pre-Anesthesia Assessment:                           - Prior to the procedure, a History and Physical                            was performed, and patient medications and                            allergies were reviewed. The patient's tolerance of                            previous anesthesia was also reviewed. The risks                            and benefits of the procedure and the sedation                            options and risks were discussed with the patient.                            All questions were answered, and informed consent                            was obtained. Prior Anticoagulants: The patient has                            taken no previous anticoagulant or antiplatelet                            agents. ASA Grade Assessment: II - A patient with                            mild systemic disease. After reviewing the risks                            and benefits, the patient was deemed in                            satisfactory condition to undergo the procedure.  After obtaining informed consent, the colonoscope                            was passed under direct vision. Throughout the                            procedure, the patient's blood pressure, pulse, and                            oxygen saturations were monitored continuously. The                            CF [id number] #[id number] was introduced through the anus                            and advanced to the  the cecum, identified by                            appendiceal orifice and ileocecal valve. The                            ileocecal valve, appendiceal orifice, and rectum                            were photographed. The quality of the bowel                            preparation was adequate. The colonoscopy was                            performed without difficulty. The patient tolerated                            the procedure well. Scope In: 10:42:38 AM Scope Out: 10:55:36 AM Scope Withdrawal Time: 0 hours 9 minutes 31 seconds  Total Procedure Duration: 0 hours 12 minutes 58 seconds  Findings:                 The perianal and digital rectal examinations were                            normal.                           Multiple medium-mouthed diverticula were found in                            the left colon. There was evidence of diverticular                            spasm. There was evidence of an impacted                            diverticulum. There was no evidence of diverticular  bleeding.                           Internal hemorrhoids were found during                            retroflexion. The hemorrhoids were moderate and                            Grade I (internal hemorrhoids that do not prolapse).                           The exam was otherwise without abnormality on                            direct and retroflexion views. Complications:            No immediate complications. Estimated blood loss:                            None. Estimated Blood Loss:     Estimated blood loss: none. Impression:               - Mild diverticulosis in the left colon.                           - Internal hemorrhoids.                           - The examination was otherwise normal on direct                            and retroflexion views.                           - No specimens collected. Recommendation:           - Patient has a contact number  available for                            emergencies. The signs and symptoms of potential                            delayed complications were discussed with the                            patient. Return to normal activities tomorrow.                            Written discharge instructions were provided to the                            patient.                           - High fiber diet.                           -  Continue present medications.                           - No repeat colonoscopy due to age and the absence                            of colonic polyps. Ladene Artist, MD 10/30/2021 10:58:57 AM This report has been signed electronically.

## 2021-10-30 NOTE — Progress Notes (Signed)
Pt's states no medical or surgical changes since previsit or office visit. 

## 2021-10-30 NOTE — Progress Notes (Signed)
Report to PACU, RN, vss, BBS= Clear.  

## 2021-10-30 NOTE — Progress Notes (Signed)
History & Physical  Primary Care Physician:  Carollee Herter, Alferd Apa, DO Primary Gastroenterologist: Lucio Edward, MD  CHIEF COMPLAINT:  Personal history of colon polyps   HPI: Calvin Chandler is a 76 y.o. male with a personal history of adenomatous colon polyps for surveillance colonoscopy.   Past Medical History:  Diagnosis Date   Allergy    Anxiety    Arthritis    knees, hands, back , hips   Cataract    forming   Depression    Diabetes mellitus without complication ([place])    Hyperlipidemia    OSA (obstructive sleep apnea) 01/11/2018   no cpap   Sleep apnea     Past Surgical History:  Procedure Laterality Date   COLONOSCOPY  10/26/2017   COLONOSCOPY WITH PROPOFOL  10/30/2021   EYE SURGERY     POLYPECTOMY     WISDOM TOOTH EXTRACTION      Prior to Admission medications   Medication Sig Start Date End Date Taking? Authorizing Provider  Accu-Chek Softclix Lancets lancets 1 each by Other route as directed. Use as instructed 01/09/20  Yes Shamleffer, Melanie Crazier, MD  Blood Glucose Monitoring Suppl (ACCU-CHEK GUIDE) w/Device KIT 1 Device by Does not apply route as directed. 01/01/20  Yes Shamleffer, Melanie Crazier, MD  glucose blood (ACCU-CHEK GUIDE) test strip Use 2-4 times daily as needed 02/13/20  Yes Opalski, Deborah, DO  Lancets Misc. (ACCU-CHEK SOFTCLIX LANCET DEV) KIT 1 Device by Does not apply route as directed. 01/09/20  Yes Shamleffer, Melanie Crazier, MD  Multiple Vitamins-Minerals (MULTIVITAMIN MEN 50+ PO) Take by mouth.   Yes [provider]  polyethylene glycol (MIRALAX) 17 g packet Take 17 g by mouth daily. prn constipation 02/27/20  Yes Opalski, Neoma Laming, DO  atorvastatin (LIPITOR) 10 MG tablet Take 1 tablet (10 mg total) by mouth daily. Patient not taking: Reported on 10/16/2021 01/01/20   Shamleffer, Melanie Crazier, MD  glipiZIDE (GLUCOTROL) 10 MG tablet Take 1 tablet (10 mg total) by mouth daily before breakfast. Patient not taking: Reported on  10/16/2021 06/23/20   Shamleffer, Melanie Crazier, MD  Semaglutide (RYBELSUS) 7 MG TABS One po qd Patient not taking: Reported on 10/16/2021 07/01/20   Mellody Dance, DO  Vitamin D, Ergocalciferol, (DRISDOL) 1.25 MG (50000 UNIT) CAPS capsule 1 po q wed and 1 po q sun Patient not taking: Reported on 10/30/2021 07/01/20   Mellody Dance, DO    Current Outpatient Medications  Medication Sig Dispense Refill   Accu-Chek Softclix Lancets lancets 1 each by Other route as directed. Use as instructed 100 each 12   Blood Glucose Monitoring Suppl (ACCU-CHEK GUIDE) w/Device KIT 1 Device by Does not apply route as directed. 1 kit 0   glucose blood (ACCU-CHEK GUIDE) test strip Use 2-4 times daily as needed 375 each 3   Lancets Misc. (ACCU-CHEK SOFTCLIX LANCET DEV) KIT 1 Device by Does not apply route as directed. 1 kit 3   Multiple Vitamins-Minerals (MULTIVITAMIN MEN 50+ PO) Take by mouth.     polyethylene glycol (MIRALAX) 17 g packet Take 17 g by mouth daily. prn constipation 14 each 0   atorvastatin (LIPITOR) 10 MG tablet Take 1 tablet (10 mg total) by mouth daily. (Patient not taking: Reported on 10/16/2021) 90 tablet 3   glipiZIDE (GLUCOTROL) 10 MG tablet Take 1 tablet (10 mg total) by mouth daily before breakfast. (Patient not taking: Reported on 10/16/2021) 90 tablet 1   Semaglutide (RYBELSUS) 7 MG TABS One po qd (Patient not taking:  Reported on 10/16/2021) 30 tablet 0   Vitamin D, Ergocalciferol, (DRISDOL) 1.25 MG (50000 UNIT) CAPS capsule 1 po q wed and 1 po q sun (Patient not taking: Reported on 10/30/2021) 8 capsule 0   Current Facility-Administered Medications  Medication Dose Route Frequency Provider Last Rate Last Admin   0.9 %  sodium chloride infusion  500 mL Intravenous Once Ladene Artist, MD        Allergies as of 10/30/2021 - Review Complete 10/30/2021  Allergen Reaction Noted   Metformin and related Diarrhea 11/29/2019    Family History  Problem Relation Age of Onset   Heart disease  Mother    Heart failure Mother    Prostate cancer Father    Pulmonary embolism Father    Stomach cancer Sister    Breast cancer Sister    Transient ischemic attack Sister    Cancer Sister        lymph node cancer   Hypertension Other    Stroke Other    Clotting disorder Other        Thromboembolism clotting disorder   Colon cancer Neg Hx    Rectal cancer Neg Hx    Pancreatic cancer Neg Hx    Esophageal cancer Neg Hx    Liver cancer Neg Hx    Colon polyps Neg Hx     Social History   Socioeconomic History   Marital status: Married    Spouse name: Not on file   Number of children: Not on file   Years of education: Not on file   Highest education level: Not on file  Occupational History   Occupation: Recruitment consultant  Tobacco Use   Smoking status: Former    Packs/day: 3.00    Years: 13.00    Total pack years: 39.00    Types: Cigarettes    Quit date: 03/03/1981    Years since quitting: 40.6   Smokeless tobacco: Never  Vaping Use   Vaping Use: Never used  Substance and Sexual Activity   Alcohol use: Yes    Comment: 6 oz   Drug use: No   Sexual activity: Yes  Other Topics Concern   Not on file  Social History Narrative   Not on file   Social Determinants of Health   Financial Resource Strain: Not on file  Food Insecurity: Not on file  Transportation Needs: Not on file  Physical Activity: Not on file  Stress: Not on file  Social Connections: Not on file  Intimate Partner Violence: Not on file    Review of Systems:  All systems reviewed were negative except where noted in HPI.   Physical Exam: General:  Alert, well-developed, in NAD Head:  Normocephalic and atraumatic. Eyes:  Sclera clear, no icterus.   Conjunctiva pink. Ears:  Normal auditory acuity. Mouth:  No deformity or lesions.  Neck:  Supple; no masses . Lungs:  Clear throughout to auscultation.   No wheezes, crackles, or rhonchi. No acute distress. Heart:  Regular rate and rhythm; no  murmurs. Abdomen:  Soft, nondistended, nontender. No masses, hepatomegaly. No obvious masses.  Normal bowel .    Rectal:  Deferred   Msk:  Symmetrical without gross deformities.. Pulses:  Normal pulses noted. Extremities:  Without edema. Neurologic:  Alert and  oriented x4;  grossly normal neurologically. Skin:  Intact without significant lesions or rashes. Cervical Nodes:  No significant cervical adenopathy. Psych:  Alert and cooperative. Normal mood and affect.   Impression / Plan:  Personal history of adenomatous colon polyps for surveillance colonoscopy.  Pricilla Riffle. Fuller Plan  10/30/2021, 10:36 AM See Shea Evans, [facility], to contact our on call provider

## 2021-10-30 NOTE — Patient Instructions (Signed)
Information on diverticulosis and hemorrhoids given to you today.  Eat a high fiber diet and drink plenty of fluids.  No repeat colonoscopy due to age and absence of colonic polyps.    YOU HAD AN ENDOSCOPIC PROCEDURE TODAY AT [facility]:   Refer to the procedure report that was given to you for any specific questions about what was found during the examination.  If the procedure report does not answer your questions, please call your gastroenterologist to clarify.  If you requested that your care partner not be given the details of your procedure findings, then the procedure report has been included in a sealed envelope for you to review at your convenience later.  YOU SHOULD EXPECT: Some feelings of bloating in the abdomen. Passage of more gas than usual.  Walking can help get rid of the air that was put into your GI tract during the procedure and reduce the bloating. If you had a lower endoscopy (such as a colonoscopy or flexible sigmoidoscopy) you may notice spotting of blood in your stool or on the toilet paper. If you underwent a bowel prep for your procedure, you may not have a normal bowel movement for a few days.  Please Note:  You might notice some irritation and congestion in your nose or some drainage.  This is from the oxygen used during your procedure.  There is no need for concern and it should clear up in a day or so.  SYMPTOMS TO REPORT IMMEDIATELY:  Following lower endoscopy (colonoscopy or flexible sigmoidoscopy):  Excessive amounts of blood in the stool  Significant tenderness or worsening of abdominal pains  Swelling of the abdomen that is new, acute  Fever of 100F or higher   For urgent or emergent issues, a gastroenterologist can be reached at any hour by calling [phone]. Do not use MyChart messaging for urgent concerns.    DIET:  We do recommend a small meal at first, but then you may proceed to your regular diet.  Drink plenty of fluids but  you should avoid alcoholic beverages for 24 hours.  ACTIVITY:  You should plan to take it easy for the rest of today and you should NOT DRIVE or use heavy machinery until tomorrow (because of the sedation medicines used during the test).    FOLLOW UP: Our staff will call the number listed on your records the next business day following your procedure.  We will call around 7:15- 8:00 am to check on you and address any questions or concerns that you may have regarding the information given to you following your procedure. If we do not reach you, we will leave a message.  If you develop any symptoms (ie: fever, flu-like symptoms, shortness of breath, cough etc.) before then, please call [phone].  If you test positive for Covid 19 in the 2 weeks post procedure, please call and report this information to [place].    If any biopsies were taken you will be contacted by phone or by letter within the next 1-3 weeks.  Please call us at [phone] if you have not heard about the biopsies in 3 weeks.    SIGNATURES/CONFIDENTIALITY: You and/or your care partner have signed paperwork which will be entered into your electronic medical record.  These signatures attest to the fact that that the information above on your After Visit Summary has been reviewed and is understood.  Full responsibility of the confidentiality of this discharge information lies with you and/or  your care-partner.

## 2021-11-02 ENCOUNTER — Telehealth: Payer: Self-pay

## 2021-11-02 NOTE — Telephone Encounter (Signed)
No answer, left message to call if having any issues or concerns, B.Hikari Tripp RN 

## 2021-11-17 ENCOUNTER — Ambulatory Visit (INDEPENDENT_AMBULATORY_CARE_PROVIDER_SITE_OTHER): Payer: BC Managed Care – PPO | Admitting: Family Medicine

## 2021-11-17 ENCOUNTER — Encounter: Payer: Self-pay | Admitting: Family Medicine

## 2021-11-17 VITALS — BP 120/80 | HR 78 | Temp 97.8°F | Resp 18 | Ht 69.0 in | Wt 235.8 lb

## 2021-11-17 DIAGNOSIS — Z23 Encounter for immunization: Secondary | ICD-10-CM | POA: Diagnosis not present

## 2021-11-17 DIAGNOSIS — Z6832 Body mass index (BMI) 32.0-32.9, adult: Secondary | ICD-10-CM

## 2021-11-17 DIAGNOSIS — E1169 Type 2 diabetes mellitus with other specified complication: Secondary | ICD-10-CM

## 2021-11-17 DIAGNOSIS — E119 Type 2 diabetes mellitus without complications: Secondary | ICD-10-CM

## 2021-11-17 DIAGNOSIS — E1165 Type 2 diabetes mellitus with hyperglycemia: Secondary | ICD-10-CM

## 2021-11-17 DIAGNOSIS — G4733 Obstructive sleep apnea (adult) (pediatric): Secondary | ICD-10-CM

## 2021-11-17 DIAGNOSIS — E669 Obesity, unspecified: Secondary | ICD-10-CM

## 2021-11-17 DIAGNOSIS — E66811 Obesity, class 1: Secondary | ICD-10-CM

## 2021-11-17 DIAGNOSIS — R413 Other amnesia: Secondary | ICD-10-CM

## 2021-11-17 DIAGNOSIS — E559 Vitamin D deficiency, unspecified: Secondary | ICD-10-CM

## 2021-11-17 DIAGNOSIS — E785 Hyperlipidemia, unspecified: Secondary | ICD-10-CM | POA: Diagnosis not present

## 2021-11-17 DIAGNOSIS — H9193 Unspecified hearing loss, bilateral: Secondary | ICD-10-CM

## 2021-11-17 LAB — CBC WITH DIFFERENTIAL/PLATELET
Basophils Absolute: 0 10*3/uL (ref 0.0–0.1)
Basophils Relative: 1.1 % (ref 0.0–3.0)
Eosinophils Absolute: 0.1 10*3/uL (ref 0.0–0.7)
Eosinophils Relative: 2.8 % (ref 0.0–5.0)
HCT: 41.9 % (ref 39.0–52.0)
Hemoglobin: 13.8 g/dL (ref 13.0–17.0)
Lymphocytes Relative: 27.9 % (ref 12.0–46.0)
Lymphs Abs: 1 10*3/uL (ref 0.7–4.0)
MCHC: 32.9 g/dL (ref 30.0–36.0)
MCV: 86.2 fl (ref 78.0–100.0)
Monocytes Absolute: 0.3 10*3/uL (ref 0.1–1.0)
Monocytes Relative: 7.8 % (ref 3.0–12.0)
Neutro Abs: 2.2 10*3/uL (ref 1.4–7.7)
Neutrophils Relative %: 60.4 % (ref 43.0–77.0)
Platelets: 205 10*3/uL (ref 150.0–400.0)
RBC: 4.86 Mil/uL (ref 4.22–5.81)
RDW: 14.6 % (ref 11.5–15.5)
WBC: 3.6 10*3/uL — ABNORMAL LOW (ref 4.0–10.5)

## 2021-11-17 LAB — COMPREHENSIVE METABOLIC PANEL
ALT: 11 U/L (ref 0–53)
AST: 16 U/L (ref 0–37)
Albumin: 4.2 g/dL (ref 3.5–5.2)
Alkaline Phosphatase: 54 U/L (ref 39–117)
BUN: 19 mg/dL (ref 6–23)
CO2: 26 mEq/L (ref 19–32)
Calcium: 9.2 mg/dL (ref 8.4–10.5)
Chloride: 102 mEq/L (ref 96–112)
Creatinine, Ser: 1.01 mg/dL (ref 0.40–1.50)
GFR: 73.45 mL/min (ref >60.00)
Glucose, Bld: 163 mg/dL — ABNORMAL HIGH (ref 70–99)
Potassium: 4.2 mEq/L (ref 3.5–5.1)
Sodium: 137 mEq/L (ref 135–145)
Total Bilirubin: 1.3 mg/dL — ABNORMAL HIGH (ref 0.2–1.2)
Total Protein: 7.2 g/dL (ref 6.0–8.3)

## 2021-11-17 LAB — MICROALBUMIN / CREATININE URINE RATIO
Creatinine,U: 129.9 mg/dL
Microalb Creat Ratio: 0.7 mg/g (ref 0.0–30.0)
Microalb, Ur: 0.9 mg/dL (ref 0.0–1.9)

## 2021-11-17 LAB — LIPID PANEL
Cholesterol: 194 mg/dL (ref 0–200)
HDL: 58.3 mg/dL (ref >39.00)
LDL Cholesterol: 117 mg/dL — ABNORMAL HIGH (ref 0–99)
NonHDL: 135.31
Total CHOL/HDL Ratio: 3
Triglycerides: 91 mg/dL (ref 0.0–149.0)
VLDL: 18.2 mg/dL (ref 0.0–40.0)

## 2021-11-17 LAB — HEMOGLOBIN A1C: Hgb A1c MFr Bld: 10.4 % — ABNORMAL HIGH (ref 4.6–6.5)

## 2021-11-17 LAB — PSA: PSA: 0.86 ng/mL (ref 0.10–4.00)

## 2021-11-17 NOTE — Progress Notes (Signed)
Subjective:   By signing my name below, I, Calvin Chandler, attest that this documentation has been prepared under the direction and in the presence of Dr. Roma Schanz, DO. 11/17/2021    Patient ID: Calvin Chandler, male    DOB: 1945-11-30, 76 y.o.   MRN: [id number]  Chief Complaint  Patient presents with   Hyperlipidemia   Diabetes   Follow-up    HPI Patient is in today for a comprehensive physical exam.   His wife reports he has difficulty hearing. His wife bought him OTC hearing aides but he does not wear them.  She also reports he has mood swings. He gets agitated easily. She also reports he has difficulty remembering things. She notes he is frequently in a daze. Patient reports he has some difficulty sleeping. He does not have a CPAP machine. His blood pressure is doing well during this visit.  BP Readings from Last 3 Encounters:  11/17/21 120/80  10/30/21 119/81  07/01/21 130/87   Pulse Readings from Last 3 Encounters:  11/17/21 78  10/30/21 69  07/01/21 80   He does not check his blood sugar regularly. He does not follow up with his endocrinologist regularly. He stopped taking his medication and following up with his endocrinologist after he stopped seeing a healthy weight and wellness clinic.  Lab Results  Component Value Date   HGBA1C 10.4 (H) 11/17/2021   He denies having any fever, new moles, congestion, sore throat, new muscle pain, new joint pain, chest pain, cough, SOB, wheezing, n/v/d, constipation, blood in stool, dysuria, frequency, hematuria, or headaches at this time. His sister had a mini-stroke, otherwise he has no recent changes to his family medical history. He reports he originally had 3 sisters but one passed away from cancer and the other passed away from an blood clot in both lungs. He reports his father had a history of frequent blood clots.  He is currently on a intermittent fasting diet plan and reports losing 10 lb's since he started.  Wt  Readings from Last 3 Encounters:  11/17/21 235 lb 12.8 oz (107 kg)  10/30/21 236 lb (107 kg)  10/16/21 236 lb (107 kg)   He reports receiving the older shingles vaccine. He is interested in receiving the shingrix vaccine during this visit.    Past Medical History:  Diagnosis Date   Allergy    Anxiety    Arthritis    knees, hands, back , hips   Cataract    forming   Depression    Diabetes mellitus without complication (HCC)    Hyperlipidemia    OSA (obstructive sleep apnea) 01/11/2018   no cpap   Sleep apnea     Past Surgical History:  Procedure Laterality Date   COLONOSCOPY  10/26/2017   COLONOSCOPY WITH PROPOFOL  10/30/2021   EYE SURGERY     POLYPECTOMY     WISDOM TOOTH EXTRACTION      Family History  Problem Relation Age of Onset   Heart disease Mother    Heart failure Mother    Prostate cancer Father    Pulmonary embolism Father    Stomach cancer Sister    Breast cancer Sister    Transient ischemic attack Sister    Cancer Sister        lymph node cancer   Transient ischemic attack Sister    Clotting disorder Sister    Hypertension Other    Stroke Other    Clotting disorder Other  Thromboembolism clotting disorder   Colon cancer Neg Hx    Rectal cancer Neg Hx    Pancreatic cancer Neg Hx    Esophageal cancer Neg Hx    Liver cancer Neg Hx    Colon polyps Neg Hx     Social History   Socioeconomic History   Marital status: Married    Spouse name: Not on file   Number of children: Not on file   Years of education: Not on file   Highest education Chandler: Not on file  Occupational History   Occupation: Recruitment consultant  Tobacco Use   Smoking status: Former    Packs/day: 3.00    Years: 13.00    Total pack years: 39.00    Types: Cigarettes    Quit date: 03/03/1981    Years since quitting: 40.7   Smokeless tobacco: Never  Vaping Use   Vaping Use: Never used  Substance and Sexual Activity   Alcohol use: Yes    Comment: 6 oz   Drug use: No    Sexual activity: Yes    Partners: Female  Other Topics Concern   Not on file  Social History Narrative   Not on file   Social Determinants of Health   Financial Resource Strain: Not on file  Food Insecurity: Not on file  Transportation Needs: Not on file  Physical Activity: Not on file  Stress: Not on file  Social Connections: Not on file  Intimate Partner Violence: Not on file    Outpatient Medications Prior to Visit  Medication Sig Dispense Refill   Multiple Vitamins-Minerals (MULTIVITAMIN MEN 50+ PO) Take by mouth.     polyethylene glycol (MIRALAX) 17 g packet Take 17 g by mouth daily. prn constipation 14 each 0   Accu-Chek Softclix Lancets lancets 1 each by Other route as directed. Use as instructed 100 each 12   Blood Glucose Monitoring Suppl (ACCU-CHEK GUIDE) w/Device KIT 1 Device by Does not apply route as directed. 1 kit 0   glucose blood (ACCU-CHEK GUIDE) test strip Use 2-4 times daily as needed 375 each 3   Lancets Misc. (ACCU-CHEK SOFTCLIX LANCET DEV) KIT 1 Device by Does not apply route as directed. 1 kit 3   Vitamin D, Ergocalciferol, (DRISDOL) 1.25 MG (50000 UNIT) CAPS capsule 1 po q wed and 1 po q sun (Patient not taking: Reported on 10/30/2021) 8 capsule 0   atorvastatin (LIPITOR) 10 MG tablet Take 1 tablet (10 mg total) by mouth daily. (Patient not taking: Reported on 10/16/2021) 90 tablet 3   glipiZIDE (GLUCOTROL) 10 MG tablet Take 1 tablet (10 mg total) by mouth daily before breakfast. (Patient not taking: Reported on 10/16/2021) 90 tablet 1   Semaglutide (RYBELSUS) 7 MG TABS One po qd (Patient not taking: Reported on 10/16/2021) 30 tablet 0   No facility-administered medications prior to visit.    Allergies  Allergen Reactions   Metformin And Related Diarrhea    Review of Systems  Constitutional:  Negative for fever and malaise/fatigue.  HENT:  Negative for congestion, sinus pain and sore throat.   Eyes:  Negative for blurred vision.  Respiratory:  Negative for  cough, shortness of breath and wheezing.   Cardiovascular:  Negative for chest pain, palpitations and leg swelling.  Gastrointestinal:  Negative for abdominal pain, blood in stool, constipation, diarrhea, nausea and vomiting.  Genitourinary:  Negative for dysuria, frequency and hematuria.  Musculoskeletal:  Negative for falls.       (-)new muscle pain (-)new joint  pain  Skin:  Negative for rash.       (-)New moles  Neurological:  Negative for dizziness, loss of consciousness and headaches.  Endo/Heme/Allergies:  Negative for environmental allergies.  Psychiatric/Behavioral:  Positive for memory loss. Negative for depression. The patient has insomnia (mild). The patient is not nervous/anxious.        (+)mood swings       Objective:    Physical Exam Vitals and nursing note reviewed.  Constitutional:      General: He is not in acute distress.    Appearance: Normal appearance. He is not ill-appearing.  HENT:     Head: Normocephalic and atraumatic.     Right Ear: Tympanic membrane, ear canal and external ear normal.     Left Ear: Tympanic membrane, ear canal and external ear normal.  Eyes:     Extraocular Movements: Extraocular movements intact.     Pupils: Pupils are equal, round, and reactive to light.  Cardiovascular:     Rate and Rhythm: Normal rate and regular rhythm.     Pulses:          Dorsalis pedis pulses are 2+ on the right side and 2+ on the left side.       Posterior tibial pulses are 2+ on the right side and 2+ on the left side.     Heart sounds: Normal heart sounds. No murmur heard.    No gallop.  Pulmonary:     Effort: Pulmonary effort is normal. No respiratory distress.     Breath sounds: Normal breath sounds. No wheezing or rales.  Abdominal:     General: Bowel sounds are normal. There is no distension.     Palpations: Abdomen is soft.     Tenderness: There is no abdominal tenderness. There is no guarding.  Skin:    General: Skin is warm and dry.   Neurological:     Mental Status: He is alert and oriented to person, place, and time.  Psychiatric:        Judgment: Judgment normal.     BP 120/80 (BP Location: Left Arm, Patient Position: Sitting, Cuff Size: Large)   Pulse 78   Temp 97.8 F (36.6 C) (Oral)   Resp 18   Ht _0  (1.753 m)   Wt 235 lb 12.8 oz (107 kg)   SpO2 97%   BMI 34.82 kg/m  Wt Readings from Last 3 Encounters:  11/17/21 235 lb 12.8 oz (107 kg)  10/30/21 236 lb (107 kg)  10/16/21 236 lb (107 kg)    Diabetic Foot Exam - Simple   No data filed    Lab Results  Component Value Date   WBC 3.6 (L) 11/17/2021   HGB 13.8 11/17/2021   HCT 41.9 11/17/2021   PLT 205.0 11/17/2021   GLUCOSE 163 (H) 11/17/2021   CHOL 194 11/17/2021   TRIG 91.0 11/17/2021   HDL 58.30 11/17/2021   LDLDIRECT 114.4 08/24/2006   LDLCALC 117 (H) 11/17/2021   ALT 11 11/17/2021   AST 16 11/17/2021   NA 137 11/17/2021   K 4.2 11/17/2021   CL 102 11/17/2021   CREATININE 1.01 11/17/2021   BUN 19 11/17/2021   CO2 26 11/17/2021   TSH 1.110 12/27/2019   PSA 0.86 11/17/2021   HGBA1C 10.4 (H) 11/17/2021   MICROALBUR 0.9 11/17/2021    Lab Results  Component Value Date   TSH 1.110 12/27/2019   Lab Results  Component Value Date   WBC 3.6 (L) 11/17/2021  HGB 13.8 11/17/2021   HCT 41.9 11/17/2021   MCV 86.2 11/17/2021   PLT 205.0 11/17/2021   Lab Results  Component Value Date   NA 137 11/17/2021   K 4.2 11/17/2021   CO2 26 11/17/2021   GLUCOSE 163 (H) 11/17/2021   BUN 19 11/17/2021   CREATININE 1.01 11/17/2021   BILITOT 1.3 (H) 11/17/2021   ALKPHOS 54 11/17/2021   AST 16 11/17/2021   ALT 11 11/17/2021   PROT 7.2 11/17/2021   ALBUMIN 4.2 11/17/2021   CALCIUM 9.2 11/17/2021   GFR 73.45 11/17/2021   Lab Results  Component Value Date   CHOL 194 11/17/2021   Lab Results  Component Value Date   HDL 58.30 11/17/2021   Lab Results  Component Value Date   LDLCALC 117 (H) 11/17/2021   Lab Results  Component  Value Date   TRIG 91.0 11/17/2021   Lab Results  Component Value Date   CHOLHDL 3 11/17/2021   Lab Results  Component Value Date   HGBA1C 10.4 (H) 11/17/2021   Colonoscopy: Last competed 10/21/2021. Results showed: - Mild diverticulosis in the left colon. - Internal hemorrhoids. - The examination was otherwise normal on direct and retroflexion views. - No specimens collected. - No repeat colonoscopy due to age and the absence of colonic polyps.      Assessment & Plan:   Problem List Items Addressed This Visit       Unprioritized   Hyperlipidemia LDL goal <70   Hyperlipidemia associated with type 2 diabetes mellitus ([place])   Relevant Orders   CBC with Differential/Platelet (Completed)   Comprehensive metabolic panel (Completed)   Hemoglobin A1c (Completed)   Lipid panel (Completed)   Microalbumin / creatinine urine ratio (Completed)   PSA (Completed)   Vitamin D deficiency    Check labs       Type 2 diabetes mellitus without complication, without long-term current use of insulin ([place])    Check labs  hgba1c to be checked , minimize simple carbs. Increase exercise as tolerated. Continue current meds       OSA (obstructive sleep apnea)    Refer to pulm        Relevant Orders   Ambulatory referral to Pulmonology   MEMORY LOSS    Refer to neuropsych      Relevant Orders   Ambulatory referral to Neuropsychology   Bilateral hearing loss    Refer to audiology      Relevant Orders   Ambulatory referral to Audiology   Other Visit Diagnoses     Class 1 obesity with serious comorbidity and body mass index (BMI) of 32.0 to 32.9 in adult, unspecified obesity type    -  Primary   Relevant Orders   CBC with Differential/Platelet (Completed)   Comprehensive metabolic panel (Completed)   Hemoglobin A1c (Completed)   Lipid panel (Completed)   Microalbumin / creatinine urine ratio (Completed)   PSA (Completed)   Uncontrolled type 2 diabetes mellitus with  hyperglycemia ([place])       Relevant Orders   CBC with Differential/Platelet (Completed)   Comprehensive metabolic panel (Completed)   Hemoglobin A1c (Completed)   Lipid panel (Completed)   Microalbumin / creatinine urine ratio (Completed)   PSA (Completed)   Need for shingles vaccine       Relevant Orders   Zoster Recombinant (Shingrix ) (Completed)        No orders of the defined types were placed in this encounter.   Regis Bill  Carollee Herter, DO, personally preformed the services described in this documentation.  All medical record entries made by the scribe were at my direction and in my presence.  I have reviewed the chart and discharge instructions (if applicable) and agree that the record reflects my personal performance and is accurate and complete. 11/17/2021   I,Calvin Chandler,acting as a scribe for Ann Held, DO.,have documented all relevant documentation on the behalf of Ann Held, DO,as directed by  Ann Held, DO while in the presence of Ann Held, DO.   Ann Held, DO

## 2021-11-17 NOTE — Patient Instructions (Signed)

## 2021-11-18 ENCOUNTER — Encounter (INDEPENDENT_AMBULATORY_CARE_PROVIDER_SITE_OTHER): Payer: Self-pay

## 2021-11-21 ENCOUNTER — Other Ambulatory Visit: Payer: Self-pay | Admitting: Family Medicine

## 2021-11-21 ENCOUNTER — Encounter: Payer: Self-pay | Admitting: Family Medicine

## 2021-11-21 DIAGNOSIS — E1165 Type 2 diabetes mellitus with hyperglycemia: Secondary | ICD-10-CM

## 2021-11-21 DIAGNOSIS — E1169 Type 2 diabetes mellitus with other specified complication: Secondary | ICD-10-CM

## 2021-11-21 DIAGNOSIS — H9193 Unspecified hearing loss, bilateral: Secondary | ICD-10-CM | POA: Insufficient documentation

## 2021-11-21 DIAGNOSIS — E669 Obesity, unspecified: Secondary | ICD-10-CM

## 2021-11-21 HISTORY — DX: Unspecified hearing loss, bilateral: H91.93

## 2021-11-21 MED ORDER — SEMAGLUTIDE(0.25 OR 0.5MG/DOS) 2 MG/3ML ~~LOC~~ SOPN: PEN_INJECTOR | SUBCUTANEOUS | 1 refills | Status: DC

## 2021-11-21 MED ORDER — ATORVASTATIN CALCIUM 10 MG PO TABS
10.0000 mg | ORAL_TABLET | Freq: Every day | ORAL | 3 refills | Status: DC
Start: 2021-11-21 — End: 2024-02-24

## 2021-11-21 NOTE — Assessment & Plan Note (Signed)
Check labs  hgba1c to be checked, minimize simple carbs. Increase exercise as tolerated. Continue current meds  

## 2021-11-21 NOTE — Assessment & Plan Note (Signed)
Refer to audiology.

## 2021-11-21 NOTE — Assessment & Plan Note (Signed)
Refer to pulm

## 2021-11-21 NOTE — Assessment & Plan Note (Signed)
Check labs 

## 2021-11-21 NOTE — Assessment & Plan Note (Signed)
Refer to neuropsych 

## 2021-11-24 ENCOUNTER — Other Ambulatory Visit: Payer: Self-pay | Admitting: *Deleted

## 2021-11-24 MED ORDER — SEMAGLUTIDE(0.25 OR 0.5MG/DOS) 2 MG/3ML ~~LOC~~ SOPN: 0.2500 mg | PEN_INJECTOR | SUBCUTANEOUS | 1 refills | Status: DC

## 2021-11-30 ENCOUNTER — Other Ambulatory Visit (INDEPENDENT_AMBULATORY_CARE_PROVIDER_SITE_OTHER): Payer: BC Managed Care – PPO

## 2021-11-30 ENCOUNTER — Other Ambulatory Visit: Payer: Self-pay

## 2021-11-30 ENCOUNTER — Encounter: Payer: Self-pay | Admitting: Physician Assistant

## 2021-11-30 ENCOUNTER — Ambulatory Visit (INDEPENDENT_AMBULATORY_CARE_PROVIDER_SITE_OTHER): Payer: BC Managed Care – PPO | Admitting: Physician Assistant

## 2021-11-30 VITALS — BP 132/80 | HR 77 | Resp 18 | Ht 69.0 in | Wt 239.0 lb

## 2021-11-30 DIAGNOSIS — R413 Other amnesia: Secondary | ICD-10-CM

## 2021-11-30 DIAGNOSIS — R4182 Altered mental status, unspecified: Secondary | ICD-10-CM

## 2021-11-30 LAB — VITAMIN B12: Vitamin B-12: 210 pg/mL — ABNORMAL LOW (ref 211–911)

## 2021-11-30 LAB — TSH: TSH: 1.09 u[IU]/mL (ref 0.35–5.50)

## 2021-11-30 NOTE — Progress Notes (Signed)
  Assessment/Plan:    The patient is seen in neurologic consultation at the request of Lowne Chase, Yvonne R, * for the evaluation of memory.  Calvin Chandler is a very pleasant 76 y.o. year old RH male with a history of hypertension, hyperlipidemia, DM2, OSA, bilateral hearing loss, vitamin D deficiency, anxiety, depression, Jehovah's Witness seen today for evaluation of memory loss. MoCA today is  24/30 with delayed recall 4/5 and visuospatial 4/5   Mild Cognitive Impairment   MRI brain without contrast to assess for underlying structural abnormality and assess vascular load  Neurocognitive testing to further evaluate cognitive concerns and determine other underlying cause of memory changes, including potential contribution from sleep, anxiety, or depression  Check B12, TSH EEG rule out seizure  Folllow up in 1 month      Subjective:    The patient is accompanied by his wife who supplements the history.    How long did patient have memory difficulties? "My wife and children said I have problems". Wife reports for about 3 months his short term memory is worse. He forgets appointments and engagements.  He forgets recent conversation and recently he has been angry easily. No issues with long term memory  Patient lives with: Spouse   repeats oneself? Endorsed "quite a bit" Disoriented when walking into a room?  Patient denies   Leaving objects in unusual places?  Patient denies   Ambulates  with difficulty?   Patient denies  knees and ankles bother him due to arthritis, but still walks, short distances though.  Recent falls?  Patient denies   Any head injuries?  Patient denies   History of seizures?   Patient denies   Wandering behavior?  Patient denies   Patient drives?  Denies any issues but wife says that he forgets where he is going sometimes. Sometime she acts like he is a daze when driving.    Any mood changes? Irritability is more pronounced "He is unable to control it as  before". No reason to be upset, but he has bouts of anger". This is present for a year. He is not on mood medications .  Any history of depression?: Endorsed, for the last 12 yrs. At one point counseling was recommended but did no like the medication that the therapist gave him and he stopped it  Hallucinations?  Patient denies   Paranoia?  Patient denies   Patient reports that he sleeps well without vivid dreams, REM behavior or sleepwalking. He wakes up to urinate and goes back to sleep  History of sleep apnea? Stopped using CPAP before Covid  Any hygiene concerns?  Patient denies . Stays in the bathroom or a long time up to 1 h "unclear what he does there" Independent of bathing and dressing?  Endorsed  Does the patient needs help with medications? Patient in charge  Who is in charge of the finances?  Patient is in charge   Any changes in appetite?  Patient denies   Patient have trouble swallowing? Patient denies   Does the patient cook?  Occasionally  Any kitchen accidents such as leaving the stove on? Patient denies   Any headaches?  Patient denies   Double vision? Patient denies   Any focal numbness or tingling?  Patient denies   Chronic back pain Patient denies   Unilateral weakness?  Patient denies   Any tremors?  Patient denies   Any history of anosmia?  Patient denies   Any incontinence of urine?Endorsed, wears   depends  Any bowel dysfunction?   He denies.  History of heavy alcohol intake? Drinks 8 oz wine a night  History of heavy tobacco use?  Patient denies   Family history of dementia? Mother died 95 dementia  School bus driver    Allergies  Allergen Reactions   Metformin And Related Diarrhea    Current Outpatient Medications  Medication Instructions   Accu-Chek Softclix Lancets lancets 1 each, Other, As directed, Use as instructed   atorvastatin (LIPITOR) 10 mg, Oral, Daily   Blood Glucose Monitoring Suppl (ACCU-CHEK GUIDE) w/Device KIT 1 Device, Does not apply, As  directed   glucose blood (ACCU-CHEK GUIDE) test strip Use 2-4 times daily as needed   Lancets Misc. (ACCU-CHEK SOFTCLIX LANCET DEV) KIT 1 Device, Does not apply, As directed   Multiple Vitamins-Minerals (MULTIVITAMIN MEN 50+ PO) Oral   polyethylene glycol (MIRALAX) 17 g, Oral, Daily, prn constipation   Semaglutide(0.25 or 0.5MG/DOS) 0.25 mg, Subcutaneous, Weekly, Increase to 0.5mg in one month.   Semaglutide,0.25 or 0.5MG/DOS, 2 MG/3ML SOPN O.25 mg sq weekly x 1 month then increase to 0.5 ng weekly   Vitamin D, Ergocalciferol, (DRISDOL) 1.25 MG (50000 UNIT) CAPS capsule 1 po q wed and 1 po q sun     VITALS:   Vitals:   11/30/21 1004  BP: 132/80  Pulse: 77  Resp: 18  SpO2: 97%  Weight: 239 lb (108.4 kg)  Height: 5' 9" (1.753 m)    PHYSICAL EXAM   HEENT:  Normocephalic, atraumatic. The mucous membranes are moist. The superficial temporal arteries are without ropiness or tenderness. Cardiovascular: Regular rate and rhythm. Lungs: Clear to auscultation bilaterally. Neck: There are no carotid bruits noted bilaterally.  NEUROLOGICAL:    11/30/2021   12:00 PM  Montreal Cognitive Assessment   Visuospatial/ Executive (0/5) 4  Naming (0/3) 3  Attention: Read list of digits (0/2) 2  Attention: Read list of letters (0/1) 1  Attention: Serial 7 subtraction starting at 100 (0/3) 3  Language: Repeat phrase (0/2) 0  Language : Fluency (0/1) 1  Abstraction (0/2) 0  Delayed Recall (0/5) 4  Orientation (0/6) 6  Total 24  Adjusted Score (based on education) 24        No data to display           Orientation:  Alert and oriented to person, place and time. No aphasia or dysarthria. Fund of knowledge is appropriate. Recent memory impaired and remote memory intact.  Attention and concentration are normal.  Able to name objects and repeat phrases. Delayed recall  4/6/ Cranial nerves: There is good facial symmetry. Extraocular muscles are intact and visual fields are full to  confrontational testing. Speech is fluent and clear. Soft palate rises symmetrically and there is no tongue deviation. Hearing is intact to conversational tone. Tone: Tone is good throughout. Sensation: Sensation is intact to light touch and pinprick throughout. Vibration is intact at the bilateral big toe.There is no extinction with double simultaneous stimulation. There is no sensory dermatomal level identified. Coordination: The patient has no difficulty with RAM's or FNF bilaterally. Normal finger to nose  Motor: Strength is 5/5 in the bilateral upper and lower extremities. There is no pronator drift. There are no fasciculations noted. DTR's: Deep tendon reflexes are 2/4 at the bilateral biceps, triceps, brachioradialis, patella and achilles.  Plantar responses are downgoing bilaterally. Gait and Station: The patient is able to ambulate without difficulty.The patient is able to heel toe walk without any difficulty.The patient   is able to ambulate in a tandem fashion. The patient is able to stand in the Romberg position.     Thank you for allowing [place] the opportunity to participate in the care of this nice patient. Please do not hesitate to contact us for any questions or concerns.   Total time spent on today's visit was 50  minutes dedicated to this patient today, preparing to see patient, examining the patient, ordering tests and/or medications and counseling the patient, documenting clinical information in the EHR or other health record, independently interpreting results and communicating results to the patient/family, discussing treatment and goals, answering patient's questions and coordinating care.  Cc:  Algis Liming [facility] 11/30/2021 12:35 PM

## 2021-11-30 NOTE — Patient Instructions (Signed)
We have sent a referral to [facility] for your MRI and they will call you directly to schedule your appointment. They are located at [place]. If you need to contact them directly please call [phone].  Your provider has requested that you have labwork completed today. Please go to [facility] ([id number]) on the second floor of this building before leaving the office today. You do not need to check in. If you are not called within 15 minutes please check with the front desk.

## 2021-11-30 NOTE — Progress Notes (Signed)
B12 is low, needs to start 1000 mcg daily of B12 and follow up with primary doctor . Thyroid levels are normal

## 2021-12-03 ENCOUNTER — Encounter: Payer: Self-pay | Admitting: Family Medicine

## 2021-12-09 ENCOUNTER — Ambulatory Visit
Admission: RE | Admit: 2021-12-09 | Discharge: 2021-12-09 | Disposition: A | Discharge status: Home / Self Care | Payer: BC Managed Care – PPO | Source: Ambulatory Visit | Attending: Physician Assistant | Admitting: Physician Assistant

## 2021-12-11 NOTE — Progress Notes (Signed)
MRI head shows some chronic changes in the vessels and some atrophy in the brain but no acute findings.

## 2021-12-17 IMAGING — DX DG SHOULDER 2+V*R*
4 series · 4 of 4 positions shown · non-contrast
Comparison: None.

CLINICAL DATA: Recent fall with right shoulder pain, initial
encounter

EXAM:
RIGHT SHOULDER - 2+ VIEW

[shoulder ap]
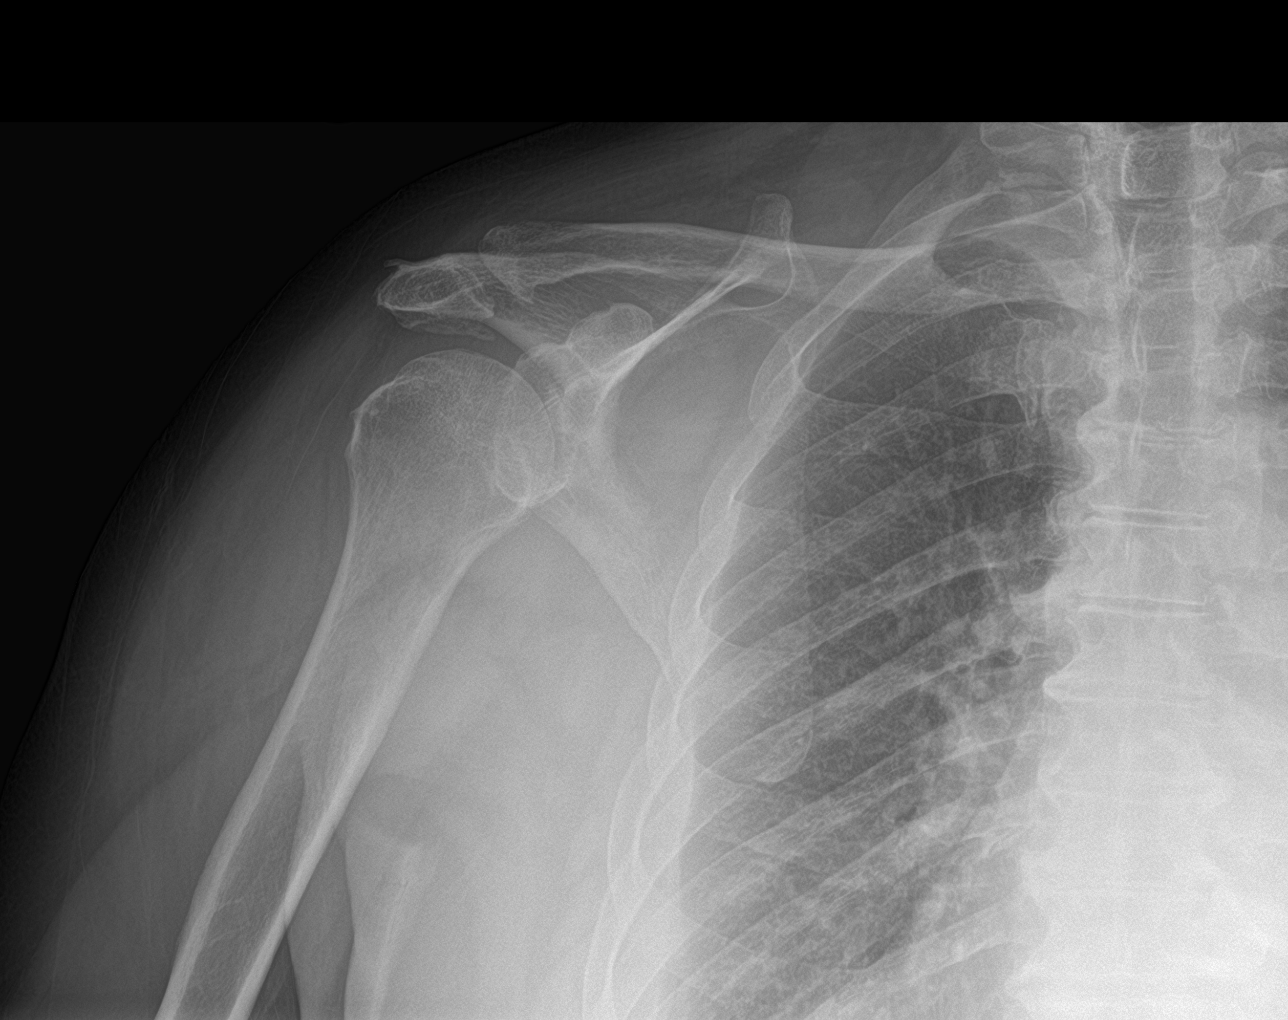

[shoulder grashey]
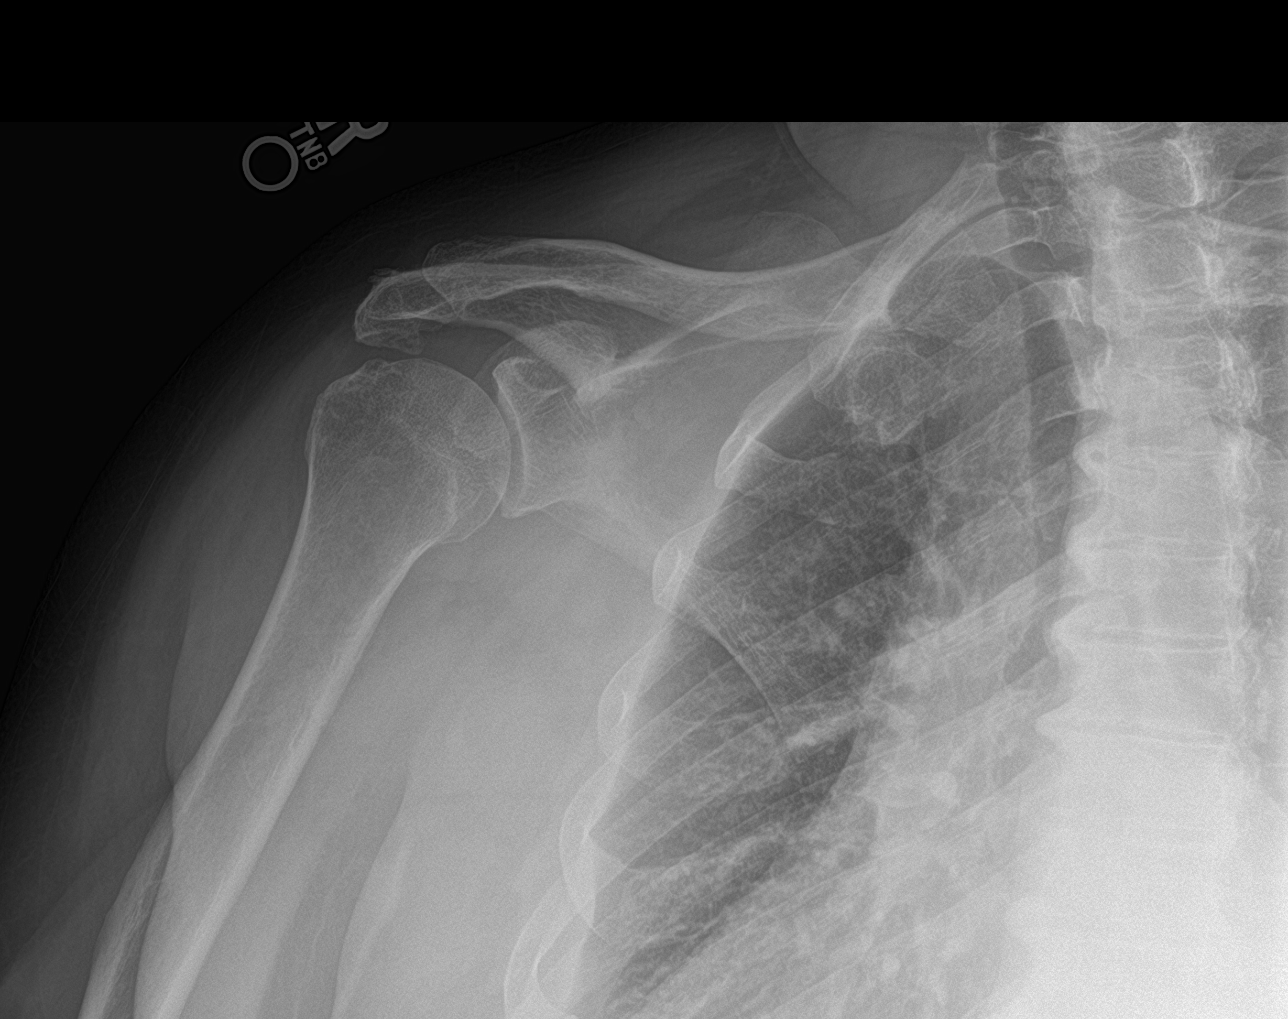

[shoulder y-view]
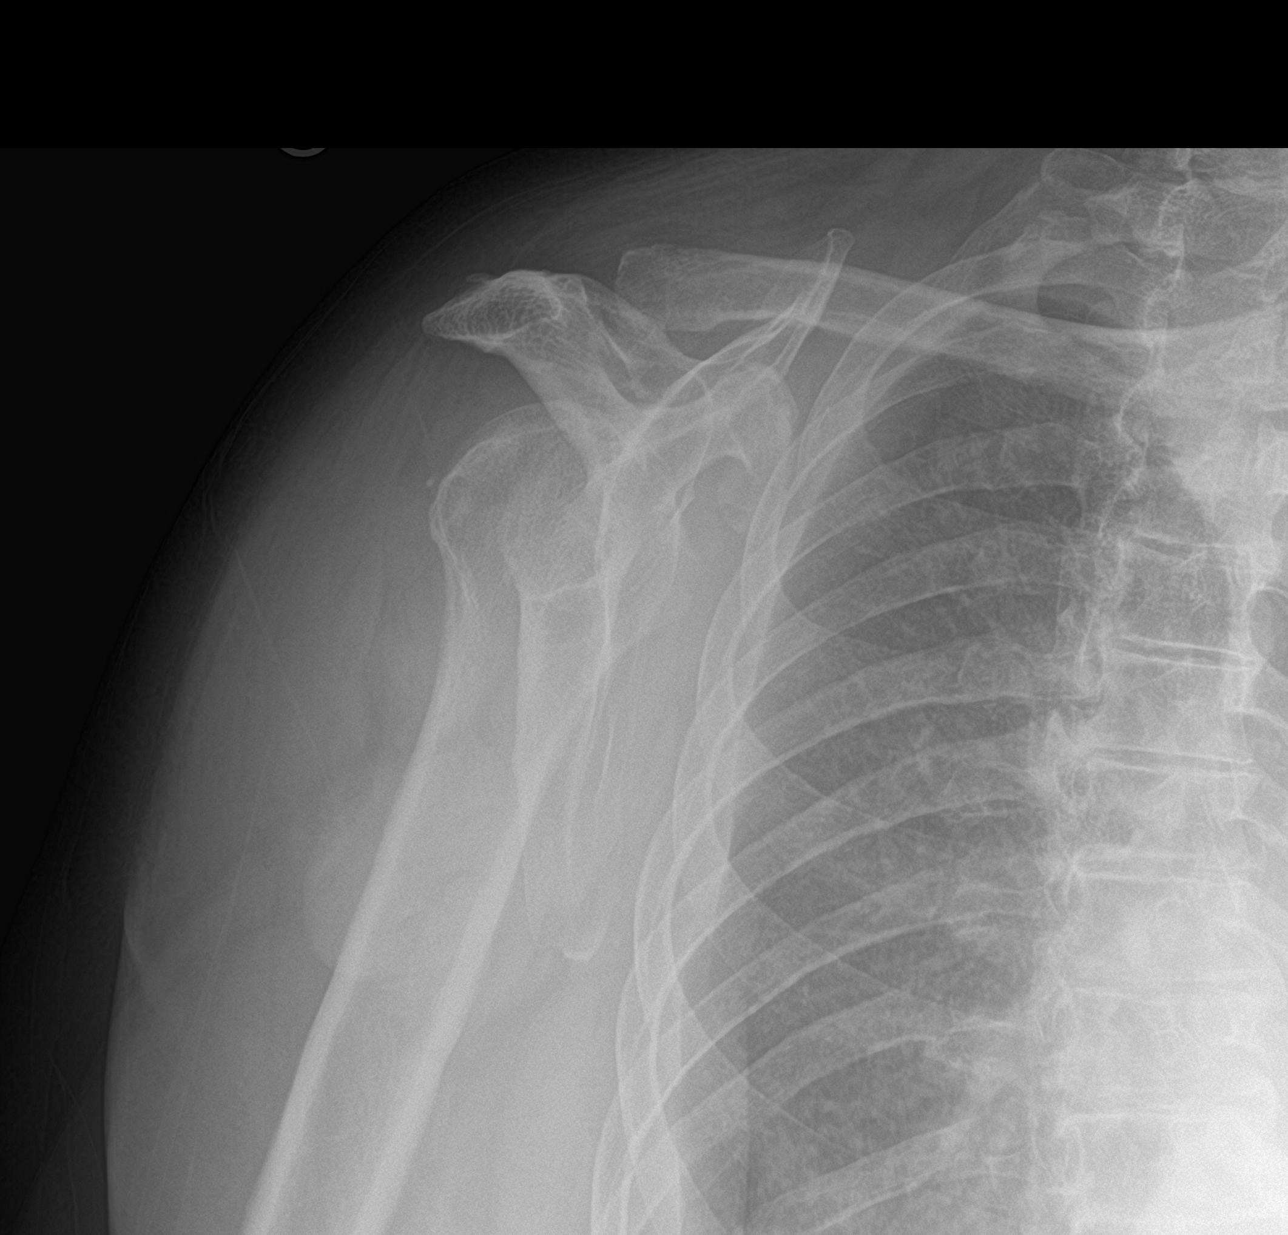

[shoulder axial]
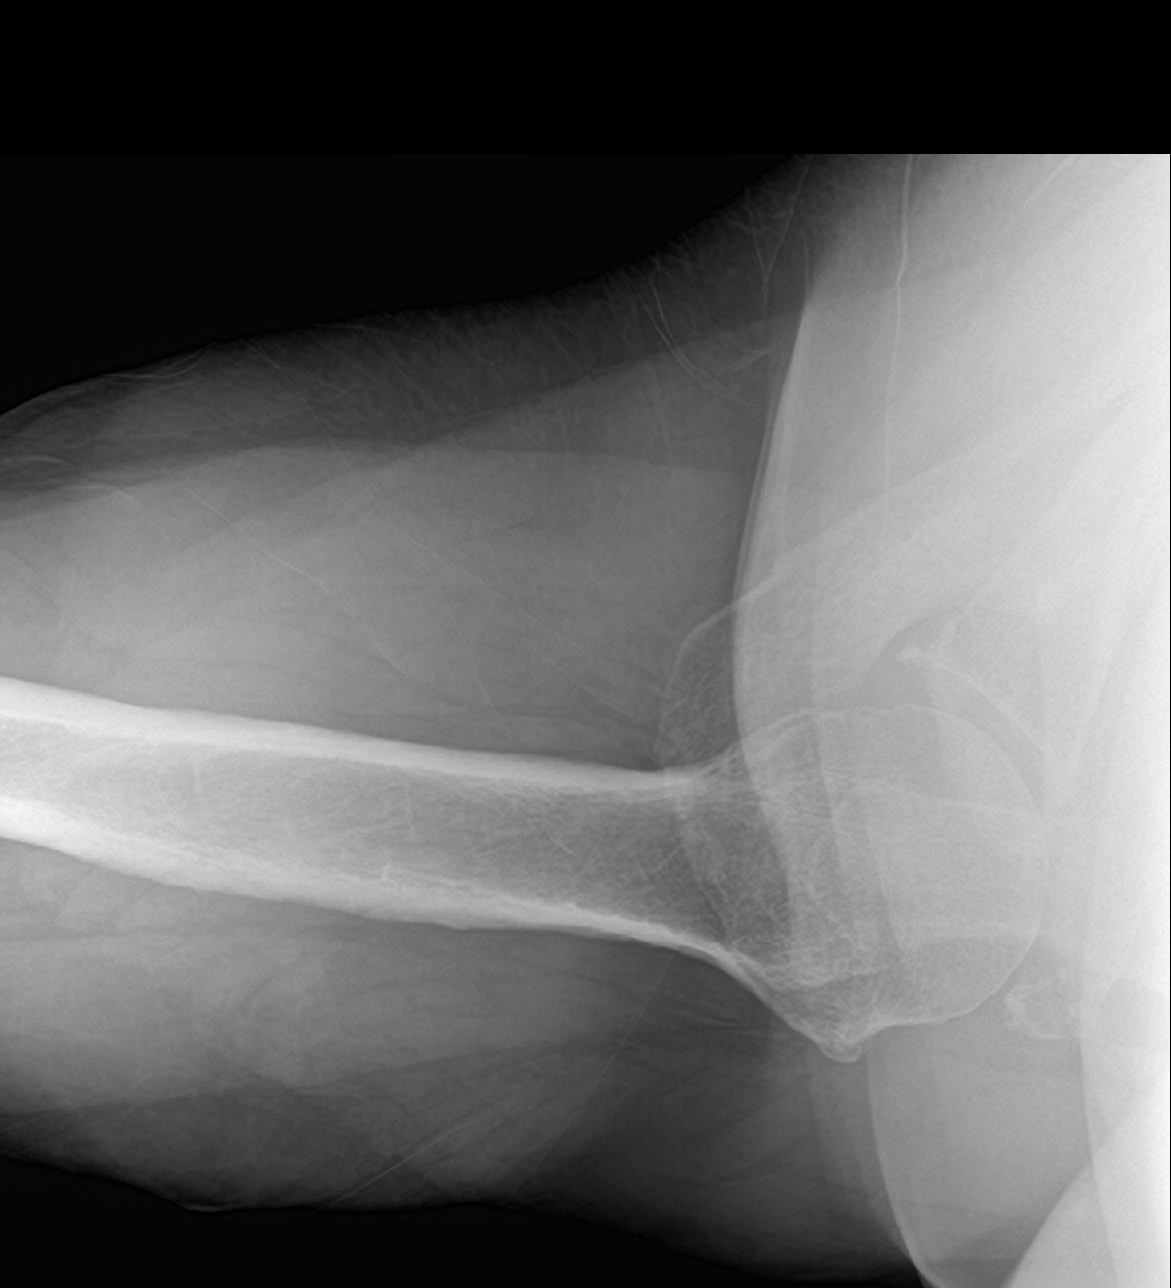

[4 of 4 positions shown; findings below may reference images not displayed]

FINDINGS: Degenerative changes of the acromioclavicular joint noted. No acute
fracture or dislocation is seen. Inferior spurring at the acromion
is noted which may cause some impingement symptoms. No acute soft
tissue abnormality is seen.
IMPRESSION: Degenerative change without acute abnormality.

## 2021-12-17 IMAGING — DX DG ELBOW COMPLETE 3+V*R*
4 series · 4 of 4 positions shown · non-contrast
Comparison: None.

CLINICAL DATA: Recent fall with elbow pain, initial encounter

EXAM:
RIGHT ELBOW - COMPLETE 3+ VIEW

[elbow ap]
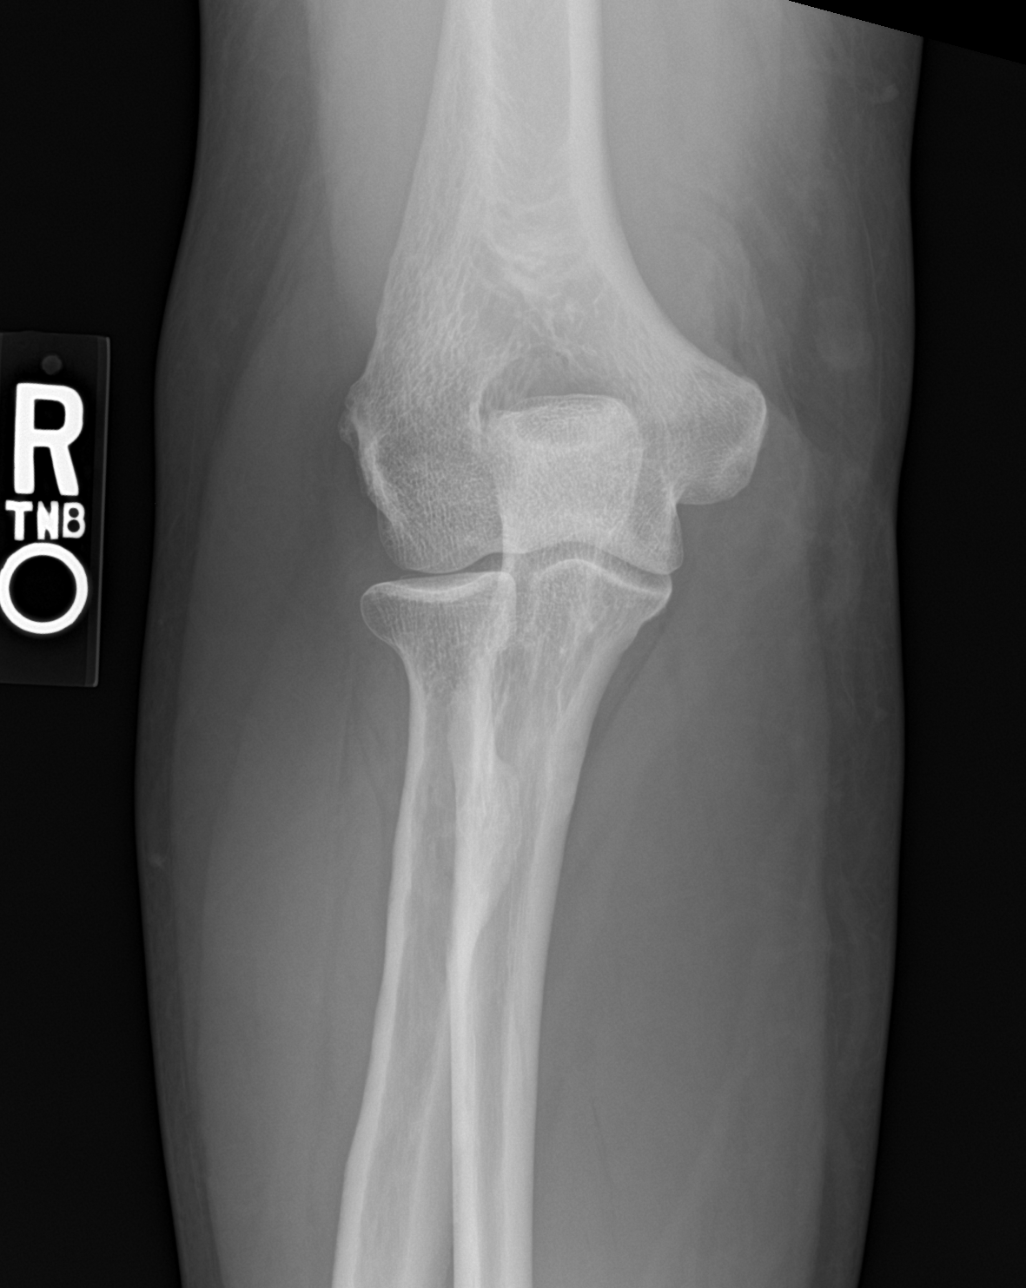

[elbow obl (1 of 2)]
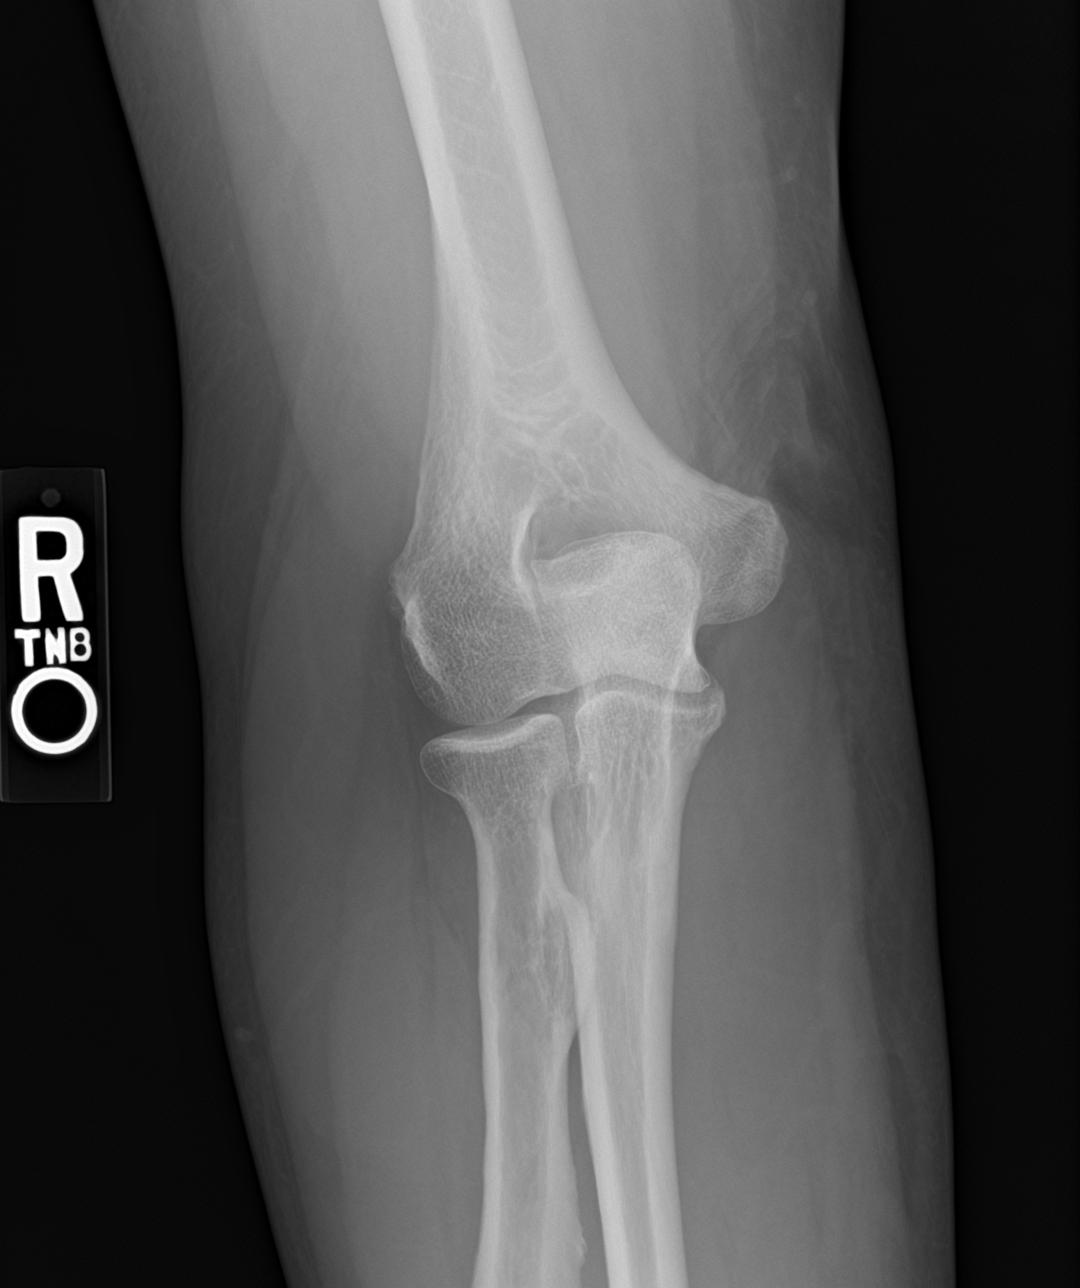

[elbow obl (2 of 2)]
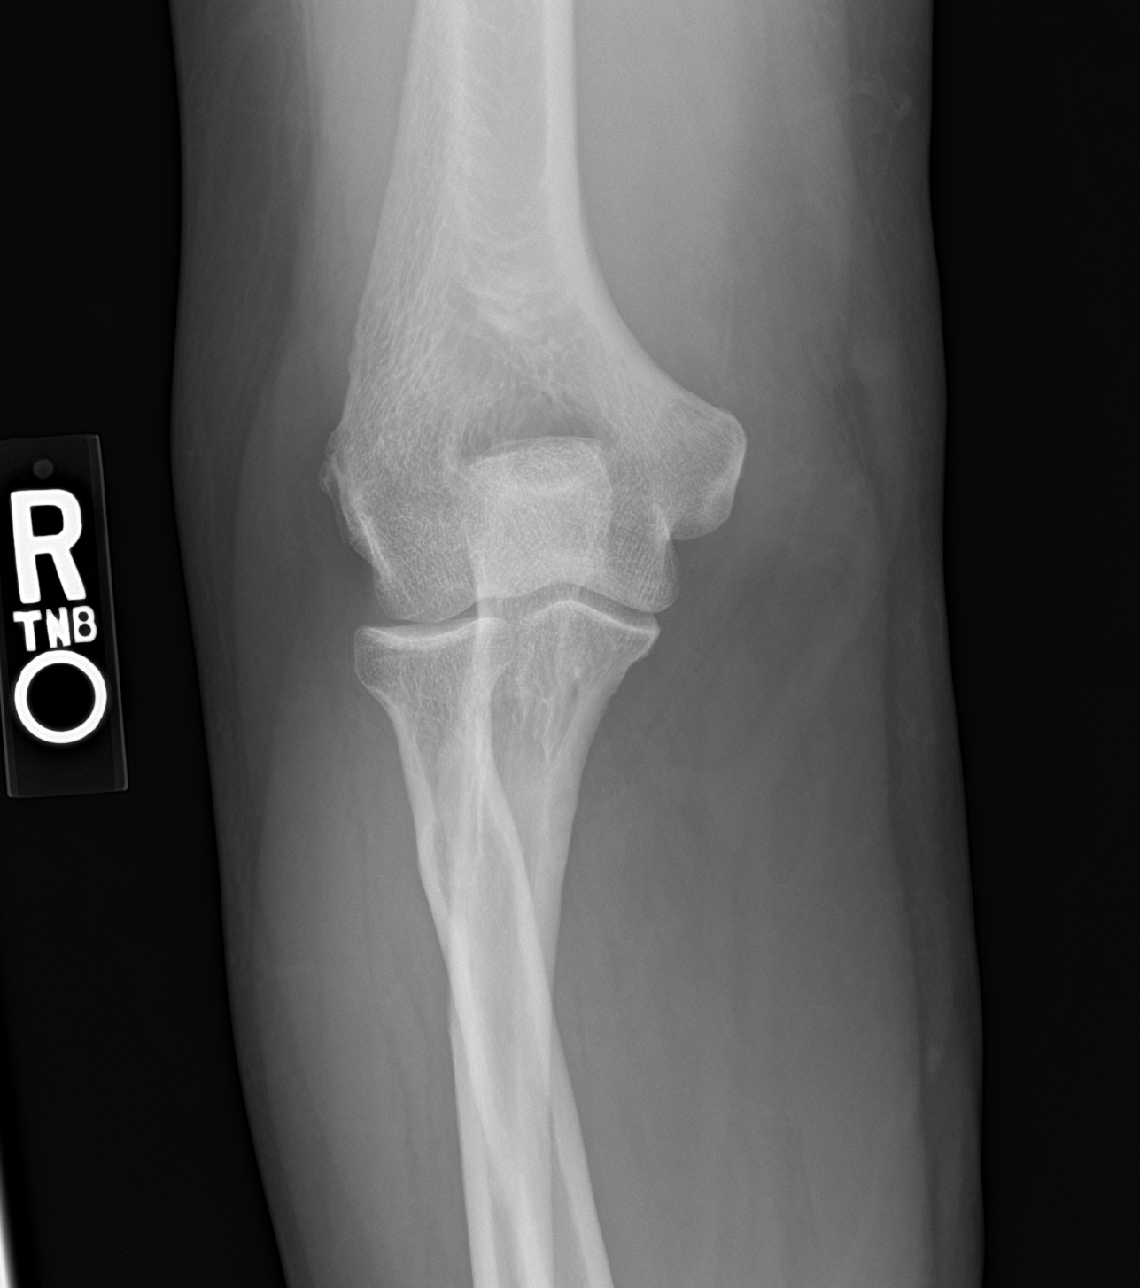

[elbow lat]
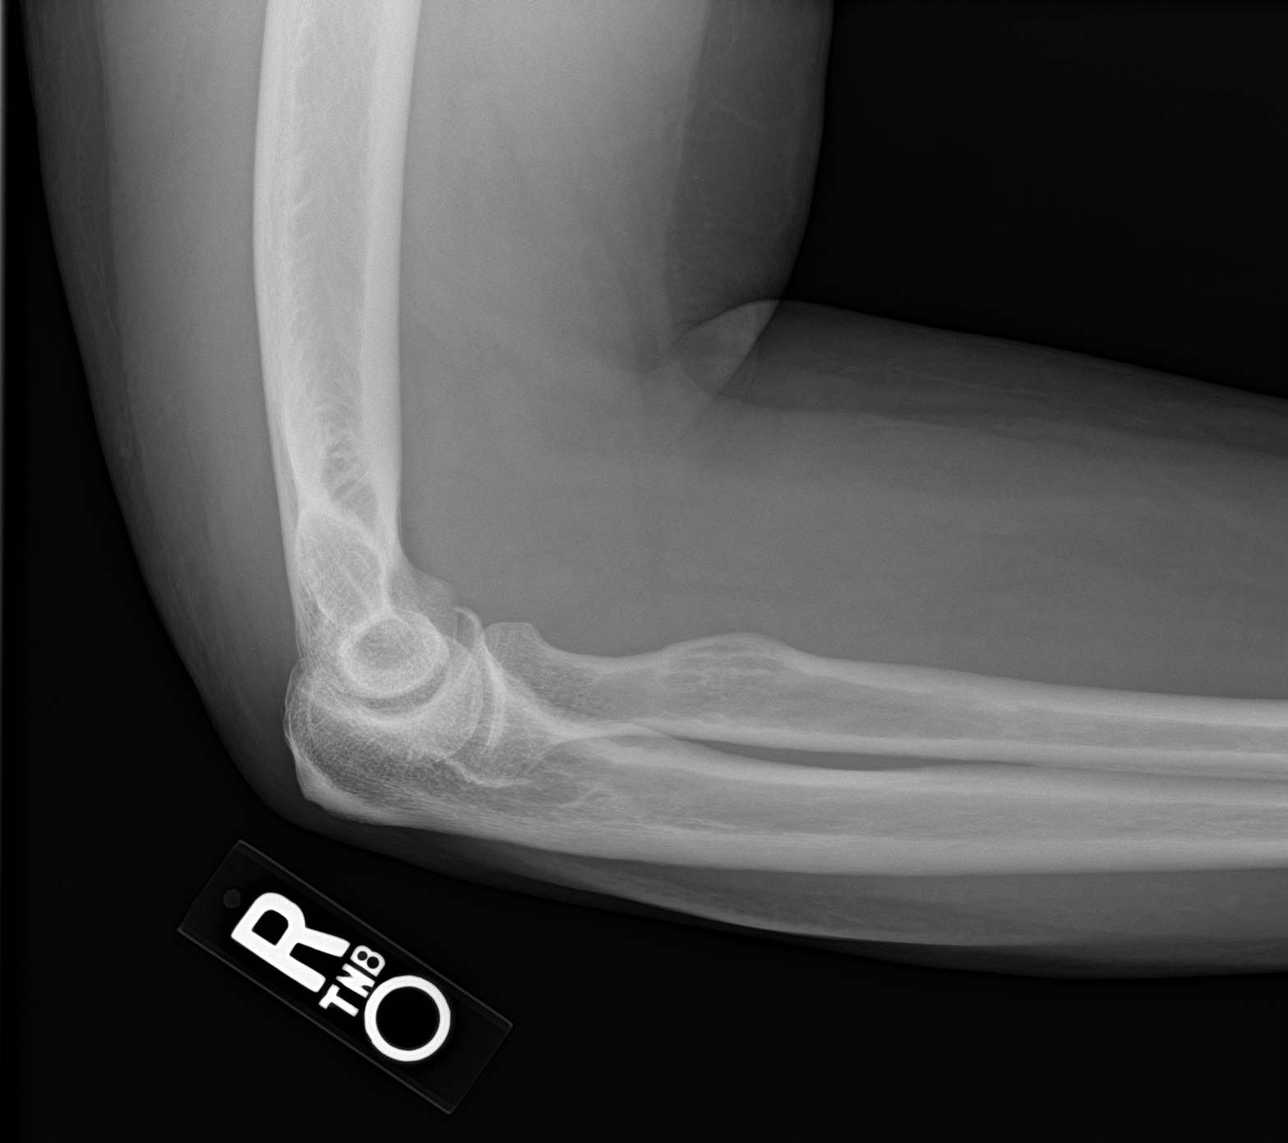

[4 of 4 positions shown; findings below may reference images not displayed]

FINDINGS: There is no evidence of fracture, dislocation, or joint effusion.
There is no evidence of arthropathy or other focal bone abnormality.
Soft tissues are unremarkable.
IMPRESSION: No acute abnormality noted.

## 2021-12-23 ENCOUNTER — Other Ambulatory Visit: Payer: Self-pay

## 2021-12-23 DIAGNOSIS — R4182 Altered mental status, unspecified: Secondary | ICD-10-CM

## 2022-01-06 ENCOUNTER — Encounter: Payer: Self-pay | Admitting: Physician Assistant

## 2022-01-06 ENCOUNTER — Ambulatory Visit (INDEPENDENT_AMBULATORY_CARE_PROVIDER_SITE_OTHER): Payer: MEDICARE | Admitting: Physician Assistant

## 2022-01-06 VITALS — BP 126/86 | HR 84 | Resp 20 | Ht 69.0 in | Wt 239.0 lb

## 2022-01-06 DIAGNOSIS — G3184 Mild cognitive impairment, so stated: Secondary | ICD-10-CM

## 2022-01-06 MED ORDER — DONEPEZIL HCL 5 MG PO TABS: 5.0000 mg | ORAL_TABLET | Freq: Every day | ORAL | 11 refills | Status: AC

## 2022-01-06 NOTE — Patient Instructions (Signed)
It was a pleasure to see you today at our office.   Recommendations:    Start donepezil 5 mg daily. Side effects were discussed  Follow up in  6 months. Neuropsych testing for clarity of diagnosis and disease trajectory is scheduled for 09/02/22 Recommend good control of cardiovascular risk factors.    Recommend B12 supplement daily  Resume CPAP for sleep apnea Monitor the glucose levels (last A1C 10)    Whom to call:  Memory  decline, memory medications: Call our office [phone]   For psychiatric meds, mood meds: Please have your primary care physician manage these medications.   Counseling regarding caregiver distress, including caregiver depression, anxiety and issues regarding community resources, adult day care programs, adult living facilities, or memory care questions:   Feel free to contact [place], Social Worker at [phone]   For assessment of decision of mental capacity and competency:  Call Dr. Anthoney Harada, geriatric psychiatrist at [phone]  For guidance in geriatric dementia issues please call [facility] [phone]  For guidance regarding [facility] and if placement were needed at the facility, contact Arnell Asal, Social Worker tel: [phone]  If you have any severe symptoms of a stroke, or other severe issues such as confusion,severe chills or fever, etc call 911 or go to the ER as you may need to be evaluated further   Feel free to visit Facebook page " [email or web address]" for tips of how to care for people with memory problems.      RECOMMENDATIONS FOR ALL PATIENTS WITH MEMORY PROBLEMS: 1. Continue to exercise (Recommend 30 minutes of walking everyday, or 3 hours every week) 2. Increase social interactions - continue going to [place] and enjoy social gatherings with friends and family 3. Eat healthy, avoid fried foods and eat more fruits and vegetables 4. Maintain adequate blood pressure, blood sugar, and  blood cholesterol level. Reducing the risk of stroke and cardiovascular disease also helps promoting better memory. 5. Avoid stressful situations. Live a simple life and avoid aggravations. Organize your time and prepare for the next day in anticipation. 6. Sleep well, avoid any interruptions of sleep and avoid any distractions in the bedroom that may interfere with adequate sleep quality 7. Avoid sugar, avoid sweets as there is a strong link between excessive sugar intake, diabetes, and cognitive impairment We discussed the Mediterranean diet, which has been shown to help patients reduce the risk of progressive memory disorders and reduces cardiovascular risk. This includes eating fish, eat fruits and green leafy vegetables, nuts like almonds and hazelnuts, walnuts, and also use olive oil. Avoid fast foods and fried foods as much as possible. Avoid sweets and sugar as sugar use has been linked to worsening of memory function.  There is always a concern of gradual progression of memory problems. If this is the case, then we may need to adjust level of care according to patient needs. Support, both to the patient and caregiver, should then be put into place.    The Alzheimer's Association is here all day, every day for people facing Alzheimer's disease through our free 24/7 Helpline: [phone]. The Helpline provides reliable information and support to all those who need assistance, such as individuals living with memory loss, Alzheimer's or other dementia, caregivers, health care professionals and the public.  Our highly trained and knowledgeable staff can help you with: Understanding memory loss, dementia and Alzheimer's  Medications and other treatment options  General information about aging and brain health  Skills to provide quality care and to find the best care from professionals  Legal, financial and living-arrangement decisions Our Helpline also features: Confidential care consultation  provided by master's level clinicians who can help with decision-making support, crisis assistance and education on issues families face every day  Help in a caller's preferred language using our translation service that features more than 200 languages and dialects  Referrals to local community programs, services and ongoing support     FALL PRECAUTIONS: Be cautious when walking. Scan the area for obstacles that may increase the risk of trips and falls. When getting up in the mornings, sit up at the edge of the bed for a few minutes before getting out of bed. Consider elevating the bed at the head end to avoid drop of blood pressure when getting up. Walk always in a well-lit room (use night lights in the walls). Avoid area rugs or power cords from appliances in the middle of the walkways. Use a walker or a cane if necessary and consider physical therapy for balance exercise. Get your eyesight checked regularly.  FINANCIAL OVERSIGHT: Supervision, especially oversight when making financial decisions or transactions is also recommended.  HOME SAFETY: Consider the safety of the kitchen when operating appliances like stoves, microwave oven, and blender. Consider having supervision and share cooking responsibilities until no longer able to participate in those. Accidents with firearms and other hazards in the house should be identified and addressed as well.   ABILITY TO BE LEFT ALONE: If patient is unable to contact 911 operator, consider using LifeLine, or when the need is there, arrange for someone to stay with patients. Smoking is a fire hazard, consider supervision or cessation. Risk of wandering should be assessed by caregiver and if detected at any point, supervision and safe proof recommendations should be instituted.  MEDICATION SUPERVISION: Inability to self-administer medication needs to be constantly addressed. Implement a mechanism to ensure safe administration of the  medications.   DRIVING: Regarding driving, in patients with progressive memory problems, driving will be impaired. We advise to have someone else do the driving if trouble finding directions or if minor accidents are reported. Independent driving assessment is available to determine safety of driving.   If you are interested in the driving assessment, you can contact the following:  [employer] in [place]  [employer] [place] [place] [phone] or [phone]      [place] refers to food and lifestyle choices that are based on the traditions of countries located on the [employer]. This way of eating has been shown to help prevent certain conditions and improve outcomes for people who have chronic diseases, like kidney disease and heart disease. What are tips for following this plan? Lifestyle  Cook and eat meals together with your family, when possible. Drink enough fluid to keep your urine clear or pale yellow. Be physically active every day. This includes: Aerobic exercise like running or swimming. Leisure activities like gardening, walking, or housework. Get 7-8 hours of sleep each night. If recommended by your health care provider, drink red wine in moderation. This means 1 glass a day for nonpregnant women and 2 glasses a day for men. A glass of wine equals 5 oz (150 mL). Reading food labels  Check the serving size of packaged foods. For foods such as rice and pasta, the serving size refers to the amount of cooked product, not dry. Check  the total fat in packaged foods. Avoid foods that have saturated fat or trans fats. Check the ingredients list for added sugars, such as corn syrup. Shopping  At the grocery store, buy most of your food from the areas near the walls of the store. This includes: Fresh fruits and vegetables  (produce). Grains, beans, nuts, and seeds. Some of these may be available in unpackaged forms or large amounts (in bulk). Fresh seafood. Poultry and eggs. Low-fat dairy products. Buy whole ingredients instead of prepackaged foods. Buy fresh fruits and vegetables in-season from local farmers markets. Buy frozen fruits and vegetables in resealable bags. If you do not have access to quality fresh seafood, buy precooked frozen shrimp or canned fish, such as tuna, salmon, or sardines. Buy small amounts of raw or cooked vegetables, salads, or olives from the deli or salad bar at your store. Stock your pantry so you always have certain foods on hand, such as olive oil, canned tuna, canned tomatoes, rice, pasta, and beans. Cooking  Cook foods with extra-virgin olive oil instead of using butter or other vegetable oils. Have meat as a side dish, and have vegetables or grains as your main dish. This means having meat in small portions or adding small amounts of meat to foods like pasta or stew. Use beans or vegetables instead of meat in common dishes like chili or lasagna. Experiment with different cooking methods. Try roasting or broiling vegetables instead of steaming or sauteing them. Add frozen vegetables to soups, stews, pasta, or rice. Add nuts or seeds for added healthy fat at each meal. You can add these to yogurt, salads, or vegetable dishes. Marinate fish or vegetables using olive oil, lemon juice, garlic, and fresh herbs. Meal planning  Plan to eat 1 vegetarian meal one day each week. Try to work up to 2 vegetarian meals, if possible. Eat seafood 2 or more times a week. Have healthy snacks readily available, such as: Vegetable sticks with hummus. Greek yogurt. Fruit and nut trail mix. Eat balanced meals throughout the week. This includes: Fruit: 2-3 servings a day Vegetables: 4-5 servings a day Low-fat dairy: 2 servings a day Fish, poultry, or lean meat: 1 serving a day Beans and  legumes: 2 or more servings a week Nuts and seeds: 1-2 servings a day Whole grains: 6-8 servings a day Extra-virgin olive oil: 3-4 servings a day Limit red meat and sweets to only a few servings a month What are my food choices? Mediterranean diet Recommended Grains: Whole-grain pasta. Brown rice. Bulgar wheat. Polenta. Couscous. Whole-wheat bread. [place]. Vegetables: Artichokes. Beets. Broccoli. Cabbage. Carrots. Eggplant. Green beans. Chard. Kale. Spinach. Onions. Leeks. Peas. Squash. Tomatoes. Peppers. Radishes. Fruits: Apples. Apricots. Avocado. Berries. Bananas. Cherries. Dates. Figs. Grapes. Lemons. Melon. Oranges. Peaches. Plums. Pomegranate. Meats and other protein foods: Beans. Almonds. Sunflower seeds. Pine nuts. Peanuts. [place]. Salmon. Scallops. Shrimp. [place]. Tilapia. Clams. Oysters. Eggs. Dairy: Low-fat milk. Cheese. Greek yogurt. Beverages: Water. Red wine. Herbal tea. Fats and oils: Extra virgin olive oil. Avocado oil. Grape seed oil. Sweets and desserts: [place] yogurt with honey. Baked apples. Poached pears. Trail mix. Seasoning and other foods: Basil. Cilantro. Coriander. Cumin. Mint. Parsley. Sage. Rosemary. Tarragon. Garlic. Oregano. Thyme. Pepper. Balsalmic vinegar. Tahini. Hummus. Tomato sauce. Olives. Mushrooms. Limit these Grains: Prepackaged pasta or rice dishes. Prepackaged cereal with added sugar. Vegetables: Deep fried potatoes (french fries). Fruits: Fruit canned in syrup. Meats and other protein foods: Beef. Pork. Lamb. Poultry with skin. Hot dogs. Bernie[place] Salines. Dairy: Ice cream. Sour  cream. Whole milk. Beverages: Juice. Sugar-sweetened soft drinks. Beer. Liquor and spirits. Fats and oils: Butter. Canola oil. Vegetable oil. Beef fat (tallow). Lard. Sweets and desserts: Cookies. Cakes. Pies. Candy. Seasoning and other foods: Mayonnaise. Premade sauces and marinades. The items listed may not be a complete list. Talk with your dietitian about what dietary choices  are right for you. Summary The Mediterranean diet includes both food and lifestyle choices. Eat a variety of fresh fruits and vegetables, beans, nuts, seeds, and whole grains. Limit the amount of red meat and sweets that you eat. Talk with your health care provider about whether it is safe for you to drink red wine in moderation. This means 1 glass a day for nonpregnant women and 2 glasses a day for men. A glass of wine equals 5 oz (150 mL). This information is not intended to replace advice given to you by your health care provider. Make sure you discuss any questions you have with your health care provider. Document Released: 11/20/2015 Document Revised: 12/23/2015 Document Reviewed: 11/20/2015 Elsevier Interactive Patient Education  2017 [employer].

## 2022-01-06 NOTE — Progress Notes (Signed)
Assessment/Plan:   Mild Cognitive Impairment likely due to Alzheimer's disease  Calvin Chandler is a very pleasant 76 y.o. RH male  with a history of hypertension, hyperlipidemia, DM2, OSA, bilateral hearing loss, vitamin D deficiency, anxiety, depression, Jehovah's Witness seen today in follow up to discuss the MRI results. These were personally reviewed, remarkable for  moderate generalized cerebral atrophy, mild cerebellar atrophy and minimal chronic small vessel ischemic changes within the cerebral white matter.   Patient is not on antidementia medication. Last MoCA on 11/30/21 was 24/30.   Follow up in  6 months. Neuropsych testing for clarity of diagnosis and disease trajectory is scheduled for 09/02/22 Recommend good control of cardiovascular risk factors.    Recommend B12 supplement daily  Resume CPAP for OSA Monitor the glucose levels (last A1C 10)  Start donepezil 5 mg daily. Side effects discussed    Subjective:    This patient is accompanied in the office by his wife who supplements the history.  Previous records as well as any outside records available were reviewed prior to todays visit. He was last seen on 11/30/21   Any changes in memory since last visit? He denies any changes since his last visit 1 month ago.    Initial visit 11/30/21  How long did patient have memory difficulties? "My wife and children said I have problems". Wife reports for about 3 months his short term memory is worse. He forgets appointments and engagements.  He forgets recent conversation and recently he has been angry easily. No issues with long term memory  Patient lives with: Spouse   repeats oneself? Endorsed "quite a bit" Disoriented when walking into a room?  Patient denies   Leaving objects in unusual places?  Patient denies   Ambulates  with difficulty?   Patient denies  knees and ankles bother him due to arthritis, but still walks, short distances though.  Recent falls?  Patient denies    Any head injuries?  Patient denies   History of seizures?   Patient denies   Wandering behavior?  Patient denies   Patient drives?  Denies any issues but wife says that he forgets where he is going sometimes. Sometime she acts like he is a Consulting civil engineer when driving.    Any mood changes? Irritability is more pronounced "He is unable to control it as before". No reason to be upset, but he has bouts of anger". This is present for a year. He is not on mood medications .  Any history of depression?: Endorsed, for the last 12 yrs. At one point counseling was recommended but did no like the medication that the therapist gave him and he stopped it  Hallucinations?  Patient denies   Paranoia?  Patient denies   Patient reports that he sleeps well without vivid dreams, REM behavior or sleepwalking. He wakes up to urinate and goes back to sleep  History of sleep apnea? Stopped using CPAP before Covid  Any hygiene concerns?  Patient denies . Stays in the bathroom or a long time up to 1 h "unclear what he does there" Independent of bathing and dressing?  Endorsed  Does the patient needs help with medications? Patient in charge  Who is in charge of the finances?  Patient is in charge   Any changes in appetite?  Patient denies   Patient have trouble swallowing? Patient denies   Does the patient cook?  Occasionally  Any kitchen accidents such as leaving the stove on? Patient denies  Any headaches?  Patient denies   Double vision? Patient denies   Any focal numbness or tingling?  Patient denies   Chronic back pain Patient denies   Unilateral weakness?  Patient denies   Any tremors?  Patient denies   Any history of anosmia?  Patient denies   Any incontinence of urine? Endorsed, wears depends  Any bowel dysfunction?   He denies.  History of heavy alcohol intake? Drinks 8 oz wine a night  History of heavy tobacco use?  Patient denies   Family history of dementia? Mother died 52 dementia  School bus driver    MRI brain 2/50/03  remarkable for moderate generalized cerebral atrophy, mild cerebellar atrophy and minimal chronic small vessel ischemic changes within the cerebral white matter  Pertinent labs 11/30/21 TSH 1.09 , B12 low at 210. Last A1C 11/27/21 was 10.4, [place] 1.3   CURRENT MEDICATIONS:  Outpatient Encounter Medications as of 01/06/2022  Medication Sig   Accu-Chek Softclix Lancets lancets 1 each by Other route as directed. Use as instructed   atorvastatin (LIPITOR) 10 MG tablet Take 1 tablet (10 mg total) by mouth daily.   Blood Glucose Monitoring Suppl (ACCU-CHEK GUIDE) w/Device KIT 1 Device by Does not apply route as directed.   glucose blood (ACCU-CHEK GUIDE) test strip Use 2-4 times daily as needed   Lancets Misc. (ACCU-CHEK SOFTCLIX LANCET DEV) KIT 1 Device by Does not apply route as directed.   Multiple Vitamins-Minerals (MULTIVITAMIN MEN 50+ PO) Take by mouth.   polyethylene glycol (MIRALAX) 17 g packet Take 17 g by mouth daily. prn constipation   Semaglutide,0.25 or 0.5MG/DOS, 2 MG/3ML SOPN O.25 mg sq weekly x 1 month then increase to 0.5 ng weekly   Semaglutide,0.25 or 0.5MG/DOS, 2 MG/3ML SOPN Inject 0.25 mg into the skin once a week. Increase to 0.93m in one month.   Vitamin D, Ergocalciferol, (DRISDOL) 1.25 MG (50000 UNIT) CAPS capsule 1 po q wed and 1 po q sun (Patient not taking: Reported on 10/30/2021)   No facility-administered encounter medications on file as of 01/06/2022.        No data to display            11/30/2021   12:00 PM  Montreal Cognitive Assessment   Visuospatial/ Executive (0/5) 4  Naming (0/3) 3  Attention: Read list of digits (0/2) 2  Attention: Read list of letters (0/1) 1  Attention: Serial 7 subtraction starting at 100 (0/3) 3  Language: Repeat phrase (0/2) 0  Language : Fluency (0/1) 1  Abstraction (0/2) 0  Delayed Recall (0/5) 4  Orientation (0/6) 6  Total 24  Adjusted Score (based on education) 24   Thank you for allowing [facility]  opportunity to participate in the care of this nice patient. Please do not hesitate to contact [place]for any questions or concerns.   Total time spent on today's visit was 36 minutes dedicated to this patient today, preparing to see patient, examining the patient, ordering tests and/or medications and counseling the patient, documenting clinical information in the EHR or other health record, independently interpreting results and communicating results to the patient/family, discussing treatment and goals, answering patient's questions and coordinating care.  Cc:  LAnn Held DO  SClarise CruzWWestern Pa Surgery Center [facility]9/27/2023 6:45 AM

## 2022-02-16 ENCOUNTER — Telehealth: Payer: Self-pay | Admitting: Physician Assistant

## 2022-02-16 NOTE — Telephone Encounter (Signed)
Called to sch a EEG for patient  02-08-22 [facility]  02-11-22 [facility]   02-16-22 [facility]

## 2022-02-18 ENCOUNTER — Ambulatory Visit: Payer: MEDICARE | Admitting: Family Medicine

## 2022-02-19 ENCOUNTER — Other Ambulatory Visit (HOSPITAL_BASED_OUTPATIENT_CLINIC_OR_DEPARTMENT_OTHER): Payer: Self-pay

## 2022-02-19 ENCOUNTER — Encounter: Payer: Self-pay | Admitting: Family Medicine

## 2022-02-19 ENCOUNTER — Ambulatory Visit (INDEPENDENT_AMBULATORY_CARE_PROVIDER_SITE_OTHER): Payer: Medicare Other | Admitting: Family Medicine

## 2022-02-19 VITALS — BP 132/74 | HR 95 | Temp 97.5°F | Resp 16 | Wt 239.0 lb

## 2022-02-19 DIAGNOSIS — E785 Hyperlipidemia, unspecified: Secondary | ICD-10-CM

## 2022-02-19 DIAGNOSIS — E1151 Type 2 diabetes mellitus with diabetic peripheral angiopathy without gangrene: Secondary | ICD-10-CM

## 2022-02-19 DIAGNOSIS — Z6836 Body mass index (BMI) 36.0-36.9, adult: Secondary | ICD-10-CM

## 2022-02-19 DIAGNOSIS — I10 Essential (primary) hypertension: Secondary | ICD-10-CM

## 2022-02-19 DIAGNOSIS — E1165 Type 2 diabetes mellitus with hyperglycemia: Secondary | ICD-10-CM

## 2022-02-19 DIAGNOSIS — E66812 Obesity, class 2: Secondary | ICD-10-CM | POA: Insufficient documentation

## 2022-02-19 DIAGNOSIS — E1169 Type 2 diabetes mellitus with other specified complication: Secondary | ICD-10-CM

## 2022-02-19 DIAGNOSIS — G4733 Obstructive sleep apnea (adult) (pediatric): Secondary | ICD-10-CM | POA: Diagnosis not present

## 2022-02-19 DIAGNOSIS — E559 Vitamin D deficiency, unspecified: Secondary | ICD-10-CM | POA: Diagnosis not present

## 2022-02-19 HISTORY — DX: Morbid (severe) obesity due to excess calories: E66.01

## 2022-02-19 HISTORY — DX: Body mass index (BMI) 36.0-36.9, adult: Z68.36

## 2022-02-19 MED ORDER — AREXVY 120 MCG/0.5ML IM SUSR
INTRAMUSCULAR | 0 refills | Status: DC
  Filled 2022-02-19: qty 0.5, 1d supply, fill #0

## 2022-02-19 NOTE — Progress Notes (Signed)
Subjective:   By signing my name below, I, Roma Schanz, attest that this documentation has been prepared under the direction and in the presence of Roma Schanz, 02/19/2022.   Patient ID: Calvin Chandler, male    DOB: [birth year], 76 y.o.   MRN: [id number]  Chief Complaint  Patient presents with  . Diabetes    Here for follow up  . Hyperlipidemia    Here for follow up     HPI Patient is in today for an office visit.  Diabetes Patient is taking 0.25 mg Semaglutide to lower blood sugar levels and lose weight.   Hyperlipidemia Patient is complaint with his 10 mg Lipitor medication for his cholesterol.   Sleep Apnea Patient is inquiring about the inspire implant for sleep apnea. He reports that he does not wear a CPAP. Patient previously saw Dr. Halford Chessman for a sleep study.  Immunizations He is not interested in receiving an influenza vaccine this visit. Health Maintenance Due  Topic Date Due  . Medicare Annual Wellness (AWV)  04/24/2014  . COVID-19 Vaccine (4 - Pfizer series) 03/28/2020  . Zoster Vaccines- Shingrix (2 of 2) 01/12/2022    Past Medical History:  Diagnosis Date  . Allergy   . Anxiety   . Arthritis    knees, hands, back , hips  . Cataract    forming  . Depression   . Diabetes mellitus without complication ([place])   . Hyperlipidemia   . OSA (obstructive sleep apnea) 01/11/2018   no cpap  . Sleep apnea     Past Surgical History:  Procedure Laterality Date  . COLONOSCOPY  10/26/2017  . COLONOSCOPY WITH PROPOFOL  10/30/2021  . EYE SURGERY    . POLYPECTOMY    . WISDOM TOOTH EXTRACTION      Family History  Problem Relation Age of Onset  . Heart disease Mother   . Heart failure Mother   . Prostate cancer Father   . Pulmonary embolism Father   . Stomach cancer Sister   . Breast cancer Sister   . Transient ischemic attack Sister   . Cancer Sister        lymph node cancer  . Transient ischemic attack Sister   . Clotting disorder Sister    . Hypertension Other   . Stroke Other   . Clotting disorder Other        Thromboembolism clotting disorder  . Colon cancer Neg Hx   . Rectal cancer Neg Hx   . Pancreatic cancer Neg Hx   . Esophageal cancer Neg Hx   . Liver cancer Neg Hx   . Colon polyps Neg Hx     Social History   Socioeconomic History  . Marital status: Married    Spouse name: Not on file  . Number of children: 3  . Years of education: 51  . Highest education level: Not on file  Occupational History  . Occupation: Recruitment consultant  Tobacco Use  . Smoking status: Former    Packs/day: 3.00    Years: 13.00    Total pack years: 39.00    Types: Cigarettes    Quit date: 03/03/1981    Years since quitting: 40.9  . Smokeless tobacco: Never  Vaping Use  . Vaping Use: Never used  Substance and Sexual Activity  . Alcohol use: Yes    Comment: 6 oz  . Drug use: No  . Sexual activity: Yes    Partners: Female  Other Topics Concern  .  Not on file  Social History Narrative   Right handed   Drinks caffeine   One story home   Social Determinants of Health   Financial Resource Strain: Not on file  Food Insecurity: Not on file  Transportation Needs: Not on file  Physical Activity: Not on file  Stress: Not on file  Social Connections: Not on file  Intimate Partner Violence: Not on file    Outpatient Medications Prior to Visit  Medication Sig Dispense Refill  . Accu-Chek Softclix Lancets lancets 1 each by Other route as directed. Use as instructed 100 each 12  . atorvastatin (LIPITOR) 10 MG tablet Take 1 tablet (10 mg total) by mouth daily. 90 tablet 3  . Blood Glucose Monitoring Suppl (ACCU-CHEK GUIDE) w/Device KIT 1 Device by Does not apply route as directed. 1 kit 0  . donepezil (ARICEPT) 5 MG tablet Take 1 tablet (5 mg total) by mouth at bedtime. 30 tablet 11  . glucose blood (ACCU-CHEK GUIDE) test strip Use 2-4 times daily as needed 375 each 3  . Lancets Misc. (ACCU-CHEK SOFTCLIX LANCET DEV) KIT 1 Device by  Does not apply route as directed. 1 kit 3  . Multiple Vitamins-Minerals (MULTIVITAMIN MEN 50+ PO) Take by mouth.    . polyethylene glycol (MIRALAX) 17 g packet Take 17 g by mouth daily. prn constipation 14 each 0  . Semaglutide,0.25 or 0.5MG/DOS, 2 MG/3ML SOPN O.25 mg sq weekly x 1 month then increase to 0.5 ng weekly 3 mL 1  . Semaglutide,0.25 or 0.5MG/DOS, 2 MG/3ML SOPN Inject 0.25 mg into the skin once a week. Increase to 0.43m in one month. 3 mL 1  . Vitamin D, Ergocalciferol, (DRISDOL) 1.25 MG (50000 UNIT) CAPS capsule 1 po q wed and 1 po q sun (Patient not taking: Reported on 10/30/2021) 8 capsule 0   No facility-administered medications prior to visit.    Allergies  Allergen Reactions  . Metformin And Related Diarrhea    Review of Systems  Constitutional:  Negative for chills, fever and malaise/fatigue.  HENT:  Negative for congestion and hearing loss.   Eyes:  Negative for discharge.  Respiratory:  Negative for cough, sputum production and shortness of breath.   Cardiovascular:  Negative for chest pain, palpitations and leg swelling.  Gastrointestinal:  Negative for abdominal pain, blood in stool, constipation, diarrhea, heartburn, nausea and vomiting.  Genitourinary:  Negative for dysuria, frequency, hematuria and urgency.  Musculoskeletal:  Negative for back pain, falls and myalgias.  Skin:  Negative for rash.  Neurological:  Negative for dizziness, sensory change, loss of consciousness, weakness and headaches.  Endo/Heme/Allergies:  Negative for environmental allergies. Does not bruise/bleed easily.  Psychiatric/Behavioral:  Negative for depression and suicidal ideas. The patient is not nervous/anxious and does not have insomnia.       Objective:    Physical Exam Vitals and nursing note reviewed.  Constitutional:      General: He is not in acute distress.    Appearance: Normal appearance. He is well-developed. He is not ill-appearing.  HENT:     Head: Normocephalic and  atraumatic.     Right Ear: External ear normal.     Left Ear: External ear normal.  Eyes:     Extraocular Movements: Extraocular movements intact.     Pupils: Pupils are equal, round, and reactive to light.  Neck:     Thyroid: No thyromegaly.  Cardiovascular:     Rate and Rhythm: Normal rate and regular rhythm.  Heart sounds: Normal heart sounds. No murmur heard.    No gallop.  Pulmonary:     Effort: Pulmonary effort is normal. No respiratory distress.     Breath sounds: Normal breath sounds. No wheezing or rales.  Chest:     Chest wall: No tenderness.  Musculoskeletal:     Cervical back: Normal range of motion and neck supple.     Right hip: Tenderness present. Normal range of motion. Normal strength.     Left hip: Tenderness present. Normal range of motion. Normal strength.     Right foot: Bony tenderness present. No swelling.     Left foot: Bony tenderness present. No swelling.  Skin:    General: Skin is warm and dry.  Neurological:     Mental Status: He is alert and oriented to person, place, and time.  Psychiatric:        Behavior: Behavior normal.        Thought Content: Thought content normal.        Judgment: Judgment normal.    BP 132/74 (BP Location: Left Arm, Patient Position: Sitting, Cuff Size: Large)   Pulse 95   Temp (!) 97.5 F (36.4 C) (Oral)   Resp 16   Wt 239 lb (108.4 kg)   SpO2 98%   BMI 35.29 kg/m  Wt Readings from Last 3 Encounters:  02/19/22 239 lb (108.4 kg)  01/06/22 239 lb (108.4 kg)  11/30/21 239 lb (108.4 kg)       Assessment & Plan:   Problem List Items Addressed This Visit       Endocrine   Hyperlipidemia associated with type 2 diabetes mellitus ([place]) - Primary   Relevant Orders   Comprehensive metabolic panel   Lipid panel     Other   Vitamin D deficiency   Relevant Orders   VITAMIN D 25 Hydroxy (Vit-D Deficiency, Fractures)   Other Visit Diagnoses     Uncontrolled type 2 diabetes mellitus with hyperglycemia ([place])        Relevant Orders   Comprehensive metabolic panel   Hemoglobin A1c   Microalbumin / creatinine urine ratio      No orders of the defined types were placed in this encounter.   I, Roma Schanz, personally preformed the services described in this documentation.  All medical record entries made by the scribe were at my direction and in my presence.  I have reviewed the chart and discharge instructions (if applicable) and agree that the record reflects my personal performance and is accurate and complete. 02/19/2022.   I,Verona Buck,acting as a Education administrator for [employer], DO.,have documented all relevant documentation on the behalf of Ann Held, DO,as directed by  Ann Held, DO while in the presence of Ann Held, DO.    Luna Glasgow

## 2022-02-19 NOTE — Patient Instructions (Signed)

## 2022-02-22 ENCOUNTER — Telehealth: Payer: Self-pay

## 2022-02-22 NOTE — Telephone Encounter (Signed)
   CCM RN Visit Note   February 22, 2022 Name: Calvin Chandler MRN: [id number]      DOB: 1945-06-21  Subjective: Calvin Chandler is a 76 y.o. year old male who is a primary care patient of Ann Held, DO. The patient was referred to the Chronic Care Management team for assistance with care management needs subsequent to provider initiation of CCM services and plan of care.      An unsuccessful telephone outreach was attempted today to contact the patient about Chronic Care Management needs.     PLAN A HIPAA compliant phone message was left for the patient providing contact information and requesting a return call.   Horris Latino RN Care Manager/Chronic Care Management [phone]

## 2022-02-24 ENCOUNTER — Other Ambulatory Visit (HOSPITAL_BASED_OUTPATIENT_CLINIC_OR_DEPARTMENT_OTHER): Payer: Self-pay

## 2022-03-03 ENCOUNTER — Telehealth: Payer: Self-pay

## 2022-03-03 NOTE — Progress Notes (Unsigned)
  Chronic Care Management   Note  03/03/2022 Name: Calvin Chandler MRN: [id number] DOB: [birth year]  Calvin Chandler is a 76 y.o. year old male who is a primary care patient of Ann Held, DO. I reached out to Calvin Chandler by phone today in response to a referral sent by Calvin Chandler PCP.  Calvin Chandler was not successfully contacted today. A HIPAA compliant voice message was left requesting a return call.   Follow up plan: Additional outreach attempts will be made.  Calvin Chandler, [place], [place] [address] Direct Dial: [phone] [email or web address]

## 2022-03-27 ENCOUNTER — Encounter: Payer: Self-pay | Admitting: Family Medicine

## 2022-03-29 MED ORDER — SEMAGLUTIDE(0.25 OR 0.5MG/DOS) 2 MG/3ML ~~LOC~~ SOPN: 0.5000 mg | PEN_INJECTOR | SUBCUTANEOUS | 3 refills | Status: DC

## 2022-03-30 NOTE — Progress Notes (Unsigned)
  Chronic Care Management   Note  03/30/2022 Name: Calvin Chandler MRN: [id number] DOB: [birth year]  Calvin Chandler is a 76 y.o. year old male who is a primary care patient of Ann Held, DO. I reached out to Vinnie Level by phone today in response to a referral sent by [name] PCP.  The second contact attempt was unsuccessful.   Follow up plan: Additional outreach attempts will be made.  Noreene Larsson, [place], [place] [address] Direct Dial: [phone] [email or web address]

## 2022-03-31 NOTE — Progress Notes (Signed)
  Chronic Care Management   Note  03/31/2022 Name: Calvin Chandler MRN: [id number] DOB: [birth year]  Calvin Chandler is a 76 y.o. year old male who is a primary care patient of Calvin Held, Calvin Chandler. I reached out to Calvin Chandler by phone today in response to a referral sent by Calvin Chandler's PCP.  The third contact attempt was unsuccessful.   Follow up plan: Unable to reach patient after 3 attempts. No further outreach attempts will be made pending additional provider engagement, patient request, or new provider order.   Noreene Larsson, [place], [place] [address] Direct Dial: [phone] [email or web address]

## 2022-04-02 ENCOUNTER — Other Ambulatory Visit (HOSPITAL_BASED_OUTPATIENT_CLINIC_OR_DEPARTMENT_OTHER): Payer: Self-pay

## 2022-04-02 MED ORDER — COMIRNATY 30 MCG/0.3ML IM SUSY
PREFILLED_SYRINGE | INTRAMUSCULAR | 0 refills | Status: DC
  Filled 2022-04-02: qty 0.3, 1d supply, fill #0

## 2022-04-06 DIAGNOSIS — E119 Type 2 diabetes mellitus without complications: Secondary | ICD-10-CM | POA: Diagnosis not present

## 2022-04-06 LAB — HM DIABETES EYE EXAM

## 2022-04-22 ENCOUNTER — Other Ambulatory Visit: Payer: Medicare HMO

## 2022-06-09 ENCOUNTER — Encounter: Payer: Self-pay | Admitting: Physician Assistant

## 2022-07-12 ENCOUNTER — Ambulatory Visit: Payer: MEDICARE | Admitting: Physician Assistant

## 2022-07-28 ENCOUNTER — Ambulatory Visit: Payer: Medicare HMO | Admitting: Physician Assistant

## 2022-07-28 ENCOUNTER — Encounter: Payer: Self-pay | Admitting: Physician Assistant

## 2022-07-28 DIAGNOSIS — Z029 Encounter for administrative examinations, unspecified: Secondary | ICD-10-CM

## 2022-09-02 ENCOUNTER — Ambulatory Visit: Payer: Medicare HMO | Admitting: Psychology

## 2022-09-02 ENCOUNTER — Encounter: Payer: Self-pay | Admitting: Psychology

## 2022-09-02 DIAGNOSIS — F411 Generalized anxiety disorder: Secondary | ICD-10-CM

## 2022-09-02 DIAGNOSIS — F4312 Post-traumatic stress disorder, chronic: Secondary | ICD-10-CM | POA: Diagnosis not present

## 2022-09-02 DIAGNOSIS — R4189 Other symptoms and signs involving cognitive functions and awareness: Secondary | ICD-10-CM

## 2022-09-02 NOTE — Progress Notes (Signed)
   Psychometrician Note   Cognitive testing was administered to Calvin Chandler by Wallace Keller, B.S. (psychometrist) under the supervision of Dr. Newman Nickels, Ph.D., licensed psychologist on 09/02/2022. Calvin Chandler did not appear overtly distressed by the testing session per behavioral observation or responses across self-report questionnaires. Rest breaks were offered.    The battery of tests administered was selected by Dr. Newman Nickels, Ph.D. with consideration to Calvin Chandler current level of functioning, the nature of his symptoms, emotional and behavioral responses during interview, level of literacy, observed level of motivation/effort, and the nature of the referral question. This battery was communicated to the psychometrist. Communication between Dr. Newman Nickels, Ph.D. and the psychometrist was ongoing throughout the evaluation and Dr. Newman Nickels, Ph.D. was immediately accessible at all times. Dr. Newman Nickels, Ph.D. provided supervision to the psychometrist on the date of this service to the extent necessary to assure the quality of all services provided.    Calvin Chandler will return within approximately 1-2 weeks for an interactive feedback session with Dr. [name] at which time his test performances, clinical impressions, and treatment recommendations will be reviewed in detail. Calvin Chandler understands he can contact our office should he require our assistance before this time.  A total of 135 minutes of billable time were spent face-to-face with Calvin Chandler by the psychometrist. This includes both test administration and scoring time. Billing for these services is reflected in the clinical report generated by Dr. Newman Nickels, Ph.D.  This note reflects time spent with the psychometrician and does not include test scores or any clinical interpretations made by Dr. [name]. The full report will follow in a separate note.

## 2022-09-02 NOTE — Progress Notes (Addendum)
NEUROPSYCHOLOGICAL EVALUATION [place]. [facility] Department of Neurology  Date of Evaluation: Sep 02, 2022  Reason for Referral:   Calvin Chandler is a 77 y.o. right-handed African-American male referred by Marlowe Kays, PA-C, to characterize his current cognitive functioning and assist with diagnostic clarity and treatment planning in the context of subjective cognitive decline.   Assessment and Plan:   Clinical Impression(s): Calvin Chandler pattern of performance is suggestive of neuropsychological functioning within normal limits relative to age-matched peers. Outside of slight variability across semantic fluency tasks, performances were appropriate across all assessed cognitive domains. This includes processing speed, attention/concentration, executive functioning, safety/judgment, receptive language, phonemic fluency, confrontation naming, visuospatial abilities, and all aspects of learning and memory. Calvin Chandler denied difficulties completing instrumental activities of daily living (ADLs) independently. While his wife reported some varying medication adherence, she also acknowledged that this may be related to Calvin Chandler choice to not take these medications as opposed to simple forgetfulness. He does not warrant consideration for a neurocognitive disorder at the present time.   Day-to-day cognitive lapses are likely influenced by chronic medical ailments (including potentially poorly managed type II diabetes), untreated obstructive sleep apnea, and psychiatric distress (favoring PTSD and anxiety-based symptoms). Calvin Chandler wife described some personality changes surrounding emotional outbursts and loud, anger filled moments where she can feel less safe in her surroundings. At this point in time, based upon intact cognitive functioning, this may be most directly related to untreated PTSD and other psychosocial stressors. Calvin Chandler himself stated that PTSD symptoms are  present daily, poorly managed, and may be worsening. Emotional outbursts can be common with this condition. Neurologically speaking, the behavioral variant of frontotemporal dementia (bvFTD) can cause prominent personality changes. However, Calvin Chandler age is somewhat advanced for when this illness normally presents, testing did not suggest concerning patterns of performance, and recent neuroimaging revealed generalized patterns of atrophy. Overall, the likelihood of this neurological illness remains low given currently available information.  Specific to memory, Calvin Chandler was able to learn novel verbal and visual information efficiently and retain this knowledge after lengthy delays. Overall, memory performance combined with intact performances across other areas of cognitive functioning is not suggestive of Alzheimer's disease. Likewise, his cognitive and behavioral profile is not suggestive of any other form of neurodegenerative illness presently.  Recommendations: Should he or his family express concern surrounding progressive cognitive decline in the future, a repeat evaluation could be considered at that time. The current evaluation will serve as an excellent baseline.   I would strongly recommend that he discuss medication based treatment options for PTSD with his PCP. She may wish to refer him to a psychiatrist for symptom management. I would also strongly encourage him to establish care with an individual therapist to directly treat ongoing symptoms related to this condition.   Untreated sleep apnea will increase his risk for stroke, heart attack, and a future dementia presentation. Similarly, poorly managed type II diabetes will increase the risk for progressive, permanent cognitive impairment in the future. I would advise him to fully adhere to treatment recommendations surrounding these conditions, as well as other medical ailments in general.   If members of his medical team have greater  concerns for a bvFTD presentation, an FDG-PET scan could be considered.   Calvin Chandler is encouraged to attend to lifestyle factors for brain health (e.g., regular physical exercise, good nutrition habits and consideration of the MIND-DASH diet, regular participation in cognitively-stimulating activities, and general stress management  techniques), which are likely to have benefits for both emotional adjustment and cognition. In fact, in addition to promoting good general health, regular exercise incorporating aerobic activities (e.g., brisk walking, jogging, cycling, etc.) has been demonstrated to be a very effective treatment for depression and stress, with similar efficacy rates to both antidepressant medication and psychotherapy. Optimal control of vascular risk factors (including safe cardiovascular exercise and adherence to dietary recommendations) is encouraged. Continued participation in activities which provide mental stimulation and social interaction is also recommended.   Memory can be improved using internal strategies such as rehearsal, repetition, chunking, mnemonics, association, and imagery. External strategies such as written notes in a consistently used memory journal, visual and nonverbal auditory cues such as a calendar on the refrigerator or appointments with alarm, such as on a cell phone, can also help maximize recall.    To address problems with fluctuating attention and/or executive dysfunction, he may wish to consider:   -Avoiding external distractions when needing to concentrate   -Limiting exposure to fast paced environments with multiple sensory demands   -Writing down complicated information and using checklists   -Attempting and completing one task at a time (i.e., no multi-tasking)   -Verbalizing aloud each step of a task to maintain focus   -Taking frequent breaks during the completion of steps/tasks to avoid fatigue   -Reducing the amount of information considered at one  time   -Scheduling more difficult activities for a time of day where he is usually most alert  Review of Records:   Calvin Chandler was seen by [facility] Marlowe Kays, PA-C) on 11/30/2021 for an evaluation of memory loss. At that time, Calvin Chandler largely denied concerns. However, he acknowledged that his wife and children have described their perception of worsening cognitive abilities. The primary example provided surrounded trouble recalling details of recent conversations. Some repetition in conversation was also described. They also described increased irritability and emotional, angry outbursts. ADLs were largely described as intact. Performance on a brief cognitive screening instrument (MOCA) was 24/30. Ultimately, Calvin Chandler was referred for a comprehensive neuropsychological evaluation to characterize his cognitive abilities and to assist with diagnostic clarity and treatment planning.   Brain MRI on 12/26/2007 performed due to reported memory loss was unremarkable. Brain MRI on 12/09/2021 revealed moderate generalized cerebral atrophy and mild microvascular ischemic disease.   Past Medical History:  Diagnosis Date   Acute bronchitis 08/03/2007   Acute pain of right knee 03/07/2017   Allergic rhinitis    Arthritis    knees, hands, back , hips   Bilateral hearing loss 11/21/2021   Cataract    forming   Chest pain, atypical 01/23/2008   Chronic post-traumatic stress disorder    Chronic right shoulder pain 11/29/2019   Class 2 severe obesity with serious comorbidity and body mass index (BMI) of 36.0 to 36.9 in adult, unspecified obesity type 02/19/2022   Constipation 02/27/2020   Dizziness 01/18/2008   Dysthymia 10/16/2007   Edema 12/05/2007   Generalized anxiety disorder 10/16/2007   Hyperlipidemia associated with type 2 diabetes mellitus 12/27/2019   Left flank pain 04/24/2013   OSA (obstructive sleep apnea) 01/11/2018   no cpap   Right calf pain 03/07/2017   Type 2 diabetes  mellitus without complication, without long-term current use of insulin 12/27/2019   Vitamin D deficiency 01/28/2020    Past Surgical History:  Procedure Laterality Date   COLONOSCOPY  10/26/2017   COLONOSCOPY WITH PROPOFOL  10/30/2021   EYE SURGERY  POLYPECTOMY     WISDOM TOOTH EXTRACTION      Current Outpatient Medications:    atorvastatin (LIPITOR) 10 MG tablet, Take 1 tablet (10 mg total) by mouth daily., Disp: 90 tablet, Rfl: 3   COVID-19 mRNA vaccine 2023-2024 (COMIRNATY) syringe, Inject into the muscle., Disp: 0.3 mL, Rfl: 0   donepezil (ARICEPT) 5 MG tablet, Take 1 tablet (5 mg total) by mouth at bedtime., Disp: 30 tablet, Rfl: 11   Multiple Vitamins-Minerals (MULTIVITAMIN MEN 50+ PO), Take by mouth., Disp: , Rfl:    polyethylene glycol (MIRALAX) 17 g packet, Take 17 g by mouth daily. prn constipation, Disp: 14 each, Rfl: 0   RSV vaccine recomb adjuvanted (AREXVY) 120 MCG/0.5ML injection, Inject into the muscle., Disp: 0.5 mL, Rfl: 0   Semaglutide,0.25 or 0.5MG /DOS, 2 MG/3ML SOPN, Inject 0.5 mg into the skin once a week., Disp: 3 mL, Rfl: 3  Clinical Interview:   The following information was obtained during a clinical interview with Calvin Chandler, his wife, and their daughter prior to cognitive testing.  Cognitive Symptoms: Decreased short-term memory: Largely denied. He described memory lapses as "they come and go," primarily noting instances where he may enter a room and forget his intention. He was unable to provide a timeline for how long these instances have been observed. His wife and daughter described more pronounced concerns, largely surrounding Calvin Chandler seeming to forget recently told information quickly and being more repetitive in day-to-day conversation. His wife reported her perception that memory abilities have been progressively declining at least over the past year. As stated above, a brain MRI was performed in 2009 due to reported memory loss, suggesting a far  longer period of concern.  Decreased long-term memory: Denied. Decreased attention/concentration: Denied. His wife stated that Mr. Bas seems far more easily distractible and is unable to sustain his focus for lengthy periods of time. She noted that his mind will seemingly wander frequently.  Reduced processing speed: Denied. Difficulties with executive functions: Denied. Difficulties with emotion regulation: Denied. His wife reported quite significant concerns surrounding emotion regulation, stating that Ms. Handley will become "loud" and have angry outbursts to the point where she alluded to fearing for her safety. His daughter also added that Mr. Harrow seems more easily irritated and has a shorter fuse lately.  Difficulties with receptive language: Denied. Difficulties with word finding: Denied. Decreased visuoperceptual ability: Denied.  Difficulties completing ADLs: Denied. His wife remarked that he has poor medication adherence, especially when it comes to diabetes-related medication. It was unclear if this adherence is directly related to memory loss or is a personal choice by Mr. November to not take this medication. His wife also alluded to some trouble with bill paying but no examples were provided. He continues to drive. His daughter alluded to potential navigational decline and that he does not like to use a GPS. Other than this, no safety concerns were noted.   Additional Medical History: History of traumatic brain injury/concussion: Denied. History of stroke: Denied. History of seizure activity: Denied. History of known exposure to toxins: Denied. Symptoms of chronic pain: Endorsed. He reported diffuse arthritic pain stemming from his neck down to his feet.  Experience of frequent headaches/migraines: Denied. Frequent instances of dizziness/vertigo: Denied.  Sensory changes: He wears glasses and utilizes hearing aids with benefit. Hearing loss and residual tinnitus was attributed  to time spent in the [employer] around [employer]. Other sensory changes/difficulties (e.g., taste or smell) were denied.  Balance/coordination difficulties: Denied. He reported  occasionally mis-stepping but denied any frank balance concerns or recent falls.  Other motor difficulties: He described sporadic instances where his hands might briefly tremor, generally while performing some fine motor action like texting on his phone. His daughter also observed occasional instances where tremors are observed when he is reaching out his hands to grab something.   Sleep History: Estimated hours obtained each night: 4-6 hours.  Difficulties falling asleep: Denied. He described staying up late each night but this seemed to be of his own volition rather than an inability to fall asleep.  Difficulties staying asleep: Denied. Feels rested and refreshed upon awakening: Denied.  History of snoring: Endorsed. History of waking up gasping for air: Endorsed. Witnessed breath cessation while asleep: Endorsed. He acknowledged a history of obstructive sleep apnea. He does not use his CPAP machine due to mask discomfort. As such, this condition appears untreated.   History of vivid dreaming: Denied. Excessive movement while asleep: Denied. Instances of acting out his dreams: Denied.  Psychiatric/Behavioral Health History: Depression: He described a long history of depressive symptoms and medical records suggest a prior diagnosis of dysthymia. Depression was attributed to [employer] and coincides with PTSD symptoms. His wife noted that he briefly worked with a therapist many years ago who gave him a medication which made him feel unlike himself. He discontinued this medication and stopped working with the therapist. He described his current mood as it "comes and goes." Current or remote suicidal ideation, intent, or plan was denied.  Anxiety: Endorsed. Mania: Denied. Trauma History: Endorsed. 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 He noted active symptoms, largely surrounding prominent mood swings and emotional/behavioral dysregulation. He appeared to express some resistance to medication and therapeutic interventions for this condition during the current interview.  Visual/auditory hallucinations: Denied. Delusional thoughts: Denied.  Tobacco: Denied. Alcohol: He reported consuming either a shot of liquor or a beer most nights of the week. He denied a history of problematic alcohol abuse or dependence.  Recreational drugs: Denied.  Family History: Problem Relation Age of Onset   Heart disease Mother    Heart failure Mother    Dementia Mother    Prostate cancer Father    Pulmonary embolism Father    Stomach cancer Sister    Breast cancer Sister    Transient ischemic attack Sister    Cancer Sister        lymph node cancer   Transient ischemic attack Sister    Clotting disorder Sister    Hypertension Other    Stroke Other    Clotting disorder Other        Thromboembolism clotting disorder   Colon cancer Neg Hx    Rectal cancer Neg Hx    Pancreatic cancer Neg Hx    Esophageal cancer Neg Hx    Liver cancer Neg Hx    Colon polyps Neg Hx    This information was confirmed by Calvin Chandler.  Academic/Vocational History: Highest level of educational attainment: 14 years. He graduated from high school and estimated completing 2.5 years of college. 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 However, he did not identify any subjects representing relative weakness across academic settings.  History of developmental delay: Denied. History of grade repetition: Denied. Enrollment in special education courses: Denied. History of LD/ADHD: Denied.  Employment: Retired. As stated above, he spent two years in  the [place] [employer]. 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   Evaluation Results:   Behavioral Observations: Calvin Chandler was accompanied by his wife and daughter, arrived to his appointment on time, and was appropriately dressed and groomed. He appeared alert. Observed gait and station were within normal limits. Gross motor functioning appeared intact upon informal observation and no abnormal movements (e.g., tremors) were noted. His affect was somewhat flat and guarded throughout the interview process. Spontaneous speech was fluent and word finding difficulties were not observed during the clinical interview. Thought processes were coherent, organized, and normal in content. Insight into his cognitive difficulties appeared adequate.   During testing, sustained attention was appropriate. His affect was noted to be more bright and positive. Task engagement was adequate and he persisted when challenged. Overall, Calvin Chandler was cooperative with the clinical interview and subsequent testing procedures.   Adequacy of Effort: The validity of neuropsychological testing is limited by the extent to which the individual being tested may be assumed to have exerted adequate effort during testing. Mr. Benda expressed his intention to perform to the best of his abilities and exhibited adequate task engagement and persistence. Scores across stand-alone and embedded performance validity measures were within expectation. As such, the results of the current evaluation are believed to be a valid representation of Mr. Reetz current cognitive functioning.  Test Results: Mr. Glazewski was oriented at the time of the current evaluation.  Intellectual abilities based upon educational and vocational attainment were estimated to be in the below average to average range. Premorbid abilities were estimated to be within the average range based upon a single-word reading test.   Processing speed was average to  well above average. Basic attention was average. More complex attention (e.g., working memory) was average to above average. Executive functioning was average to above average. He performed in the average range across a task assessing safety and judgment.  Assessed receptive language abilities were above average. Likewise, Mr. Czarnecki did not exhibit any difficulties comprehending task instructions and answered all questions asked of him appropriately. Assessed expressive language (e.g., verbal fluency and confrontation naming) was largely average to well above average. Some greater variability was exhibited across semantic fluency (well below average to average).   Assessed visuospatial/visuoconstructional abilities were mildly variable but overall appropriate, ranging from the below average to well above average normative ranges.    Learning (i.e., encoding) of novel verbal information was average. Spontaneous delayed recall (i.e., retrieval) of previously learned information was average to above average. Retention rates were strong across all memory testing. Performance across recognition tasks was appropriate, suggesting evidence for information consolidation.   Results of emotional screening instruments suggested that recent symptoms of generalized anxiety were in the mild range, while symptoms of depression were within normal limits. A screening instrument assessing recent sleep quality suggested the presence of minimal sleep dysfunction.  Tables of Scores:   Note: This summary of test scores accompanies the interpretive report and should not be considered in isolation without reference to the appropriate sections in the text. Descriptors are based on appropriate normative data and may be adjusted based on clinical judgment. Terms such as "Within Normal Limits" and "Outside Normal Limits" are used when a more specific description of the test score cannot be determined.       Percentile - Normative  Descriptor > 98 - Exceptionally High 91-97 - Well Above Average 75-90 - Above Average 25-74 - Average  9-24 - Below Average 2-8 - Well Below Average < 2 - Exceptionally Low       Orientation:      Raw Score Percentile   NAB Orientation, Form 1 28/29 --- ---       Cognitive Screening:      Raw Score Percentile   SLUMS: 20/30 --- ---       RBANS, Form A: Standard Score/ Scaled Score Percentile   Total Score 93 32 Average  Immediate Memory 94 34 Average    List Learning 9 37 Average    Story Memory 9 37 Average  Visuospatial/Constructional 102 55 Average    Figure Copy 14 91 Well Above Average    Line Orientation 13/20 10-16 Below Average  Language 85 16 Below Average    Picture Naming 9/10 26-50 Average    Semantic Fluency 4 2 Well Below Average  Attention 88 21 Below Average    Digit Span 8 25 Average    Coding 8 25 Average  Delayed Memory 110 75 Above Average    List Recall 5/10 51-75 Average    List Recognition 20/20 51-75 Average    Story Recall 13 84 Above Average    Story Recognition 11/12 47-68 Average    Figure Recall 12 75 Above Average    Figure Recognition 7/8 53-69 Average        Intellectual Functioning:      Standard Score Percentile   Test of Premorbid Functioning: 96 39 Average       Attention/Executive Function:     Trail Making Test (TMT): Raw Score (T Score) Percentile     Part A 24 secs.,  0 errors (68) 96 Well Above Average    Part Chandler 95 secs.,  0 errors (57) 75 Above Average         Scaled Score Percentile   WAIS-IV Digit Span: 11 63 Average    Forward 10 50 Average    Backward 13 84 Above Average    Sequencing 9 37 Average        Scaled Score Percentile   WAIS-IV Similarities: 9 37 Average       D-KEFS Color-Word Interference Test: Raw Score (Scaled Score) Percentile     Color Naming 31 secs. (11) 63 Average    Word Reading 22 secs. (12) 75 Above Average    Inhibition 83 secs. (8) 25 Average      Total Errors 1 errors (12) 75 Above  Average    Inhibition/Switching 77 secs. (10) 50 Average      Total Errors 1 errors (12) 75 Above Average       D-KEFS Verbal Fluency Test: Raw Score (Scaled Score) Percentile     Letter Total Correct 49 (14) 91 Well Above Average    Category Total Correct 24 (6) 9 Below Average    Category Switching Total Correct 11 (9) 37 Average    Category Switching Accuracy 10 (9) 37 Average      Total Set Loss Errors 3 (9) 37 Average      Total Repetition Errors 1 (12) 75 Above Average       NAB Executive Functions Module, Form 1: T Score Percentile     Judgment 56 73 Average       Language:     Verbal Fluency Test: Raw Score (T Score) Percentile     Phonemic Fluency (FAS) 49 (63) 91 Well Above Average    Animal Fluency 14 (47) 38 Average  NAB Language Module, Form 1: T Score Percentile     Auditory Comprehension 57 75 Above Average    Naming 30/31 (57) 75 Above Average       Visuospatial/Visuoconstruction:      Raw Score Percentile   Clock Drawing: 8/10 --- Within Normal Limits        Scaled Score Percentile   WAIS-IV Block Design: 9 37 Average       Mood and Personality:      Raw Score Percentile   Geriatric Depression Scale: 5 --- Within Normal Limits  Geriatric Anxiety Scale: 17 --- Mild    Somatic 8 --- Mild    Cognitive 5 --- Mild    Affective 4 --- Mild       Additional Questionnaires:      Raw Score Percentile   PROMIS Sleep Disturbance Questionnaire: 24 --- None to Slight   Informed Consent and Coding/Compliance:   The current evaluation represents a clinical evaluation for the purposes previously outlined by the referral source and is in no way reflective of a forensic evaluation.   Mr. Yatsko was provided with a verbal description of the nature and purpose of the present neuropsychological evaluation. Also reviewed were the foreseeable risks and/or discomforts and benefits of the procedure, limits of confidentiality, and mandatory reporting requirements of this  provider. The patient was given the opportunity to ask questions and receive answers about the evaluation. Oral consent to participate was provided by the patient.   This evaluation was conducted by Newman Nickels, Ph.D., ABPP-CN, board certified clinical neuropsychologist. Mr. Duke completed a clinical interview with Dr. Milbert Coulter, billed as one unit [id number], and 135 minutes of cognitive testing and scoring, billed as one unit [phone] and four additional units [id number]. Psychometrist Wallace Keller, Chandler.S., assisted Dr. Milbert Coulter with test administration and scoring procedures. As a separate and discrete service, one unit [id number] and two units [id number] were billed for Dr. Tammy Sours time spent in interpretation and report writing.

## 2022-09-13 ENCOUNTER — Encounter: Payer: MEDICARE | Admitting: Psychology

## 2022-09-15 ENCOUNTER — Ambulatory Visit (INDEPENDENT_AMBULATORY_CARE_PROVIDER_SITE_OTHER): Payer: Medicare HMO | Admitting: Psychology

## 2022-09-15 DIAGNOSIS — R4189 Other symptoms and signs involving cognitive functions and awareness: Secondary | ICD-10-CM | POA: Diagnosis not present

## 2022-09-15 DIAGNOSIS — F4312 Post-traumatic stress disorder, chronic: Secondary | ICD-10-CM

## 2022-09-15 NOTE — Progress Notes (Signed)
   Neuropsychology Feedback Session Calvin Chandler. [facility] [place] Department of Neurology  Reason for Referral:   Calvin Chandler is a 77 y.o. right-handed African-American male referred by Calvin Kays, PA-C, to characterize his current cognitive functioning and assist with diagnostic clarity and treatment planning in the context of subjective cognitive decline.   Feedback:   Calvin Chandler completed a comprehensive neuropsychological evaluation on 09/02/2022. Please refer to that encounter for the full report and recommendations. Briefly, results suggested neuropsychological functioning within normal limits relative to age-matched peers. Outside of slight variability across semantic fluency tasks, performances were appropriate across all assessed cognitive domains. Day-to-day cognitive lapses are likely influenced by chronic medical ailments (including potentially poorly managed type II diabetes), untreated obstructive sleep apnea, and psychiatric distress (favoring PTSD and anxiety-based symptoms). Calvin Chandler wife described some personality changes surrounding emotional outbursts and loud, anger filled moments where she can feel less safe in her surroundings. At this point in time, based upon intact cognitive functioning, this may be most directly related to untreated PTSD and other psychosocial stressors. Calvin Chandler himself stated that PTSD symptoms are present daily, poorly managed, and may be worsening. Emotional outbursts can be common with this condition. Neurologically speaking, the behavioral variant of frontotemporal dementia (bvFTD) can cause prominent personality changes. However, Calvin Chandler age is somewhat advanced for when this illness normally presents, testing did not suggest concerning patterns of performance, and recent neuroimaging revealed generalized patterns of atrophy. Overall, the likelihood of this neurological illness remains low given currently available information.    Calvin Chandler was accompanied by his wife and daughter during the current feedback session. Content of the current session focused on the results of his neuropsychological evaluation. Calvin Chandler was given the opportunity to ask questions and his questions were answered. He was encouraged to reach out should additional questions arise. A copy of his report was provided at the conclusion of the visit.      One unit [phone] was billed for Dr. Tammy Sours time spent preparing for, conducting, and documenting the current feedback session with Calvin Chandler.

## 2022-11-01 ENCOUNTER — Telehealth: Payer: Self-pay | Admitting: Family Medicine

## 2022-11-01 NOTE — Telephone Encounter (Signed)
Copied from CRM [id number]. Topic: Medicare AWV >> Nov 01, 2022  2:01 PM Payton Doughty wrote: Reason for CRM: LM 11/01/2022 to schedule AWV   Verlee Rossetti; Care Guide Ambulatory Clinical Support [place] [facility] Direct Dial: [phone]

## 2022-11-03 ENCOUNTER — Telehealth: Payer: Self-pay

## 2022-11-03 NOTE — Telephone Encounter (Signed)
Reached out to patient to set up follow up visit or physical with provider to discuss chronic conditions.  Telephone encounter attempt : 1   A HIPAA compliant voice message was left requesting a return call.  Instructed patient to call office or to call me at [phone].  Calvin Chandler [facility]

## 2022-12-29 DIAGNOSIS — Z008 Encounter for other general examination: Secondary | ICD-10-CM | POA: Diagnosis not present

## 2023-01-27 ENCOUNTER — Other Ambulatory Visit (HOSPITAL_COMMUNITY): Payer: Self-pay

## 2023-01-28 ENCOUNTER — Other Ambulatory Visit (HOSPITAL_COMMUNITY): Payer: Self-pay

## 2023-02-17 ENCOUNTER — Encounter: Payer: Medicare HMO | Admitting: Family Medicine

## 2023-03-18 ENCOUNTER — Other Ambulatory Visit (HOSPITAL_BASED_OUTPATIENT_CLINIC_OR_DEPARTMENT_OTHER): Payer: Self-pay

## 2023-03-18 MED ORDER — COVID-19 MRNA VAC-TRIS(PFIZER) 30 MCG/0.3ML IM SUSY
0.3000 mL | PREFILLED_SYRINGE | Freq: Once | INTRAMUSCULAR | 0 refills | Status: AC
  Filled 2023-03-18: qty 0.3, 1d supply, fill #0

## 2023-04-18 LAB — HM DIABETES EYE EXAM

## 2023-04-28 ENCOUNTER — Encounter: Payer: Medicare HMO | Admitting: Family Medicine

## 2023-07-13 ENCOUNTER — Telehealth: Payer: Self-pay | Admitting: Emergency Medicine

## 2023-07-13 NOTE — Telephone Encounter (Signed)
 Copied from CRM [id number]. Topic: Clinical - Prescription Issue >> Jul 13, 2023  4:31 PM Melissa C wrote: Reason for CRM: rescheduled patient's appointment with Dr. Laury Axon for May per patient's request and during conversation patient stated that he was in need of a refill. He kept insisting that the medication was for (this is his pronunciation of is) Osalmic- but he doesn't have that medication, nor do I think there is a medication like that which exists. I believe patient is talking about his Semaglutide,0.25 or 0.5MG /DOS, 2 MG/3ML SOPN- Ozempic.

## 2023-07-14 ENCOUNTER — Encounter: Payer: Medicare (Managed Care) | Admitting: Family Medicine

## 2023-07-14 ENCOUNTER — Other Ambulatory Visit: Payer: Self-pay | Admitting: Family Medicine

## 2023-07-14 NOTE — Telephone Encounter (Signed)
 Okay to refill? Last refill date was in 2023

## 2023-11-07 DIAGNOSIS — M160 Bilateral primary osteoarthritis of hip: Secondary | ICD-10-CM | POA: Diagnosis not present

## 2023-11-07 DIAGNOSIS — M170 Bilateral primary osteoarthritis of knee: Secondary | ICD-10-CM | POA: Diagnosis not present

## 2024-01-25 ENCOUNTER — Telehealth: Payer: Self-pay

## 2024-01-25 NOTE — Telephone Encounter (Signed)
 Patient was identified as falling into the [place] Measure - Diabetes.   Patient was: Requires a call back at a later time. Voicemail not available. Mychart message sent to patient requesting call back to schedule f/u appointment with PCP.

## 2024-02-24 ENCOUNTER — Encounter: Payer: Self-pay | Admitting: Family Medicine

## 2024-02-24 ENCOUNTER — Ambulatory Visit (INDEPENDENT_AMBULATORY_CARE_PROVIDER_SITE_OTHER): Payer: Medicare (Managed Care) | Admitting: Family Medicine

## 2024-02-24 VITALS — BP 118/70 | HR 90 | Temp 97.9°F | Resp 16 | Ht 69.0 in

## 2024-02-24 DIAGNOSIS — E66812 Obesity, class 2: Secondary | ICD-10-CM

## 2024-02-24 DIAGNOSIS — J441 Chronic obstructive pulmonary disease with (acute) exacerbation: Secondary | ICD-10-CM

## 2024-02-24 DIAGNOSIS — Z23 Encounter for immunization: Secondary | ICD-10-CM

## 2024-02-24 DIAGNOSIS — E559 Vitamin D deficiency, unspecified: Secondary | ICD-10-CM | POA: Diagnosis not present

## 2024-02-24 DIAGNOSIS — E1169 Type 2 diabetes mellitus with other specified complication: Secondary | ICD-10-CM

## 2024-02-24 DIAGNOSIS — Z0000 Encounter for general adult medical examination without abnormal findings: Secondary | ICD-10-CM

## 2024-02-24 DIAGNOSIS — G8929 Other chronic pain: Secondary | ICD-10-CM

## 2024-02-24 DIAGNOSIS — M25511 Pain in right shoulder: Secondary | ICD-10-CM | POA: Diagnosis not present

## 2024-02-24 DIAGNOSIS — R351 Nocturia: Secondary | ICD-10-CM

## 2024-02-24 DIAGNOSIS — Z794 Long term (current) use of insulin: Secondary | ICD-10-CM

## 2024-02-24 DIAGNOSIS — Z6836 Body mass index (BMI) 36.0-36.9, adult: Secondary | ICD-10-CM | POA: Diagnosis not present

## 2024-02-24 DIAGNOSIS — E785 Hyperlipidemia, unspecified: Secondary | ICD-10-CM | POA: Diagnosis not present

## 2024-02-24 DIAGNOSIS — G4733 Obstructive sleep apnea (adult) (pediatric): Secondary | ICD-10-CM

## 2024-02-24 DIAGNOSIS — I700 Atherosclerosis of aorta: Secondary | ICD-10-CM

## 2024-02-24 DIAGNOSIS — R051 Acute cough: Secondary | ICD-10-CM

## 2024-02-24 DIAGNOSIS — E1165 Type 2 diabetes mellitus with hyperglycemia: Secondary | ICD-10-CM

## 2024-02-24 DIAGNOSIS — Z0001 Encounter for general adult medical examination with abnormal findings: Secondary | ICD-10-CM

## 2024-02-24 LAB — LIPID PANEL
Cholesterol: 182 mg/dL (ref 0–200)
HDL: 58.8 mg/dL (ref >39.00)
LDL Cholesterol: 101 mg/dL — ABNORMAL HIGH (ref 0–99)
NonHDL: 122.94
Total CHOL/HDL Ratio: 3
Triglycerides: 110 mg/dL (ref 0.0–149.0)
VLDL: 22 mg/dL (ref 0.0–40.0)

## 2024-02-24 LAB — CBC WITH DIFFERENTIAL/PLATELET
Basophils Absolute: 0.1 K/uL (ref 0.0–0.1)
Basophils Relative: 2 % (ref 0.0–3.0)
Eosinophils Absolute: 0.2 K/uL (ref 0.0–0.7)
Eosinophils Relative: 5.9 % — ABNORMAL HIGH (ref 0.0–5.0)
HCT: 42.6 % (ref 39.0–52.0)
Hemoglobin: 14.5 g/dL (ref 13.0–17.0)
Lymphocytes Relative: 28 % (ref 12.0–46.0)
Lymphs Abs: 0.8 K/uL (ref 0.7–4.0)
MCHC: 34 g/dL (ref 30.0–36.0)
MCV: 84.9 fl (ref 78.0–100.0)
Monocytes Absolute: 0.4 K/uL (ref 0.1–1.0)
Monocytes Relative: 15.7 % — ABNORMAL HIGH (ref 3.0–12.0)
Neutro Abs: 1.3 K/uL — ABNORMAL LOW (ref 1.4–7.7)
Neutrophils Relative %: 48.4 % (ref 43.0–77.0)
Platelets: 212 K/uL (ref 150.0–400.0)
RBC: 5.02 Mil/uL (ref 4.22–5.81)
RDW: 13.5 % (ref 11.5–15.5)
WBC: 2.8 K/uL — ABNORMAL LOW (ref 4.0–10.5)

## 2024-02-24 LAB — COMPREHENSIVE METABOLIC PANEL WITH GFR
ALT: 11 U/L (ref 0–53)
AST: 19 U/L (ref 0–37)
Albumin: 4.1 g/dL (ref 3.5–5.2)
Alkaline Phosphatase: 76 U/L (ref 39–117)
BUN: 11 mg/dL (ref 6–23)
CO2: 30 meq/L (ref 19–32)
Calcium: 9 mg/dL (ref 8.4–10.5)
Chloride: 101 meq/L (ref 96–112)
Creatinine, Ser: 1.13 mg/dL (ref 0.40–1.50)
GFR: 63.17 mL/min (ref >60.00)
Glucose, Bld: 231 mg/dL — ABNORMAL HIGH (ref 70–99)
Potassium: 4.2 meq/L (ref 3.5–5.1)
Sodium: 137 meq/L (ref 135–145)
Total Bilirubin: 0.9 mg/dL (ref 0.2–1.2)
Total Protein: 7 g/dL (ref 6.0–8.3)

## 2024-02-24 LAB — MICROALBUMIN / CREATININE URINE RATIO
Creatinine,U: 222.3 mg/dL
Microalb Creat Ratio: 16.1 mg/g (ref 0.0–30.0)
Microalb, Ur: 3.6 mg/dL — ABNORMAL HIGH (ref 0.0–1.9)

## 2024-02-24 LAB — PSA: PSA: 0.76 ng/mL (ref 0.10–4.00)

## 2024-02-24 LAB — POC COVID19 BINAXNOW: SARS Coronavirus 2 Ag: NEGATIVE

## 2024-02-24 MED ORDER — BLOOD GLUCOSE TEST VI STRP
1.0000 | ORAL_STRIP | 0 refills | Status: AC
Start: 2024-02-24 — End: ?

## 2024-02-24 MED ORDER — LANCETS MISC: 1.0000 | 0 refills | Status: AC

## 2024-02-24 MED ORDER — LANCET DEVICE MISC
1.0000 | 0 refills | Status: AC
Start: 2024-02-24 — End: ?

## 2024-02-24 MED ORDER — BLOOD GLUCOSE MONITORING SUPPL DEVI: 1.0000 | 0 refills | Status: AC

## 2024-02-24 NOTE — Patient Instructions (Signed)
 Preventive Care 73 Years and Older, Male Preventive care refers to lifestyle choices and visits with your health care provider that can promote health and wellness. Preventive care visits are also called wellness exams. What can I expect for my preventive care visit? Counseling During your preventive care visit, your health care provider may ask about your: Medical history, including: Past medical problems. Family medical history. History of falls. Current health, including: Emotional well-being. Home life and relationship well-being. Sexual activity. Memory and ability to understand (cognition). Lifestyle, including: Alcohol, nicotine or tobacco, and drug use. Access to firearms. Diet, exercise, and sleep habits. Work and work Astronomer. Sunscreen use. Safety issues such as seatbelt and bike helmet use. Physical exam Your health care provider will check your: Height and weight. These may be used to calculate your BMI (body mass index). BMI is a measurement that tells if you are at a healthy weight. Waist circumference. This measures the distance around your waistline. This measurement also tells if you are at a healthy weight and may help predict your risk of certain diseases, such as type 2 diabetes and high blood pressure. Heart rate and blood pressure. Body temperature. Skin for abnormal spots. What immunizations do I need?  Vaccines are usually given at various ages, according to a schedule. Your health care provider will recommend vaccines for you based on your age, medical history, and lifestyle or other factors, such as travel or where you work. What tests do I need? Screening Your health care provider may recommend screening tests for certain conditions. This may include: Lipid and cholesterol levels. Diabetes screening. This is done by checking your blood sugar (glucose) after you have not eaten for a while (fasting). Hepatitis C test. Hepatitis B test. HIV (human  immunodeficiency virus) test. STI (sexually transmitted infection) testing, if you are at risk. Lung cancer screening. Colorectal cancer screening. Prostate cancer screening. Abdominal aortic aneurysm (AAA) screening. You may need this if you are a current or former smoker. Talk with your health care provider about your test results, treatment options, and if necessary, the need for more tests. Follow these instructions at home: Eating and drinking  Eat a diet that includes fresh fruits and vegetables, whole grains, lean protein, and low-fat dairy products. Limit your intake of foods with high amounts of sugar, saturated fats, and salt. Take vitamin and mineral supplements as recommended by your health care provider. Do not drink alcohol if your health care provider tells you not to drink. If you drink alcohol: Limit how much you have to 0-2 drinks a day. Know how much alcohol is in your drink. In the [place] one drink equals one 12 oz bottle of beer (355 mL), one 5 oz glass of wine (148 mL), or one 1 oz glass of hard liquor (44 mL). Lifestyle Brush your teeth every morning and night with fluoride toothpaste. Floss one time each day. Exercise for at least 30 minutes 5 or more days each week. Do not use any products that contain nicotine or tobacco. These products include cigarettes, chewing tobacco, and vaping devices, such as e-cigarettes. If you need help quitting, ask your health care provider. Do not use drugs. If you are sexually active, practice safe sex. Use a condom or other form of protection to prevent STIs. Take aspirin only as told by your health care provider. Make sure that you understand how much to take and what form to take. Work with your health care provider to find out whether it is safe  and beneficial for you to take aspirin daily. Ask your health care provider if you need to take a cholesterol-lowering medicine (statin). Find healthy ways to manage stress, such  as: Meditation, yoga, or listening to music. Journaling. Talking to a trusted person. Spending time with friends and family. Safety Always wear your seat belt while driving or riding in a vehicle. Do not drive: If you have been drinking alcohol. Do not ride with someone who has been drinking. When you are tired or distracted. While texting. If you have been using any mind-altering substances or drugs. Wear a helmet and other protective equipment during sports activities. If you have firearms in your house, make sure you follow all gun safety procedures. Minimize exposure to UV radiation to reduce your risk of skin cancer. What's next? Visit your health care provider once a year for an annual wellness visit. Ask your health care provider how often you should have your eyes and teeth checked. Stay up to date on all vaccines. This information is not intended to replace advice given to you by your health care provider. Make sure you discuss any questions you have with your health care provider. Document Revised: 09/24/2020 Document Reviewed: 09/24/2020 Elsevier Patient Education  2024 [employer].

## 2024-02-24 NOTE — Progress Notes (Signed)
 46  Subjective:    Patient ID: Calvin Chandler, male    DOB: [birth year], 78 y.o.   MRN: [id number]  Chief Complaint  Patient presents with   Annual Exam    Pt states fasting    Cough    X3 days, non productive cough, headache, scratchy throat. Pt states trying nyquil     HPI Patient is in today for cpe and f/u dm, chol , migraines .    No new complaints   Past Medical History:  Diagnosis Date   Acute bronchitis 08/03/2007   Acute pain of right knee 03/07/2017   Allergic rhinitis    Arthritis    knees, hands, back , hips   Bilateral hearing loss 11/21/2021   Cataract    forming   Chest pain, atypical 01/23/2008   Chronic post-traumatic stress disorder    Chronic right shoulder pain 11/29/2019   Class 2 severe obesity with serious comorbidity and body mass index (BMI) of 36.0 to 36.9 in adult, unspecified obesity type 02/19/2022   Constipation 02/27/2020   Dizziness 01/18/2008   Dysthymia 10/16/2007   Edema 12/05/2007   Generalized anxiety disorder 10/16/2007   Hyperlipidemia associated with type 2 diabetes mellitus 12/27/2019   Left flank pain 04/24/2013   OSA (obstructive sleep apnea) 01/11/2018   no cpap   Right calf pain 03/07/2017   Type 2 diabetes mellitus without complication, without long-term current use of insulin 12/27/2019   Vitamin D  deficiency 01/28/2020    Past Surgical History:  Procedure Laterality Date   COLONOSCOPY  10/26/2017   COLONOSCOPY WITH PROPOFOL   10/30/2021   EYE SURGERY     POLYPECTOMY     WISDOM TOOTH EXTRACTION      Family History  Problem Relation Age of Onset   Heart disease Mother    Heart failure Mother    Dementia Mother    Prostate cancer Father    Pulmonary embolism Father    Stomach cancer Sister    Breast cancer Sister    Transient ischemic attack Sister    Cancer Sister        lymph node cancer   Transient ischemic attack Sister    Clotting disorder Sister    Hypertension Other    Stroke Other    Clotting  disorder Other        Thromboembolism clotting disorder   Colon cancer Neg Hx    Rectal cancer Neg Hx    Pancreatic cancer Neg Hx    Esophageal cancer Neg Hx    Liver cancer Neg Hx    Colon polyps Neg Hx     Social History   Socioeconomic History   Marital status: Married    Spouse name: Not on file   Number of children: 3   Years of education: 14   Highest education level: Some college, no degree  Occupational History   Occupation: Retired    Comment: Electronics Engineer; transportation/bus driver  Tobacco Use   Smoking status: Former    Current packs/day: 0.00    Average packs/day: 3.0 packs/day for 13.0 years (39.0 ttl pk-yrs)    Types: Cigarettes    Start date: 03/03/1968    Quit date: 03/03/1981    Years since quitting: 43.0   Smokeless tobacco: Never  Vaping Use   Vaping status: Never Used  Substance and Sexual Activity   Alcohol use: Yes    Alcohol/week: 7.0 standard drinks of alcohol    Types: 7 Standard drinks or equivalent per week  Drug use: No   Sexual activity: Yes    Partners: Female  Other Topics Concern   Not on file  Social History Narrative   Right handed   Drinks caffeine   One story home   Social Drivers of Health   Financial Resource Strain: Not on file  Food Insecurity: Not on file  Transportation Needs: Not on file  Physical Activity: Not on file  Stress: Not on file  Social Connections: Unknown (08/20/2021)   Received from [facility]   Social Network    Social Network: Not on file  Intimate Partner Violence: Unknown (07/17/2021)   Received from [facility]   HITS    Physically Hurt: Not on file    Insult or Talk Down To: Not on file    Threaten Physical Harm: Not on file    Scream or Curse: Not on file    Outpatient Medications Prior to Visit  Medication Sig Dispense Refill   donepezil  (ARICEPT ) 5 MG tablet Take 1 tablet (5 mg total) by mouth at bedtime. 30 tablet 11   Multiple Vitamins-Minerals (MULTIVITAMIN MEN 50+ PO) Take by mouth.      polyethylene glycol (MIRALAX ) 17 g packet Take 17 g by mouth daily. prn constipation 14 each 0   Semaglutide ,0.25 or 0.5MG /DOS, 2 MG/3ML SOPN Inject 0.5 mg into the skin once a week. 3 mL 3   atorvastatin  (LIPITOR) 10 MG tablet Take 1 tablet (10 mg total) by mouth daily. 90 tablet 3   COVID-19 mRNA vaccine 2023-2024 (COMIRNATY ) syringe Inject into the muscle. 0.3 mL 0   RSV vaccine recomb adjuvanted (AREXVY ) 120 MCG/0.5ML injection Inject into the muscle. 0.5 mL 0   No facility-administered medications prior to visit.    Allergies  Allergen Reactions   Metformin  And Related Diarrhea    Review of Systems  Constitutional:  Negative for chills, fever and malaise/fatigue.  HENT:  Negative for congestion and hearing loss.   Eyes:  Negative for blurred vision and discharge.  Respiratory:  Negative for cough, sputum production and shortness of breath.   Cardiovascular:  Negative for chest pain, palpitations and leg swelling.  Gastrointestinal:  Negative for abdominal pain, blood in stool, constipation, diarrhea, heartburn, nausea and vomiting.  Genitourinary:  Negative for dysuria, frequency, hematuria and urgency.  Musculoskeletal:  Negative for back pain, falls and myalgias.  Skin:  Negative for rash.  Neurological:  Negative for dizziness, sensory change, loss of consciousness, weakness and headaches.  Endo/Heme/Allergies:  Negative for environmental allergies. Does not bruise/bleed easily.  Psychiatric/Behavioral:  Negative for depression and suicidal ideas. The patient is not nervous/anxious and does not have insomnia.        Objective:    Physical Exam Vitals and nursing note reviewed.  Constitutional:      General: He is not in acute distress.    Appearance: Normal appearance. He is well-developed.  HENT:     Head: Normocephalic and atraumatic.     Right Ear: Tympanic membrane, ear canal and external ear normal. There is no impacted cerumen.     Left Ear: Tympanic  membrane, ear canal and external ear normal. There is no impacted cerumen.     Nose: Nose normal.     Mouth/Throat:     Mouth: Mucous membranes are moist.     Pharynx: Oropharynx is clear. No oropharyngeal exudate or posterior oropharyngeal erythema.  Eyes:     General: No scleral icterus.       Right eye: No discharge.  Left eye: No discharge.     Conjunctiva/sclera: Conjunctivae normal.     Pupils: Pupils are equal, round, and reactive to light.  Neck:     Thyroid: No thyromegaly.     Vascular: No JVD.  Cardiovascular:     Rate and Rhythm: Normal rate and regular rhythm.     Heart sounds: Normal heart sounds. No murmur heard. Pulmonary:     Effort: Pulmonary effort is normal. No respiratory distress.     Breath sounds: Normal breath sounds.  Abdominal:     General: Bowel sounds are normal. There is no distension.     Palpations: Abdomen is soft. There is no mass.     Tenderness: There is no abdominal tenderness. There is no guarding or rebound.  Musculoskeletal:        General: Normal range of motion.     Cervical back: Normal range of motion and neck supple.     Right lower leg: No edema.     Left lower leg: No edema.  Lymphadenopathy:     Cervical: No cervical adenopathy.  Skin:    General: Skin is warm and dry.     Findings: No erythema or rash.  Neurological:     Mental Status: He is alert and oriented to person, place, and time.     Cranial Nerves: No cranial nerve deficit.     Motor: No abnormal muscle tone.     Deep Tendon Reflexes: Reflexes are normal and symmetric. Reflexes normal.  Psychiatric:        Mood and Affect: Mood normal.        Behavior: Behavior normal.        Thought Content: Thought content normal.        Judgment: Judgment normal.     BP 118/70 (BP Location: Left Arm, Patient Position: Sitting, Cuff Size: Large)   Pulse 90   Temp 97.9 F (36.6 C) (Oral)   Resp 16   Ht 5' 9 (1.753 m)   SpO2 97%   BMI 35.29 kg/m  Wt Readings from  Last 3 Encounters:  02/19/22 239 lb (108.4 kg)  01/06/22 239 lb (108.4 kg)  11/30/21 239 lb (108.4 kg)    Diabetic Foot Exam - Simple   No data filed    Lab Results  Component Value Date   WBC 2.8 (L) 02/24/2024   HGB 14.5 02/24/2024   HCT 42.6 02/24/2024   PLT 212.0 02/24/2024   GLUCOSE 231 (H) 02/24/2024   CHOL 182 02/24/2024   TRIG 110.0 02/24/2024   HDL 58.80 02/24/2024   LDLDIRECT 114.4 08/24/2006   LDLCALC 101 (H) 02/24/2024   ALT 11 02/24/2024   AST 19 02/24/2024   NA 137 02/24/2024   K 4.2 02/24/2024   CL 101 02/24/2024   CREATININE 1.13 02/24/2024   BUN 11 02/24/2024   CO2 30 02/24/2024   TSH 1.09 11/30/2021   PSA 0.76 02/24/2024   HGBA1C 12.0 (H) 02/24/2024   MICROALBUR 3.6 (H) 02/24/2024    Lab Results  Component Value Date   TSH 1.09 11/30/2021   Lab Results  Component Value Date   WBC 2.8 (L) 02/24/2024   HGB 14.5 02/24/2024   HCT 42.6 02/24/2024   MCV 84.9 02/24/2024   PLT 212.0 02/24/2024   Lab Results  Component Value Date   NA 137 02/24/2024   K 4.2 02/24/2024   CO2 30 02/24/2024   GLUCOSE 231 (H) 02/24/2024   BUN 11 02/24/2024   CREATININE 1.13  02/24/2024   BILITOT 0.9 02/24/2024   ALKPHOS 76 02/24/2024   AST 19 02/24/2024   ALT 11 02/24/2024   PROT 7.0 02/24/2024   ALBUMIN 4.1 02/24/2024   CALCIUM  9.0 02/24/2024   GFR 63.17 02/24/2024   Lab Results  Component Value Date   CHOL 182 02/24/2024   Lab Results  Component Value Date   HDL 58.80 02/24/2024   Lab Results  Component Value Date   LDLCALC 101 (H) 02/24/2024   Lab Results  Component Value Date   TRIG 110.0 02/24/2024   Lab Results  Component Value Date   CHOLHDL 3 02/24/2024   Lab Results  Component Value Date   HGBA1C 12.0 (H) 02/24/2024       Assessment & Plan:  Preventative health care Assessment & Plan: Ghm utd Check labs  See AVS Health Maintenance  Topic Date Due   Medicare Annual Wellness (AWV)  04/24/2014   Zoster Vaccines- Shingrix  (2  of 2) 01/12/2022   FOOT EXAM  11/18/2022   COVID-19 Vaccine (6 - 2025-26 season) 12/12/2023   Influenza Vaccine  07/10/2024 (Originally 11/11/2023)   OPHTHALMOLOGY EXAM  04/17/2024   HEMOGLOBIN A1C  08/23/2024   Colonoscopy  10/30/2024   Diabetic kidney evaluation - eGFR measurement  02/23/2025   Diabetic kidney evaluation - Urine ACR  02/23/2025   DTaP/Tdap/Td (6 - Td or Tdap) 10/05/2027   Pneumococcal Vaccine: 50+ Years  Completed   Hepatitis C Screening  Completed   Meningococcal B Vaccine  Aged Out      Aortic atherosclerosis Assessment & Plan: Check labs     Asthma exacerbation in COPD [facility]) Assessment & Plan: stable   Vitamin D  deficiency  Type 2 diabetes mellitus with hyperglycemia, without long-term current use of insulin (HCC) Assessment & Plan: hgba1c to be checked  minimize simple carbs. Increase exercise as tolerated. Continue current meds   Orders: -     Lipid panel -     Comprehensive metabolic panel with GFR -     Hemoglobin A1c -     Microalbumin / creatinine urine ratio -     Blood Glucose Monitoring Suppl; 1 each by Does not apply route as directed. Dispense based on patient and insurance preference. Use up to four times daily as directed. (FOR ICD-10 E10.9, E11.9).  Dispense: 1 each; Refill: 0 -     Blood Glucose Test; 1 each by Does not apply route as directed. Dispense based on patient and insurance preference. Use up to four times daily as directed. (FOR ICD-10 E10.9, E11.9).  Dispense: 100 strip; Refill: 0 -     Lancet Device; 1 each by Does not apply route as directed. Dispense based on patient and insurance preference. Use up to four times daily as directed. (FOR ICD-10 E10.9, E11.9).  Dispense: 1 each; Refill: 0 -     Lancets; 1 each by Does not apply route as directed. Dispense based on patient and insurance preference. Use up to four times daily as directed. (FOR ICD-10 E10.9, E11.9).  Dispense: 100 each; Refill: 0  Nocturia -     CBC with  Differential/Platelet -     PSA  OSA (obstructive sleep apnea) -     Pulmonary Visit  Acute cough -     POC COVID-19 BinaxNow  Chronic right shoulder pain Assessment & Plan: stable   Need for immunization against influenza -     Pneumococcal conjugate vaccine 20-valent  Hyperlipidemia associated with type 2 diabetes mellitus Assessment &  Plan: Encourage heart healthy diet such as MIND or DASH diet, increase exercise, avoid trans fats, simple carbohydrates and processed foods, consider a krill or fish or flaxseed oil cap daily.     Obesity, Class II, BMI 35-39.9  Class 2 severe obesity with serious comorbidity and body mass index (BMI) of 36.0 to 36.9 in adult, unspecified obesity type Assessment & Plan: Pt with diabetes II   Assessment and Plan Assessment & Plan     Jamee JONELLE Antonio Cyndee, DO

## 2024-02-27 LAB — HEMOGLOBIN A1C: Hgb A1c MFr Bld: 12 % — ABNORMAL HIGH (ref 4.6–6.5)

## 2024-02-28 ENCOUNTER — Other Ambulatory Visit: Payer: Self-pay | Admitting: Family Medicine

## 2024-02-28 NOTE — Telephone Encounter (Unsigned)
 Copied from CRM [id number]. Topic: Clinical - Medication Refill >> Feb 28, 2024  4:56 PM Jasmin G wrote: Medication: Semaglutide ,0.25 or 0.5MG /DOS, 2 MG/3ML SOPN  Has the patient contacted their pharmacy? No (Agent: If no, request that the patient contact the pharmacy for the refill. If patient does not wish to contact the pharmacy document the reason why and proceed with request.) (Agent: If yes, when and what did the pharmacy advise?)  This is the patient's preferred pharmacy:  [facility] [address], [place] - [address] [place] [address] Phone: [phone] Fax: [phone]  Is this the correct pharmacy for this prescription? Yes If no, delete pharmacy and type the correct one.   Has the prescription been filled recently? No  Is the patient out of the medication? Yes  Has the patient been seen for an appointment in the last year OR does the patient have an upcoming appointment? Yes  Can we respond through MyChart? No  Agent: Please be advised that Rx refills may take up to 3 business days. We ask that you follow-up with your pharmacy.

## 2024-03-01 ENCOUNTER — Ambulatory Visit: Payer: Self-pay | Admitting: Family Medicine

## 2024-03-01 DIAGNOSIS — I700 Atherosclerosis of aorta: Secondary | ICD-10-CM | POA: Insufficient documentation

## 2024-03-01 DIAGNOSIS — J441 Chronic obstructive pulmonary disease with (acute) exacerbation: Secondary | ICD-10-CM | POA: Insufficient documentation

## 2024-03-01 DIAGNOSIS — E1165 Type 2 diabetes mellitus with hyperglycemia: Secondary | ICD-10-CM

## 2024-03-01 MED ORDER — SEMAGLUTIDE (1 MG/DOSE) 4 MG/3ML ~~LOC~~ SOPN: 1.0000 mg | PEN_INJECTOR | SUBCUTANEOUS | 3 refills | Status: DC

## 2024-03-01 NOTE — Telephone Encounter (Signed)
 Rx resent.

## 2024-03-01 NOTE — Assessment & Plan Note (Signed)
 stable

## 2024-03-01 NOTE — Assessment & Plan Note (Signed)
 Ghm utd Check labs  See AVS Health Maintenance  Topic Date Due   Medicare Annual Wellness (AWV)  04/24/2014   Zoster Vaccines- Shingrix  (2 of 2) 01/12/2022   FOOT EXAM  11/18/2022   COVID-19 Vaccine (6 - 2025-26 season) 12/12/2023   Influenza Vaccine  07/10/2024 (Originally 11/11/2023)   OPHTHALMOLOGY EXAM  04/17/2024   HEMOGLOBIN A1C  08/23/2024   Colonoscopy  10/30/2024   Diabetic kidney evaluation - eGFR measurement  02/23/2025   Diabetic kidney evaluation - Urine ACR  02/23/2025   DTaP/Tdap/Td (6 - Td or Tdap) 10/05/2027   Pneumococcal Vaccine: 50+ Years  Completed   Hepatitis C Screening  Completed   Meningococcal B Vaccine  Aged Out

## 2024-03-01 NOTE — Telephone Encounter (Signed)
 Copied from CRM #[id number]. Topic: Clinical - Prescription Issue >> Mar 01, 2024  3:11 PM [name] wrote: Reason for CRM: Pt called in about medicaton Semaglutide , 1 MG/DOSE, 4 MG/3ML SOPN   Pls follow up with pt   Pharmacy shows confirmation at 11:05AM - pt said he called in to [facility] and they say they do not have it.    emaglutide, 1 MG/DOSE, 4 MG/3ML SOPN 3 mL 3 03/01/2024 -  Sig - Route: Inject 1 mg as directed once a week. - Injection  Sent to pharmacy as: Semaglutide , 1 MG/DOSE, 4 MG/3ML Solution Pen-injector  E-Prescribing Status: Receipt confirmed by pharmacy (03/01/2024 11:05 AM EST

## 2024-03-01 NOTE — Assessment & Plan Note (Signed)
 Check labs

## 2024-03-02 ENCOUNTER — Telehealth: Payer: Self-pay

## 2024-03-02 NOTE — Telephone Encounter (Signed)
Needs PA please.

## 2024-03-02 NOTE — Telephone Encounter (Signed)
 Copied from CRM #[id number]. Topic: Clinical - Prescription Issue >> Mar 02, 2024  2:15 PM Viola F wrote: Patient called to follow up on the refill for the Semaglutide , 1 MG/DOSE, 4 MG/3ML SOPN - [facility] says they are waiting for the insurance? MG increased to 1mg  so this may need prior authorization? Patient is not sure what is needed and would like a call back with an update. Please call [phone] (M)

## 2024-03-05 ENCOUNTER — Telehealth: Payer: Self-pay

## 2024-03-05 ENCOUNTER — Other Ambulatory Visit (HOSPITAL_COMMUNITY): Payer: Self-pay

## 2024-03-05 NOTE — Telephone Encounter (Signed)
 Pharmacy Patient Advocate Encounter   Received notification from Pt Calls Messages that prior authorization for Ozempic  4mg /69ml is required/requested.   Insurance verification completed.   The patient is insured through [employer].   Per test claim: PA required; PA submitted to above mentioned insurance via Latent Key/confirmation #/EOC [id number] Status is pending

## 2024-03-05 NOTE — Telephone Encounter (Signed)
 Pharmacy Patient Advocate Encounter  Received notification from [employer] that Prior Authorization for Ozempic  4mg /75ml has been APPROVED from 02/04/24 to 03/05/25   PA #/Case ID/Reference #: [id number]

## 2024-03-13 DIAGNOSIS — E1165 Type 2 diabetes mellitus with hyperglycemia: Secondary | ICD-10-CM | POA: Insufficient documentation

## 2024-03-13 DIAGNOSIS — M1711 Unilateral primary osteoarthritis, right knee: Secondary | ICD-10-CM | POA: Diagnosis not present

## 2024-03-13 NOTE — Assessment & Plan Note (Signed)
 Cont diet and exercise

## 2024-03-13 NOTE — Assessment & Plan Note (Signed)
 hgba1c to be checked minimize simple carbs. Increase exercise as tolerated. Continue current meds

## 2024-03-13 NOTE — Assessment & Plan Note (Signed)
 Encourage heart healthy diet such as MIND or DASH diet, increase exercise, avoid trans fats, simple carbohydrates and processed foods, consider a krill or fish or flaxseed oil cap daily.

## 2024-03-13 NOTE — Assessment & Plan Note (Signed)
 stable

## 2024-03-14 ENCOUNTER — Telehealth: Payer: Self-pay

## 2024-03-14 NOTE — Progress Notes (Unsigned)
 Complex Care Management Note Care Guide Note  03/14/2024 Name: Calvin Chandler MRN: [id number] DOB: [birth year]   Complex Care Management Outreach Attempts: An unsuccessful telephone outreach was attempted today to offer the patient information about available complex care management services.  Follow Up Plan:  Additional outreach attempts will be made to offer the patient complex care management information and services.   Encounter Outcome:  No Answer  Dreama [facility]  [facility], [facility] VBCI Assistant Direct Dial: [phone]  Fax: [phone]

## 2024-03-19 ENCOUNTER — Encounter: Payer: Self-pay | Admitting: Family Medicine

## 2024-03-19 NOTE — Assessment & Plan Note (Signed)
 Pt with diabetes II

## 2024-03-20 DIAGNOSIS — M1711 Unilateral primary osteoarthritis, right knee: Secondary | ICD-10-CM | POA: Diagnosis not present

## 2024-03-20 NOTE — Progress Notes (Unsigned)
 Complex Care Management Note Care Guide Note  03/20/2024 Name: Calvin Chandler MRN: [id number] DOB: [birth year]   Complex Care Management Outreach Attempts: A second unsuccessful outreach was attempted today to offer the patient with information about available complex care management services.  Follow Up Plan:  Additional outreach attempts will be made to offer the patient complex care management information and services.   Encounter Outcome:  No Answer  Dreama Lynwood [facility]  [facility], [facility] VBCI Assistant Direct Dial: [phone]  Fax: [phone]

## 2024-03-22 DIAGNOSIS — M6281 Muscle weakness (generalized): Secondary | ICD-10-CM | POA: Diagnosis not present

## 2024-03-22 DIAGNOSIS — R2689 Other abnormalities of gait and mobility: Secondary | ICD-10-CM | POA: Diagnosis not present

## 2024-03-22 DIAGNOSIS — M25661 Stiffness of right knee, not elsewhere classified: Secondary | ICD-10-CM | POA: Diagnosis not present

## 2024-03-22 DIAGNOSIS — M1711 Unilateral primary osteoarthritis, right knee: Secondary | ICD-10-CM | POA: Diagnosis not present

## 2024-03-22 NOTE — Progress Notes (Signed)
 Complex Care Management Note Care Guide Note  03/22/2024 Name: Calvin Chandler MRN: [id number] DOB: 1945-09-28   Complex Care Management Outreach Attempts: A third unsuccessful outreach was attempted today to offer the patient with information about available complex care management services.  Follow Up Plan:  No further outreach attempts will be made at this time. We have been unable to contact the patient to offer or enroll patient in complex care management services.  Encounter Outcome:  No Answer  Dreama Lynwood [facility]  [facility], [facility] VBCI Assistant Direct Dial: [phone]  Fax: [phone]

## 2024-03-28 DIAGNOSIS — R2689 Other abnormalities of gait and mobility: Secondary | ICD-10-CM | POA: Diagnosis not present

## 2024-03-28 DIAGNOSIS — M25661 Stiffness of right knee, not elsewhere classified: Secondary | ICD-10-CM | POA: Diagnosis not present

## 2024-03-28 DIAGNOSIS — M1711 Unilateral primary osteoarthritis, right knee: Secondary | ICD-10-CM | POA: Diagnosis not present

## 2024-03-28 DIAGNOSIS — M6281 Muscle weakness (generalized): Secondary | ICD-10-CM | POA: Diagnosis not present

## 2024-03-30 DIAGNOSIS — M1711 Unilateral primary osteoarthritis, right knee: Secondary | ICD-10-CM | POA: Diagnosis not present

## 2024-03-30 DIAGNOSIS — R2689 Other abnormalities of gait and mobility: Secondary | ICD-10-CM | POA: Diagnosis not present

## 2024-03-30 DIAGNOSIS — M6281 Muscle weakness (generalized): Secondary | ICD-10-CM | POA: Diagnosis not present

## 2024-03-30 DIAGNOSIS — M25661 Stiffness of right knee, not elsewhere classified: Secondary | ICD-10-CM | POA: Diagnosis not present

## 2024-04-02 DIAGNOSIS — R2689 Other abnormalities of gait and mobility: Secondary | ICD-10-CM | POA: Diagnosis not present

## 2024-04-02 DIAGNOSIS — M1711 Unilateral primary osteoarthritis, right knee: Secondary | ICD-10-CM | POA: Diagnosis not present

## 2024-04-02 DIAGNOSIS — M25661 Stiffness of right knee, not elsewhere classified: Secondary | ICD-10-CM | POA: Diagnosis not present

## 2024-04-02 DIAGNOSIS — M6281 Muscle weakness (generalized): Secondary | ICD-10-CM | POA: Diagnosis not present

## 2024-04-04 DIAGNOSIS — R2689 Other abnormalities of gait and mobility: Secondary | ICD-10-CM | POA: Diagnosis not present

## 2024-04-04 DIAGNOSIS — M25661 Stiffness of right knee, not elsewhere classified: Secondary | ICD-10-CM | POA: Diagnosis not present

## 2024-04-04 DIAGNOSIS — M1711 Unilateral primary osteoarthritis, right knee: Secondary | ICD-10-CM | POA: Diagnosis not present

## 2024-04-04 DIAGNOSIS — M6281 Muscle weakness (generalized): Secondary | ICD-10-CM | POA: Diagnosis not present

## 2024-04-11 DIAGNOSIS — M25661 Stiffness of right knee, not elsewhere classified: Secondary | ICD-10-CM | POA: Diagnosis not present

## 2024-04-11 DIAGNOSIS — R2689 Other abnormalities of gait and mobility: Secondary | ICD-10-CM | POA: Diagnosis not present

## 2024-04-11 DIAGNOSIS — M6281 Muscle weakness (generalized): Secondary | ICD-10-CM | POA: Diagnosis not present

## 2024-04-11 DIAGNOSIS — M1711 Unilateral primary osteoarthritis, right knee: Secondary | ICD-10-CM | POA: Diagnosis not present

## 2024-04-23 ENCOUNTER — Encounter: Payer: Self-pay | Admitting: Family Medicine

## 2024-04-23 ENCOUNTER — Other Ambulatory Visit: Payer: Self-pay | Admitting: Family Medicine

## 2024-04-23 DIAGNOSIS — E1165 Type 2 diabetes mellitus with hyperglycemia: Secondary | ICD-10-CM

## 2024-05-02 ENCOUNTER — Telehealth: Payer: Self-pay

## 2024-05-02 NOTE — Telephone Encounter (Signed)
 CRM # [id number] Owner: Abron Leach F Status: Resolved Open  Priority: Routine Created on: 05/02/2024 03:54 PM By: Armanii Urbanik, CMA   Primary Information  Source  Employee   Subject  Employee   Topic  E2C2 Error Reporting - Messaging/Routing Errors    Communication  This CRM was created to begin the process of a messaging/routing error investigation. Please see the corresponding attachments, forms, and notes for details.   Notes  Jannetta Vena SAUNDERS   05/02/2024  4:14 PM  Medication is Semaglutide , 1 MG/DOSE, 4 MG/3ML SOPN       Abron Leach FALCON   05/02/2024  4:03 PM  During Call ID [id number], the patient did not specify which medication was needed, and the agent did not ask for the specific medication name. The CRM was submitted referencing medication without sufficient detail for the clinic to act on.  Corrective Action: The agent will contact the patient to obtain the exact medication name(s) and submit a corrected CRM with complete information.    Eldor Conaway, CMA   05/02/2024  3:54 PM  Need more information. Which medication is this regarding?     Geanna Divirgilio, CMA   05/02/2024  3:54 PM  CRM # [id number] Owner: None Status: Unresolved Open  Priority: Routine Created on: 05/02/2024 03:42 PM By: Jannetta Vena SAUNDERS    Primary Information   Source  [name] (Patient)   Subject  [name]   Topic  Clinical - Prescription Issue    Communication  Reason for CRM: Pt called today because he wanted to see if Dr Antonio Meth could do anything to assist with an issue he is having with his medication. He was initially paying $47 but now the price has gone up to $342 and he cannot afford to pay that. Please call pt to advise    Patient Information   Patient Name Gender DOB SSN  Calvin Chandler, Calvin Chandler [facility] Male 1946-02-12 [id number]    Attachments     User Attached  Department: [address] [place], [place] R 05/02/2024  3:45 PM           Attachments   User Attached  Department: [address] [place]e Zun[place], [place] 05/02/2024  3:54 PM  Patient: [name], [name] [[id number]] Britni Driscoll, [facility] 05/02/2024  3:54 PM   Forms  SmartForms   Messaging/Routing Error Reporting   Error Reporting - Investigation   Resolution  Resolved On Reason By Resolved First Attempt?  05/02/2024 04:01 PM E2C2 Error Reporting - Error, E2C2 Team Member Wilber, Leach F No

## 2024-05-02 NOTE — Telephone Encounter (Signed)
 Cost of ozempic  an issue, he has been referred to endo but what should Pt  do in the meantime.

## 2024-05-03 ENCOUNTER — Other Ambulatory Visit: Payer: Self-pay | Admitting: Family Medicine

## 2024-05-03 MED ORDER — TRULICITY 1.5 MG/0.5ML ~~LOC~~ SOAJ: 1.5000 mg | SUBCUTANEOUS | 1 refills | Status: AC

## 2024-05-10 LAB — OPHTHALMOLOGY REPORT-SCANNED

## 2024-09-04 ENCOUNTER — Ambulatory Visit: Payer: Medicare (Managed Care)
